# Patient Record
Sex: Female | Born: 1970
Health system: Southern US, Community
[De-identification: ages and names within clinical notes are randomized; demographics above are authoritative.]

## PROBLEM LIST (undated history)

## (undated) DIAGNOSIS — F429 Obsessive-compulsive disorder, unspecified: Secondary | ICD-10-CM

## (undated) DIAGNOSIS — K219 Gastro-esophageal reflux disease without esophagitis: Secondary | ICD-10-CM

## (undated) DIAGNOSIS — F419 Anxiety disorder, unspecified: Secondary | ICD-10-CM

## (undated) DIAGNOSIS — N200 Calculus of kidney: Secondary | ICD-10-CM

## (undated) DIAGNOSIS — G8929 Other chronic pain: Secondary | ICD-10-CM

## (undated) DIAGNOSIS — I509 Heart failure, unspecified: Secondary | ICD-10-CM

## (undated) DIAGNOSIS — R140 Abdominal distension (gaseous): Secondary | ICD-10-CM

## (undated) DIAGNOSIS — K5900 Constipation, unspecified: Secondary | ICD-10-CM

## (undated) DIAGNOSIS — K449 Diaphragmatic hernia without obstruction or gangrene: Secondary | ICD-10-CM

## (undated) DIAGNOSIS — E079 Disorder of thyroid, unspecified: Secondary | ICD-10-CM

## (undated) DIAGNOSIS — I10 Essential (primary) hypertension: Secondary | ICD-10-CM

## (undated) DIAGNOSIS — T7840XA Allergy, unspecified, initial encounter: Secondary | ICD-10-CM

## (undated) DIAGNOSIS — O903 Peripartum cardiomyopathy: Secondary | ICD-10-CM

## (undated) DIAGNOSIS — N63 Unspecified lump in unspecified breast: Secondary | ICD-10-CM

## (undated) DIAGNOSIS — K589 Irritable bowel syndrome without diarrhea: Secondary | ICD-10-CM

## (undated) DIAGNOSIS — R1013 Epigastric pain: Secondary | ICD-10-CM

## (undated) DIAGNOSIS — F32A Depression, unspecified: Secondary | ICD-10-CM

## (undated) DIAGNOSIS — G43909 Migraine, unspecified, not intractable, without status migrainosus: Secondary | ICD-10-CM

## (undated) HISTORY — DX: Diaphragmatic hernia without obstruction or gangrene: K44.9

## (undated) HISTORY — DX: Abdominal distension (gaseous): R14.0

## (undated) HISTORY — DX: Disorder of thyroid, unspecified: E07.9

## (undated) HISTORY — DX: Other chronic pain: G89.29

## (undated) HISTORY — DX: Depression, unspecified: F32.A

## (undated) HISTORY — DX: Unspecified lump in unspecified breast: N63.0

## (undated) HISTORY — PX: TUBAL LIGATION: SHX77

## (undated) HISTORY — DX: Peripartum cardiomyopathy: O90.3

## (undated) HISTORY — DX: Obsessive-compulsive disorder, unspecified: F42.9

## (undated) HISTORY — DX: Anxiety disorder, unspecified: F41.9

## (undated) HISTORY — DX: Epigastric pain: R10.13

## (undated) HISTORY — DX: Gastro-esophageal reflux disease without esophagitis: K21.9

## (undated) HISTORY — DX: Constipation, unspecified: K59.00

## (undated) HISTORY — PX: VAGINAL HYSTERECTOMY: SUR661

## (undated) HISTORY — DX: Calculus of kidney: N20.0

## (undated) HISTORY — DX: Irritable bowel syndrome, unspecified: K58.9

## (undated) HISTORY — PX: FRACTURE SURGERY: SHX138

## (undated) HISTORY — DX: Allergy, unspecified, initial encounter: T78.40XA

## (undated) HISTORY — DX: Heart failure, unspecified: I50.9

## (undated) HISTORY — PX: UPPER ENDOSCOPY W/ ESOPHAGEAL MANOMETRY: SHX2604

## (undated) HISTORY — PX: BREAST CYST ASPIRATION: SHX578

---

## 2004-01-15 DIAGNOSIS — I509 Heart failure, unspecified: Secondary | ICD-10-CM

## 2004-01-15 HISTORY — DX: Heart failure, unspecified: I50.9

## 2004-01-15 HISTORY — PX: DILATION AND CURETTAGE OF UTERUS: SHX78

## 2004-01-18 ENCOUNTER — Ambulatory Visit: Payer: Self-pay | Admitting: Obstetrics and Gynecology

## 2004-02-20 ENCOUNTER — Ambulatory Visit: Payer: Self-pay | Admitting: Obstetrics and Gynecology

## 2004-03-12 ENCOUNTER — Ambulatory Visit: Payer: Self-pay | Admitting: Obstetrics and Gynecology

## 2004-03-14 ENCOUNTER — Ambulatory Visit: Payer: Self-pay | Admitting: Obstetrics and Gynecology

## 2004-03-21 ENCOUNTER — Ambulatory Visit: Payer: Self-pay | Admitting: Obstetrics and Gynecology

## 2004-04-17 ENCOUNTER — Observation Stay: Payer: Self-pay | Admitting: Obstetrics and Gynecology

## 2004-04-17 ENCOUNTER — Ambulatory Visit: Payer: Self-pay | Admitting: Obstetrics and Gynecology

## 2004-05-02 ENCOUNTER — Ambulatory Visit: Payer: Self-pay | Admitting: Obstetrics and Gynecology

## 2004-05-27 ENCOUNTER — Observation Stay: Payer: Self-pay | Admitting: Obstetrics and Gynecology

## 2004-05-30 ENCOUNTER — Ambulatory Visit: Payer: Self-pay | Admitting: Obstetrics and Gynecology

## 2004-06-08 ENCOUNTER — Ambulatory Visit: Payer: Self-pay | Admitting: Obstetrics and Gynecology

## 2004-06-24 ENCOUNTER — Emergency Department: Payer: Self-pay | Admitting: Emergency Medicine

## 2004-07-12 ENCOUNTER — Observation Stay: Payer: Self-pay | Admitting: Obstetrics and Gynecology

## 2004-07-18 ENCOUNTER — Ambulatory Visit: Payer: Self-pay | Admitting: Obstetrics and Gynecology

## 2004-07-23 ENCOUNTER — Ambulatory Visit: Payer: Self-pay | Admitting: Obstetrics and Gynecology

## 2004-07-26 ENCOUNTER — Observation Stay: Payer: Self-pay | Admitting: Obstetrics and Gynecology

## 2004-08-10 ENCOUNTER — Inpatient Hospital Stay: Payer: Self-pay | Admitting: Obstetrics and Gynecology

## 2004-08-16 ENCOUNTER — Inpatient Hospital Stay: Payer: Self-pay | Admitting: Internal Medicine

## 2004-08-16 ENCOUNTER — Other Ambulatory Visit: Payer: Self-pay

## 2004-08-20 ENCOUNTER — Ambulatory Visit: Payer: Self-pay | Admitting: Obstetrics and Gynecology

## 2004-12-12 ENCOUNTER — Ambulatory Visit: Payer: Self-pay | Admitting: Unknown Physician Specialty

## 2005-06-18 ENCOUNTER — Ambulatory Visit: Payer: Self-pay | Admitting: Unknown Physician Specialty

## 2005-07-29 ENCOUNTER — Ambulatory Visit: Payer: Self-pay | Admitting: Unknown Physician Specialty

## 2005-08-06 ENCOUNTER — Ambulatory Visit: Payer: Self-pay | Admitting: Unknown Physician Specialty

## 2005-10-28 ENCOUNTER — Ambulatory Visit: Payer: Self-pay | Admitting: Unknown Physician Specialty

## 2006-06-17 ENCOUNTER — Ambulatory Visit: Payer: Self-pay | Admitting: Unknown Physician Specialty

## 2006-06-20 ENCOUNTER — Ambulatory Visit: Payer: Self-pay | Admitting: Unknown Physician Specialty

## 2007-11-05 ENCOUNTER — Ambulatory Visit: Payer: Self-pay | Admitting: Unknown Physician Specialty

## 2007-11-24 ENCOUNTER — Ambulatory Visit: Payer: Self-pay | Admitting: Unknown Physician Specialty

## 2008-03-31 ENCOUNTER — Ambulatory Visit: Payer: Self-pay | Admitting: Unknown Physician Specialty

## 2008-06-09 ENCOUNTER — Ambulatory Visit: Payer: Self-pay | Admitting: Psychiatry

## 2008-06-17 ENCOUNTER — Ambulatory Visit: Payer: Self-pay

## 2008-06-22 ENCOUNTER — Ambulatory Visit: Payer: Self-pay | Admitting: Unknown Physician Specialty

## 2008-10-11 ENCOUNTER — Ambulatory Visit: Payer: Self-pay | Admitting: Family

## 2008-10-21 ENCOUNTER — Ambulatory Visit: Payer: Self-pay | Admitting: Family

## 2008-10-31 ENCOUNTER — Ambulatory Visit: Payer: Self-pay | Admitting: Gastroenterology

## 2008-12-21 ENCOUNTER — Ambulatory Visit: Payer: Self-pay | Admitting: Unknown Physician Specialty

## 2008-12-22 ENCOUNTER — Ambulatory Visit: Payer: Self-pay | Admitting: Unknown Physician Specialty

## 2009-01-12 LAB — HM PAP SMEAR: HM Pap smear: NORMAL

## 2009-02-01 ENCOUNTER — Other Ambulatory Visit: Payer: Self-pay | Admitting: Emergency Medicine

## 2009-03-22 ENCOUNTER — Ambulatory Visit: Payer: Self-pay | Admitting: General Surgery

## 2009-05-19 ENCOUNTER — Ambulatory Visit: Payer: Self-pay | Admitting: General Practice

## 2009-07-11 ENCOUNTER — Ambulatory Visit: Payer: Self-pay | Admitting: General Practice

## 2009-07-28 ENCOUNTER — Ambulatory Visit: Payer: Self-pay | Admitting: Specialist

## 2009-08-18 ENCOUNTER — Ambulatory Visit: Payer: Self-pay | Admitting: Specialist

## 2009-12-26 ENCOUNTER — Ambulatory Visit: Payer: Self-pay | Admitting: General Surgery

## 2010-07-02 ENCOUNTER — Ambulatory Visit: Payer: Self-pay | Admitting: Internal Medicine

## 2010-07-07 LAB — HM PAP SMEAR: HM Pap smear: NORMAL

## 2010-07-25 ENCOUNTER — Ambulatory Visit: Payer: Self-pay | Admitting: Obstetrics and Gynecology

## 2010-07-30 ENCOUNTER — Ambulatory Visit: Payer: Self-pay | Admitting: Obstetrics and Gynecology

## 2010-08-01 LAB — PATHOLOGY REPORT

## 2010-09-03 ENCOUNTER — Telehealth: Payer: Self-pay | Admitting: Internal Medicine

## 2010-09-05 NOTE — Telephone Encounter (Signed)
If Dr. Dan Humphreys has a spot, can you put her in with her? Or have you already checked?

## 2010-09-05 NOTE — Telephone Encounter (Signed)
I have scheduled patient with Dr. Dan Humphreys for 09/13/10 at 2:30.  I am waiting for Dr. Forestine Na office to call me back to let them know of the appt.

## 2010-09-05 NOTE — Telephone Encounter (Signed)
Dr. Bridget Hartshorn office called and stated patient is having surgery on 09/18/10.  She is having a hysterectomy.  The doctor wanted to know if she could be seen for surgical clearence, but you do not have an appt. Until the day patient is scheduled for surgery.  What do you want to do about giving patient clearance.  Please advise

## 2010-09-06 ENCOUNTER — Telehealth: Payer: Self-pay | Admitting: Internal Medicine

## 2010-09-12 ENCOUNTER — Ambulatory Visit: Payer: Self-pay | Admitting: Obstetrics and Gynecology

## 2010-09-13 ENCOUNTER — Ambulatory Visit (INDEPENDENT_AMBULATORY_CARE_PROVIDER_SITE_OTHER): Payer: PRIVATE HEALTH INSURANCE | Admitting: Internal Medicine

## 2010-09-13 ENCOUNTER — Ambulatory Visit: Payer: Self-pay | Admitting: Internal Medicine

## 2010-09-13 ENCOUNTER — Encounter: Payer: Self-pay | Admitting: Internal Medicine

## 2010-09-13 VITALS — BP 125/77 | HR 74 | Temp 98.4°F | Resp 16 | Ht 63.0 in | Wt 112.0 lb

## 2010-09-13 DIAGNOSIS — R0609 Other forms of dyspnea: Secondary | ICD-10-CM

## 2010-09-13 DIAGNOSIS — R0600 Dyspnea, unspecified: Secondary | ICD-10-CM

## 2010-09-13 DIAGNOSIS — O903 Peripartum cardiomyopathy: Secondary | ICD-10-CM | POA: Insufficient documentation

## 2010-09-13 DIAGNOSIS — Z01818 Encounter for other preprocedural examination: Secondary | ICD-10-CM

## 2010-09-13 HISTORY — DX: Peripartum cardiomyopathy: O90.3

## 2010-09-13 NOTE — Progress Notes (Signed)
Subjective:    Patient ID: Connie Francis, female    DOB: 1970/12/02, 40 y.o.   MRN: [id number]  HPI Connie Francis is a 40 year old female with a history of menorrhagia who presents for preop evaluation prior to hysterectomy. She reports feeling well and currently has no complaints. Her medical history is remarkable for postpartum cardiomyopathy. She reports being followed by Connie Francis shows from cardiology. She had been asymptomatic until a recent surgical procedure after which she developed some pulmonary edema. She reports being treated with Lasix with complete resolution of her symptoms. She reports her last echo was in 2006 and showed normal function. She is not on chronic medications for heart failure. She does not have any chronic dyspnea or lower extremity edema. She denies any recent complaints of dyspnea, cough, fever, lower extremity edema, chest pain, palpitations, or other complaints.   Outpatient Encounter Prescriptions as of 09/13/2010  Medication Sig Dispense Refill  . ALPRAZolam (XANAX) 0.25 MG tablet Take 0.25 mg by mouth as needed.          Review of Systems  Constitutional: Negative for fever, chills, appetite change, fatigue and unexpected weight change.  HENT: Negative for ear pain, congestion, sore throat, trouble swallowing, neck pain, voice change and sinus pressure.   Eyes: Negative for visual disturbance.  Respiratory: Negative for cough, shortness of breath, wheezing and stridor.   Cardiovascular: Negative for chest pain, palpitations and leg swelling.  Gastrointestinal: Negative for nausea, vomiting, abdominal pain, diarrhea, constipation, blood in stool, abdominal distention and anal bleeding.  Genitourinary: Negative for dysuria and flank pain.  Musculoskeletal: Negative for myalgias, arthralgias and gait problem.  Skin: Negative for color change and rash.  Neurological: Negative for dizziness and headaches.  Hematological: Negative for adenopathy. Does not bruise/bleed  easily.  Psychiatric/Behavioral: Negative for suicidal ideas, sleep disturbance and dysphoric mood. The patient is not nervous/anxious.     BP 125/77  Pulse 74  Temp(Src) 98.4 F (36.9 C) (Oral)  Resp 16  Ht 5\' 3"  (1.6 m)  Wt 112 lb (50.803 kg)  BMI 19.84 kg/m2  SpO2 99%     Objective:   Physical Exam  Constitutional: She is oriented to person, place, and time. She appears well-developed and well-nourished. No distress.  HENT:  Head: Normocephalic and atraumatic.  Right Ear: External ear normal.  Left Ear: External ear normal.  Nose: Nose normal.  Mouth/Throat: Oropharynx is clear and moist. No oropharyngeal exudate.  Eyes: Conjunctivae are normal. Pupils are equal, round, and reactive to light. Right eye exhibits no discharge. Left eye exhibits no discharge. No scleral icterus.  Neck: Normal range of motion. Neck supple. No tracheal deviation present. No thyromegaly present.  Cardiovascular: Normal rate, regular rhythm, normal heart sounds and intact distal pulses.  Exam reveals no gallop and no friction rub.   No murmur heard. Pulmonary/Chest: Effort normal and breath sounds normal. No respiratory distress. She has no wheezes. She has no rales. She exhibits no tenderness.  Musculoskeletal: Normal range of motion. She exhibits no edema and no tenderness.  Lymphadenopathy:    She has no cervical adenopathy.  Neurological: She is alert and oriented to person, place, and time. No cranial nerve deficit. She exhibits normal muscle tone. Coordination normal.  Skin: Skin is warm and dry. No rash noted. She is not diaphoretic. No erythema. No pallor.  Psychiatric: She has a normal mood and affect. Her behavior is normal. Judgment and thought content normal.          Assessment &  Plan:  40 year old female with a history of postpartum cardiomyopathy presents for preoperative evaluation prior to hysterectomy. Hysterectomy is scheduled for next week. I have not evaluated this patient  in the past, and her previous medical records are not available today. She reports a normal cardiac echo in 2006, however recently in the setting of a surgical procedure she had pulmonary edema requiring IV Lasix. I will request records on this recent hospitalization. Based on the modified risk index she would be at low risk for perioperative events. However, given her history of pulmonary edema in the setting of anesthesia, and her history of postpartum cardiomyopathy, I think it would be prudent to repeat a cardiac echo prior to her procedure. We have scheduled her echo for tomorrow. I will also request recent labs and EKG performed yesterday at [facility]. Once this is complete, I will prepare recommendations for her surgeon.

## 2010-09-13 NOTE — Patient Instructions (Signed)
ECHO tomorrow. Follow up in 1-2 months with Dr. Darrick Huntsman.

## 2010-09-14 ENCOUNTER — Ambulatory Visit: Payer: Self-pay | Admitting: Internal Medicine

## 2010-09-14 DIAGNOSIS — I509 Heart failure, unspecified: Secondary | ICD-10-CM

## 2010-09-18 ENCOUNTER — Telehealth: Payer: Self-pay | Admitting: *Deleted

## 2010-09-18 ENCOUNTER — Ambulatory Visit: Payer: Self-pay | Admitting: Obstetrics and Gynecology

## 2010-09-18 NOTE — Telephone Encounter (Signed)
Patient is scheduled for surgery today at 11:00. Is patient cleared for surgery? Please advise.

## 2010-09-18 NOTE — Telephone Encounter (Signed)
I have not yet received ECHO on patient.

## 2010-09-20 LAB — PATHOLOGY REPORT

## 2010-09-20 NOTE — Telephone Encounter (Signed)
This has already been taken care of. Patient saw Dr. Dan Humphreys.

## 2010-09-20 NOTE — Telephone Encounter (Signed)
Patient has now had surgery. Patient notified of echo results.

## 2010-09-27 ENCOUNTER — Other Ambulatory Visit: Payer: Self-pay | Admitting: Cardiology

## 2010-09-27 DIAGNOSIS — R0600 Dyspnea, unspecified: Secondary | ICD-10-CM

## 2010-09-28 ENCOUNTER — Other Ambulatory Visit: Payer: PRIVATE HEALTH INSURANCE | Admitting: *Deleted

## 2010-10-05 ENCOUNTER — Ambulatory Visit: Payer: Self-pay | Admitting: General Practice

## 2010-11-02 ENCOUNTER — Ambulatory Visit (INDEPENDENT_AMBULATORY_CARE_PROVIDER_SITE_OTHER): Payer: PRIVATE HEALTH INSURANCE | Admitting: Internal Medicine

## 2010-11-02 ENCOUNTER — Encounter: Payer: Self-pay | Admitting: Internal Medicine

## 2010-11-02 VITALS — BP 122/80 | HR 82 | Temp 98.3°F | Ht 62.5 in | Wt 118.0 lb

## 2010-11-02 DIAGNOSIS — N6459 Other signs and symptoms in breast: Secondary | ICD-10-CM

## 2010-11-02 DIAGNOSIS — R609 Edema, unspecified: Secondary | ICD-10-CM

## 2010-11-02 MED ORDER — SPIRONOLACTONE 25 MG PO TABS: 25.0000 mg | ORAL_TABLET | Freq: Every day | ORAL | Status: DC

## 2010-11-02 NOTE — Progress Notes (Signed)
  Subjective:    Patient ID: Connie Francis, female    DOB: 1970/02/02, 40 y.o.   MRN: [id number]  HPI  40 yo white femael with a history of obsessive compulsive disorder and post partum cardiomyopathy, resolved, presents with subacute onset of breast pain, located in the left medial side.  Her pain is worse at the end of the day and resolved in the morning.  She wears underwire bras and the pain is  In the location of the underwire.   Past Medical History  Diagnosis Date  . Postpartum dilated cardiomyopathy   . Obsessive compulsive disorder    Current Outpatient Prescriptions on File Prior to Visit  Medication Sig Dispense Refill  . ALPRAZolam (XANAX) 0.25 MG tablet Take 0.25 mg by mouth as needed.             Review of Systems  Constitutional: Negative for fever, chills and unexpected weight change.  HENT: Negative for hearing loss, ear pain, nosebleeds, congestion, sore throat, facial swelling, rhinorrhea, sneezing, mouth sores, trouble swallowing, neck pain, neck stiffness, voice change, postnasal drip, sinus pressure, tinnitus and ear discharge.   Eyes: Negative for pain, discharge, redness and visual disturbance.  Respiratory: Negative for cough, chest tightness, shortness of breath, wheezing and stridor.   Cardiovascular: Negative for chest pain, palpitations and leg swelling.  Musculoskeletal: Negative for myalgias and arthralgias.  Skin: Negative for color change and rash.  Neurological: Negative for dizziness, weakness, light-headedness and headaches.  Hematological: Negative for adenopathy.       Objective:   Physical Exam  Constitutional: She is oriented to person, place, and time. She appears well-developed and well-nourished.  HENT:  Mouth/Throat: Oropharynx is clear and moist.  Eyes: EOM are normal. Pupils are equal, round, and reactive to light. No scleral icterus.  Neck: Normal range of motion. Neck supple. No JVD present. No thyromegaly present.  Cardiovascular:  Normal rate, regular rhythm, normal heart sounds and intact distal pulses.   Pulmonary/Chest: Effort normal and breath sounds normal.  Abdominal: Soft. Bowel sounds are normal. She exhibits no mass. There is no tenderness.  Genitourinary: No vaginal discharge found.  Musculoskeletal: Normal range of motion. She exhibits no edema.  Lymphadenopathy:    She has no cervical adenopathy.  Neurological: She is alert and oriented to person, place, and time.  Skin: Skin is warm and dry.  Psychiatric: She has a normal mood and affect.          Assessment & Plan:  Breast pain:  She has a normal breast exam.  I have recommended she stop wearing under wire bra nad use spironolactone one week per month foe the fluid retention that is aggravating her symptoms.

## 2010-11-04 ENCOUNTER — Encounter: Payer: Self-pay | Admitting: Internal Medicine

## 2010-11-04 DIAGNOSIS — F429 Obsessive-compulsive disorder, unspecified: Secondary | ICD-10-CM | POA: Insufficient documentation

## 2011-04-23 ENCOUNTER — Other Ambulatory Visit: Payer: Self-pay | Admitting: Internal Medicine

## 2011-04-23 ENCOUNTER — Ambulatory Visit (INDEPENDENT_AMBULATORY_CARE_PROVIDER_SITE_OTHER): Payer: PRIVATE HEALTH INSURANCE | Admitting: Internal Medicine

## 2011-04-23 ENCOUNTER — Encounter: Payer: Self-pay | Admitting: Internal Medicine

## 2011-04-23 VITALS — BP 110/68 | HR 73 | Temp 98.1°F | Resp 16 | Ht 62.5 in | Wt 117.5 lb

## 2011-04-23 DIAGNOSIS — F429 Obsessive-compulsive disorder, unspecified: Secondary | ICD-10-CM

## 2011-04-23 DIAGNOSIS — O903 Peripartum cardiomyopathy: Secondary | ICD-10-CM

## 2011-04-23 DIAGNOSIS — F430 Acute stress reaction: Secondary | ICD-10-CM

## 2011-04-23 DIAGNOSIS — N6009 Solitary cyst of unspecified breast: Secondary | ICD-10-CM

## 2011-04-23 DIAGNOSIS — R4589 Other symptoms and signs involving emotional state: Secondary | ICD-10-CM

## 2011-04-23 MED ORDER — PROPRANOLOL HCL 20 MG PO TABS: 20.0000 mg | ORAL_TABLET | Freq: Three times a day (TID) | ORAL | Status: DC

## 2011-04-23 MED ORDER — ALPRAZOLAM 0.25 MG PO TABS: 0.2500 mg | ORAL_TABLET | ORAL | Status: DC | PRN

## 2011-04-23 NOTE — Progress Notes (Signed)
Patient ID: Connie Francis, female   DOB: 12-22-1970, 41 y.o.   MRN: [id number] Patient Active Problem List  Diagnoses  . Postpartum cardiomyopathy  . Obsessive compulsive disorder  . Breast cyst  . Anxiety in acute stress reaction    Subjective:  CC:   Chief Complaint  Patient presents with  . Annual Exam    HPI:   Connie Cainis a 41 y.o. female who presents Presents for her annual exam. She is status post recent hysterectomy several months ago and and has followed up with her gynecologist since then.  She complained today is anxiety. 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 She has significant test taking anxiety. She has had prior testing for ADD which was negative,  however her therapist has suggested that she try Ritalin anyway for her increased concentration.  she has a history of cardiomyopathy and does not think this is a good idea. I happen to agree.   during her last attempt to pass she did try taking metoprolol or Toprol while she was sitting but never during the test. She is not sure what dose she tried. She did not have any adverse effects from. She did not really feel that it helped.  She's had no recent shortness of breath chest pain, palpitations, or changes in bowel or bladder habits. She is sleeping well. She is up-to-date on screening. She reportsno domestic violence at home. She wears her seatbelt when she drives.    Past Medical History  Diagnosis Date  . Postpartum dilated cardiomyopathy   . Obsessive compulsive disorder   . Breast cyst     left breast, follow up with Evette Cristal    Past Surgical History  Procedure Date  . Dilation and curettage of uterus 2012    complicated by heart failure  . Abdominal hysterectomy 2012         The following portions of the patient's history were reviewed and updated as appropriate: Allergies, current medications, and problem  list.    Review of Systems:   12 Pt  review of systems was negative except those addressed in the HPI,     History   Social History  . Marital Status: Married    Spouse Name: N/A    Number of Children: N/A  . Years of Education: N/A   Occupational History  . Not on file.   Social History Main Topics  . Smoking status: Never Smoker   . Smokeless tobacco: Never Used  . Alcohol Use: 0.6 oz/week    1 Glasses of wine per week     socially  . Drug Use: Not on file  . Sexually Active: No   Other Topics Concern  . Not on file   Social History Narrative  . No narrative on file    Objective:  BP 110/68  Pulse 73  Temp(Src) 98.1 F (36.7 C) (Oral)  Resp 16  Ht 5' 2.5" (1.588 m)  Wt 117 lb 8 oz (53.298 kg)  BMI 21.15 kg/m2  SpO2 98%  General appearance: alert, cooperative and appears stated age Ears: normal TM's and external ear canals both ears Throat: lips, mucosa, and tongue normal; teeth and gums normal Neck: no adenopathy, no carotid bruit, supple, symmetrical, trachea midline and thyroid not enlarged, symmetric, no tenderness/mass/nodules Back: symmetric, no curvature. ROM normal. No CVA tenderness. Lungs: clear to auscultation bilaterally Heart: regular  rate and rhythm, S1, S2 normal, no murmur, click, rub or gallop Abdomen: soft, non-tender; bowel sounds normal; no masses,  no organomegaly Pulses: 2+ and symmetric Skin: Skin color, texture, turgor normal. No rashes or lesions Lymph nodes: Cervical, supraclavicular, and axillary nodes normal.  Assessment and Plan:  Breast cyst She has had prior mammograms and breast biopsies which were normal. Repeat mammogram at age 43.  Obsessive compulsive disorder Currently controlled with when necessary alprazolam. She is not on an SSRI.  Postpartum cardiomyopathy She is currently asymptomatic. She did have flash pulmonary edema after a laparoscopic procedure last year but has had a repeat echo which was  normal.  Anxiety in acute stress reaction We discussed repeating a trial of propanolol, a shorter acting beta blocker than the one she previously attempted to use for management of test taking anxiety.  She was planning to use alprazolam .  I recommended that she start with 20 mg during a mock test environment and increase to 40 mg if tolerated    Updated Medication List Outpatient Encounter Prescriptions as of 04/23/2011  Medication Sig Dispense Refill  . ALPRAZolam (XANAX) 0.25 MG tablet Take 1 tablet (0.25 mg total) by mouth as needed.  30 tablet  1  . DISCONTD: ALPRAZolam (XANAX) 0.25 MG tablet Take 0.25 mg by mouth as needed.        Marland Kitchen DISCONTD: propranolol (INDERAL) 20 MG tablet Take 1 tablet (20 mg total) by mouth 3 (three) times daily.  30 tablet  1  . DISCONTD: spironolactone (ALDACTONE) 25 MG tablet Take 1 tablet (25 mg total) by mouth daily.  30 tablet  11     No orders of the defined types were placed in this encounter.    No Follow-up on file.

## 2011-04-24 DIAGNOSIS — F411 Generalized anxiety disorder: Secondary | ICD-10-CM | POA: Insufficient documentation

## 2011-04-24 NOTE — Assessment & Plan Note (Signed)
She has had prior mammograms and breast biopsies which were normal. Repeat mammogram at age 41.

## 2011-04-24 NOTE — Assessment & Plan Note (Signed)
Currently controlled with when necessary alprazolam. She is not on an SSRI.

## 2011-04-24 NOTE — Assessment & Plan Note (Signed)
We discussed repeating a trial of propanolol, a shorter acting beta blocker than the one she previously attempted to use for management of test taking anxiety.  She was planning to use alprazolam .  I recommended that she start with 20 mg during a mock test environment and increase to 40 mg if tolerated

## 2011-04-24 NOTE — Assessment & Plan Note (Signed)
She is currently asymptomatic. She did have flash pulmonary edema after a laparoscopic procedure last year but has had a repeat echo which was normal.

## 2011-07-23 ENCOUNTER — Encounter: Payer: Self-pay | Admitting: Internal Medicine

## 2011-07-23 ENCOUNTER — Emergency Department: Payer: Self-pay | Admitting: Emergency Medicine

## 2011-07-23 ENCOUNTER — Ambulatory Visit (INDEPENDENT_AMBULATORY_CARE_PROVIDER_SITE_OTHER): Payer: PRIVATE HEALTH INSURANCE | Admitting: Internal Medicine

## 2011-07-23 VITALS — BP 122/74 | HR 82 | Temp 99.0°F | Resp 16 | Wt 111.5 lb

## 2011-07-23 DIAGNOSIS — J029 Acute pharyngitis, unspecified: Secondary | ICD-10-CM

## 2011-07-23 MED ORDER — LEVOFLOXACIN 500 MG PO TABS: 500.0000 mg | ORAL_TABLET | Freq: Every day | ORAL | Status: DC

## 2011-07-23 MED ORDER — METHYLPREDNISOLONE ACETATE 40 MG/ML IJ SUSP
40.0000 mg | Freq: Once | INTRAMUSCULAR | Status: AC
  Administered 2011-07-23: 40 mg via INTRAMUSCULAR

## 2011-07-23 MED ORDER — PREDNISONE (PAK) 10 MG PO TABS: ORAL_TABLET | ORAL | Status: DC

## 2011-07-23 NOTE — Progress Notes (Signed)
Patient ID: Connie Francis, female   DOB: Nov 25, 1970, 41 y.o.   MRN: [id number]  Patient Active Problem List  Diagnosis  . Postpartum cardiomyopathy  . Obsessive compulsive disorder  . Breast cyst  . Anxiety in acute stress reaction  . Pharyngitis    Subjective:  CC:   Chief Complaint  Patient presents with  . Sore Throat    HPI:   Connie Cainis a 41 y.o. female who presents with Sore throat, trouble swallowing .  Started 5 days ago on Thursday, after multiple sick contacgts with strep pharngitis both at work and with children at [employer] last week.  Low grade fevers,  Friday went to [facility] for presumed strep .  Treated with z pack,  Robitussin , mucinex for URI cough .  Yesterday felt like food was getting stuck in throat and throught felt like it was closing up, worse today  . Sent to ER but came here instead. No fevers currently . Cough is nonproductive .   Past Medical History  Diagnosis Date  . Postpartum dilated cardiomyopathy   . Obsessive compulsive disorder   . Breast cyst     left breast, follow up with Evette Cristal    Past Surgical History  Procedure Date  . Dilation and curettage of uterus 2012    complicated by heart failure  . Abdominal hysterectomy 2012       . methylPREDNISolone acetate  40 mg Intramuscular Once     The following portions of the patient's history were reviewed and updated as appropriate: Allergies, current medications, and problem list.    Review of Systems:   12 Pt  review of systems was negative except those addressed in the HPI,     History   Social History  . Marital Status: Married    Spouse Name: N/A    Number of Children: N/A  . Years of Education: N/A   Occupational History  . Not on file.   Social History Main Topics  . Smoking status: Never Smoker   . Smokeless tobacco: Never Used  . Alcohol Use: 0.6 oz/week    1 Glasses of wine per week     socially  . Drug Use: Not on file  . Sexually Active: No    Other Topics Concern  . Not on file   Social History Narrative  . No narrative on file    Objective:  BP 122/74  Pulse 82  Temp 99 F (37.2 C) (Oral)  Resp 16  Wt 111 lb 8 oz (50.576 kg)  SpO2 95%  General appearance: alert, cooperative and appears stated age Ears: normal TM's and external ear canals both ears Throat: lips, mucosa, and tongue normal; teeth and gums normal Neck: no adenopathy, no carotid bruit, supple, symmetrical, trachea midline and thyroid not enlarged, symmetric, no tenderness/mass/nodules Back: symmetric, no curvature. ROM normal. No CVA tenderness. Lungs: clear to auscultation bilaterally Heart: regular rate and rhythm, S1, S2 normal, no murmur, click, rub or gallop Abdomen: soft, non-tender; bowel sounds normal; no masses,  no organomegaly Pulses: 2+ and symmetric Skin: Skin color, texture, turgor normal. No rashes or lesions Lymph nodes: Cervical, supraclavicular, and axillary nodes normal.  Assessment and Plan:  Pharyngitis Empiric levaquin, steorid injection,. Steroid taper.  Salt water gargles. Strep test negative   Updated Medication List Outpatient Encounter Prescriptions as of 07/23/2011  Medication Sig Dispense Refill  . ALPRAZolam (XANAX) 0.25 MG tablet Take 1 tablet (0.25 mg total) by mouth as needed.  30  tablet  1  . levofloxacin (LEVAQUIN) 500 MG tablet Take 1 tablet (500 mg total) by mouth daily.  5 tablet  0  . predniSONE (STERAPRED UNI-PAK) 10 MG tablet 6 tablets on day 1, decrease by tablet daily until gone  21 tablet  0  . DISCONTD: propranolol (INDERAL) 20 MG tablet Take 1 tablet (20 mg total) by mouth 3 (three) times daily.  90 tablet  1   Facility-Administered Encounter Medications as of 07/23/2011  Medication Dose Route Frequency Provider Last Rate Last Dose  . methylPREDNISolone acetate (DEPO-MEDROL) injection 40 mg  40 mg Intramuscular Once Sherlene Shams, MD   40 mg at 07/23/11 1510     No orders of the defined types were  placed in this encounter.    No Follow-up on file.

## 2011-07-23 NOTE — Patient Instructions (Signed)
I am starting you on a prednisone taper to help with the inflammation.  If yo are not better in 24 hrs or if you spine a fever,  Start the Levaquin (antibiotic)  Use simply saline (sterile salt water solution)  to flush your sinuses two times daily (You need to snort it up both sides to flush correctly.  Do it over the sink)  You may also gargle with salt water to relieve swelling

## 2011-07-24 ENCOUNTER — Encounter: Payer: Self-pay | Admitting: Internal Medicine

## 2011-07-24 DIAGNOSIS — J029 Acute pharyngitis, unspecified: Secondary | ICD-10-CM | POA: Insufficient documentation

## 2011-07-24 NOTE — Assessment & Plan Note (Signed)
Empiric levaquin, steorid injection,. Steroid taper.  Salt water gargles. Strep test negative

## 2011-07-27 ENCOUNTER — Ambulatory Visit: Payer: Self-pay | Admitting: Physician Assistant

## 2011-07-27 LAB — CBC WITH DIFFERENTIAL/PLATELET
Eosinophil %: 0 %
HCT: 39.7 % (ref 35.0–47.0)
Lymphocyte #: 1.4 10*3/uL (ref 1.0–3.6)
Lymphocyte %: 19 %
MCH: 30.6 pg (ref 26.0–34.0)
MCHC: 33.8 g/dL (ref 32.0–36.0)
MCV: 91 fL (ref 80–100)
Monocyte #: 0.5 x10 3/mm (ref 0.2–0.9)
Monocyte %: 6.2 %
Neutrophil #: 5.6 10*3/uL (ref 1.4–6.5)
Platelet: 276 10*3/uL (ref 150–440)
RBC: 4.39 10*6/uL (ref 3.80–5.20)

## 2011-07-27 LAB — MONONUCLEOSIS SCREEN: Mono Test: NEGATIVE

## 2011-07-29 ENCOUNTER — Emergency Department: Payer: Self-pay | Admitting: *Deleted

## 2011-07-30 ENCOUNTER — Emergency Department: Payer: Self-pay | Admitting: Emergency Medicine

## 2011-07-30 LAB — COMPREHENSIVE METABOLIC PANEL
Albumin: 3.9 g/dL (ref 3.4–5.0)
Anion Gap: 9 (ref 7–16)
BUN: 12 mg/dL (ref 7–18)
Calcium, Total: 8.9 mg/dL (ref 8.5–10.1)
Chloride: 100 mmol/L (ref 98–107)
Creatinine: 0.73 mg/dL (ref 0.60–1.30)
Glucose: 88 mg/dL (ref 65–99)
Osmolality: 275 (ref 275–301)
Potassium: 3.3 mmol/L — ABNORMAL LOW (ref 3.5–5.1)
SGOT(AST): 15 U/L (ref 15–37)
SGPT (ALT): 20 U/L

## 2011-07-30 LAB — URINALYSIS, COMPLETE
Bacteria: NONE SEEN
Glucose,UR: NEGATIVE mg/dL (ref 0–75)
Nitrite: NEGATIVE
Ph: 6 (ref 4.5–8.0)
Protein: NEGATIVE
Specific Gravity: 1.005 (ref 1.003–1.030)
Squamous Epithelial: <1

## 2011-07-30 LAB — CBC
MCHC: 32.7 g/dL (ref 32.0–36.0)
MCV: 90 fL (ref 80–100)
RBC: 4.44 10*6/uL (ref 3.80–5.20)
RDW: 12.3 % (ref 11.5–14.5)
WBC: 10.1 10*3/uL (ref 3.6–11.0)

## 2011-07-31 ENCOUNTER — Encounter: Payer: Self-pay | Admitting: Internal Medicine

## 2011-07-31 ENCOUNTER — Ambulatory Visit (INDEPENDENT_AMBULATORY_CARE_PROVIDER_SITE_OTHER): Payer: PRIVATE HEALTH INSURANCE | Admitting: Internal Medicine

## 2011-07-31 ENCOUNTER — Other Ambulatory Visit: Payer: Self-pay | Admitting: Internal Medicine

## 2011-07-31 VITALS — BP 102/72 | HR 65 | Temp 97.5°F | Ht 62.5 in | Wt 109.0 lb

## 2011-07-31 DIAGNOSIS — E060 Acute thyroiditis: Secondary | ICD-10-CM | POA: Insufficient documentation

## 2011-07-31 DIAGNOSIS — E876 Hypokalemia: Secondary | ICD-10-CM

## 2011-07-31 DIAGNOSIS — E069 Thyroiditis, unspecified: Secondary | ICD-10-CM

## 2011-07-31 LAB — BASIC METABOLIC PANEL
Anion Gap: 8 (ref 7–16)
BUN: 9 mg/dL (ref 7–18)
Chloride: 103 mmol/L (ref 98–107)
EGFR (African American): >60
EGFR (Non-African Amer.): >60
Glucose: 95 mg/dL (ref 65–99)
Osmolality: 283 (ref 275–301)

## 2011-07-31 LAB — MAGNESIUM: Magnesium: 2.4 mg/dL

## 2011-07-31 MED ORDER — POTASSIUM CHLORIDE CRYS ER 10 MEQ PO TBCR: 20.0000 meq | EXTENDED_RELEASE_TABLET | Freq: Two times a day (BID) | ORAL | Status: DC

## 2011-07-31 MED ORDER — PROPRANOLOL HCL 20 MG PO TABS: 20.0000 mg | ORAL_TABLET | Freq: Three times a day (TID) | ORAL | Status: DC

## 2011-07-31 NOTE — Assessment & Plan Note (Signed)
Suggested by recent streptococcal infection followed by tachycardia and suppressed TSH with very mild left cervical adenopathy without thyroid pain. T3-T4 and thyroglobulin antibodies ordered as well as a thyroid scan and endocrinology referral to [facility]. We are changing the Toprol-XL to propanolol 20 mg 3 times a day when necessary as the Toprol is causing her feel very tired and groggy today.

## 2011-07-31 NOTE — Progress Notes (Signed)
Patient ID: Connie Francis, female   DOB: 08-30-1970, 41 y.o.   MRN: [id number] Patient Active Problem List  Diagnosis  . Postpartum cardiomyopathy  . Obsessive compulsive disorder  . Breast cyst  . Anxiety in acute stress reaction  . Pharyngitis  . Thyroiditis, subacute    Subjective:  CC:   Chief Complaint  Patient presents with  . Loss of Consciousness    near syncope episode yesterday at work-went to ER  . Hypertension    elevated BP  . Weight Loss    HPI:   Connie Francis a 41 y.o. female who presents with history concerning for subacute thyroiditis. she was recently treated for streptococcal  pharyngitis accompanied by dysphagia which persisted for several days and was accompanied by extreme anxiety. She was seen in his office and given a steroid taper and instructions to repeat a course of antibiotics if she if she spikes another fever which she never did. She woke up 3 days ago feeling diaphoretic and add/tremulous and noted that her blood pressure was 150/90 .   she went to the ER for persistent neck pain and x-ray EKG were normal she was seen by ENT on Monday .  Connie Francis evaluate her throat with a laryngoscope, saw  inflammation consistent with reflux esophagitis/laryngitis and prescribed 40 mg of Prilosec daily. She started Prilosec yesterday. Today while driving in the hospital she developed a knot in her throat accompanied by tachycardia and presyncope. .  She returned to the ER  and herb pulse was 120 and bp was 150/97.   she had labs done yesterday in the ER which were notable for a potassium of 3.3, normal CBC cardiac enzymes  and a suppressedTSH of 0.299.  She was given a prescription for Toprol-XL 25 mg which she started yesterday. She has not had anymore episodes of tachycardia but feels very tired now .  She does have a history of borderline overactive thyroid in the past which was evaluated remotely by Dr. Johny Francis     Past Medical History  Diagnosis Date  .  Postpartum dilated cardiomyopathy   . Obsessive compulsive disorder   . Breast cyst     left breast, follow up with Connie Francis    Past Surgical History  Procedure Date  . Dilation and curettage of uterus 2012    complicated by heart failure  . Abdominal hysterectomy 2012     The following portions of the patient's history were reviewed and updated as appropriate: Allergies, current medications, and problem list.    Review of Systems:   12 Pt  review of systems was negative except those addressed in the HPI,     History   Social History  . Marital Status: Married    Spouse Name: N/A    Number of Children: N/A  . Years of Education: N/A   Occupational History  . Not on file.   Social History Main Topics  . Smoking status: Never Smoker   . Smokeless tobacco: Never Used  . Alcohol Use: 0.6 oz/week    1 Glasses of wine per week     socially  . Drug Use: Not on file  . Sexually Active: No   Other Topics Concern  . Not on file   Social History Narrative  . No narrative on file    Objective:  BP 102/72  Pulse 65  Temp 97.5 F (36.4 C) (Oral)  Ht 5' 2.5" (1.588 m)  Wt 109 lb (49.442 kg)  BMI 19.62  kg/m2  SpO2 97%  General appearance: alert, cooperative and appears stated age Ears: normal TM's and external ear canals both ears Throat: lips, mucosa, and tongue normal; teeth and gums normal Neck: tender over left thyroid lobe and left jugular area. No masses or nodules Lungs: clear to auscultation bilaterally Heart: regular rate and rhythm, S1, S2 normal, no murmur, click, rub or gallop Abdomen: soft, non-tender; bowel sounds normal; no masses,  no organomegaly Pulses: 2+ and symmetric Skin: Skin color, texture, turgor normal. No rashes or lesions Lymph nodes: Cervical, supraclavicular, and axillary nodes normal.  Assessment and Plan:  Thyroiditis, subacute Suggested by recent streptococcal infection followed by tachycardia and suppressed TSH with very  mild left cervical adenopathy without thyroid pain. T3-T4 and thyroglobulin antibodies ordered as well as a thyroid scan and endocrinology referral to [facility]. We are changing the Toprol-XL to propanolol 20 mg 3 times a day when necessary as the Toprol is causing her feel very tired and groggy today.   Updated Medication List Outpatient Encounter Prescriptions as of 07/31/2011  Medication Sig Dispense Refill  . ALPRAZolam (XANAX) 0.25 MG tablet Take 1 tablet (0.25 mg total) by mouth as needed.  30 tablet  1  . omeprazole (PRILOSEC) 40 MG capsule Take 40 mg by mouth daily. 30 minutes before dinner      . DISCONTD: metoprolol succinate (TOPROL-XL) 25 MG 24 hr tablet Take 25 mg by mouth 2 (two) times daily.      . potassium chloride (K-DUR,KLOR-CON) 10 MEQ tablet Take 2 tablets (20 mEq total) by mouth 2 (two) times daily.  4 tablet  0  . propranolol (INDERAL) 20 MG tablet Take 1 tablet (20 mg total) by mouth 3 (three) times daily.  60 tablet  3  . DISCONTD: levofloxacin (LEVAQUIN) 500 MG tablet Take 1 tablet (500 mg total) by mouth daily.  5 tablet  0  . DISCONTD: predniSONE (STERAPRED UNI-PAK) 10 MG tablet 6 tablets on day 1, decrease by tablet daily until gone  21 tablet  0     Orders Placed This Encounter  Procedures  . NM Thyroid Scan/Uptake 24 Hr  . T4, free  . Thyroglobulin Antibody Panel  . Magnesium  . Basic metabolic panel  . Ambulatory referral to Endocrinology    No Follow-up on file.

## 2011-08-01 ENCOUNTER — Telehealth: Payer: Self-pay | Admitting: Internal Medicine

## 2011-08-01 NOTE — Telephone Encounter (Signed)
Caller: Connie Francis/Patient; PCP: Duncan Dull; CB#: [phone]; ; ; Call regarding Neck Pain, Head Feels Funny; Pt. has recent dx. of Hyperthyroidism.  Reports now that her neck hurts with movement.  Also reports her head feels "foggy", and feels lightheaded when standing for a while.  Pt. also c/o intermittent numbness in L hand.  No Chest pain, or palpitations.   Triaged with Dizziness or vertigo, and Neck Pain and emergent sx. r/o per guidelines.  Disposition:  See Provider within 24 hours for  previously evaluated and worsening sx. interfering with the ability to carry out activities of daily living.  Care instructions given.  Pt. instructed to contact office in am to get appt.  No appointments available in EPIC.   DB/CAN

## 2011-08-02 ENCOUNTER — Emergency Department: Payer: Self-pay | Admitting: Emergency Medicine

## 2011-08-02 LAB — COMPREHENSIVE METABOLIC PANEL
Albumin: 3.8 g/dL (ref 3.4–5.0)
Anion Gap: 5 — ABNORMAL LOW (ref 7–16)
BUN: 6 mg/dL — ABNORMAL LOW (ref 7–18)
Bilirubin,Total: 0.4 mg/dL (ref 0.2–1.0)
Co2: 32 mmol/L (ref 21–32)
Creatinine: 0.63 mg/dL (ref 0.60–1.30)
EGFR (African American): >60
Glucose: 87 mg/dL (ref 65–99)
SGOT(AST): 19 U/L (ref 15–37)
SGPT (ALT): 18 U/L
Sodium: 141 mmol/L (ref 136–145)
Total Protein: 7.3 g/dL (ref 6.4–8.2)

## 2011-08-02 LAB — CBC
MCH: 29.7 pg (ref 26.0–34.0)
MCV: 92 fL (ref 80–100)
Platelet: 264 10*3/uL (ref 150–440)
RDW: 12.6 % (ref 11.5–14.5)
WBC: 10.5 10*3/uL (ref 3.6–11.0)

## 2011-08-05 NOTE — Telephone Encounter (Signed)
Patient called this morning stating that she never received a phone call back from [place] last week.  She stated that she went to [facility] ED and was seen and treated for panic attacks.  She wanted to know if she should be seen and if so when?  I offered patient an appt on Wednesday but she declined she stated that she has an appt with Endocrinology on Wednesday afternoon as well as an appt with Gastroenterology on Wednesday.  Please advise.

## 2011-08-05 NOTE — Telephone Encounter (Signed)
Patient advised as instructed via telephone, copy of labs faxed to Dr. Tedd Sias at [facility].

## 2011-08-05 NOTE — Telephone Encounter (Signed)
jher labs were normal,  Nothing to suggest thyroiditis.  Please send copies of labs to endocrine with note

## 2011-08-09 ENCOUNTER — Ambulatory Visit: Payer: Self-pay | Admitting: Gastroenterology

## 2011-08-09 ENCOUNTER — Telehealth: Payer: Self-pay | Admitting: Internal Medicine

## 2011-08-09 NOTE — Telephone Encounter (Signed)
Per Emergent Line: Caller: Connie Francis/Patient; PCP: Duncan Dull; CB#: [phone]. Call regarding Heart Fluttering, Felling Like She Is Going To [employer]  THE PATIENT REFUSED 911. Caller reports she was dx'd with hyperthyroidism a week ago and has testing pending. Caller has had heart palpitations and fluttering for the past 3 weeks. She is taking Propanolol 20 mg tid.  She feels better in the am's with sxs worsening in afternoon and ongoing until bedtime. Caller is out shopping and felt as if she was going to pass out. Better after she sat down and took some fluids. Now feels a little nervous. Caller advised to take a prn Xanax as soon as possible. Caller asking for direction, wondering if she needs meds adjusted. Emergent Sxs ruled out per Dizziness Protocol. Home care for sxs given. No open appts in office this afternoon. Caller advised a note will be sent for PCP and advised to callback prn. She is agreeable. PLEASE CALL [name] RE CURRENT SXS.

## 2011-08-09 NOTE — Telephone Encounter (Signed)
PATIENT NOTIFIED.

## 2011-08-09 NOTE — Telephone Encounter (Signed)
Without knowing what her blood pressure and pulse is acaully doing,  It is difficult for me to advise her on increasing the propranolol dose,  She is currently taking 20 mg tid,  She can try increasing it to 40 mg tid an see if that helps. Her tyhroid studies were normal.

## 2011-08-12 ENCOUNTER — Encounter: Payer: Self-pay | Admitting: Internal Medicine

## 2011-08-12 ENCOUNTER — Ambulatory Visit: Payer: Self-pay | Admitting: Gastroenterology

## 2011-08-13 ENCOUNTER — Encounter: Payer: Self-pay | Admitting: Internal Medicine

## 2011-08-13 ENCOUNTER — Ambulatory Visit (INDEPENDENT_AMBULATORY_CARE_PROVIDER_SITE_OTHER): Payer: PRIVATE HEALTH INSURANCE | Admitting: Internal Medicine

## 2011-08-13 ENCOUNTER — Ambulatory Visit: Payer: Self-pay | Admitting: Internal Medicine

## 2011-08-13 VITALS — BP 140/74 | HR 80 | Temp 98.7°F | Resp 18 | Wt 108.5 lb

## 2011-08-13 DIAGNOSIS — F411 Generalized anxiety disorder: Secondary | ICD-10-CM

## 2011-08-13 DIAGNOSIS — I429 Cardiomyopathy, unspecified: Secondary | ICD-10-CM

## 2011-08-13 DIAGNOSIS — R4589 Other symptoms and signs involving emotional state: Secondary | ICD-10-CM

## 2011-08-13 DIAGNOSIS — E060 Acute thyroiditis: Secondary | ICD-10-CM

## 2011-08-13 DIAGNOSIS — I428 Other cardiomyopathies: Secondary | ICD-10-CM

## 2011-08-13 MED ORDER — CITALOPRAM HYDROBROMIDE 10 MG PO TABS: 10.0000 mg | ORAL_TABLET | Freq: Every day | ORAL | Status: DC

## 2011-08-13 NOTE — Patient Instructions (Addendum)
You can either reduce the propranol to 1/2 tab every 8 hours or wait until you have a run of SVT and take the whole dose   Start the celexa at 1/2 tablet daily with food  Use the alprazolam when you feel panicky   ECHO and CXR to evaluate heart and cough

## 2011-08-14 ENCOUNTER — Telehealth: Payer: Self-pay | Admitting: Internal Medicine

## 2011-08-14 NOTE — Telephone Encounter (Signed)
Pt called checking on her echocardgram    Pt needs this at [facility]  Please advise

## 2011-08-14 NOTE — Progress Notes (Signed)
Patient ID: Connie Francis, female   DOB: May 24, 1970, 41 y.o.   MRN: [id number]   Patient Active Problem List  Diagnosis  . Postpartum cardiomyopathy  . Obsessive compulsive disorder  . Breast cyst  . Anxiety in acute stress reaction  . Thyroiditis, acute    Subjective:  CC:   Chief Complaint  Patient presents with  . Palpitations  . Cough    HPI:   Connie Cainis a 41 y.o. female who presents for follow up on thyroiditis occurring after a streptococcal pharyngitis infection   Was seen by Dr. Tedd Sias, no changes to medications,  Repeat testing ordered for in the future.  Patient still having episodes of rapid heart rate and presyncope but less frequent.  Remains very fatigued by minimal activity and very frustrated by symptoms and inability to participate in daily activities of family and children.    Past Medical History  Diagnosis Date  . Postpartum dilated cardiomyopathy   . Obsessive compulsive disorder   . Breast cyst     left breast, follow up with Evette Cristal    Past Surgical History  Procedure Date  . Dilation and curettage of uterus 2012    complicated by heart failure  . Abdominal hysterectomy 2012         The following portions of the patient's history were reviewed and updated as appropriate: Allergies, current medications, and problem list.    Review of Systems:   12 Pt  review of systems was negative except those addressed in the HPI,     History   Social History  . Marital Status: Married    Spouse Name: N/A    Number of Children: N/A  . Years of Education: N/A   Occupational History  . Not on file.   Social History Main Topics  . Smoking status: Never Smoker   . Smokeless tobacco: Never Used  . Alcohol Use: 0.6 oz/week    1 Glasses of wine per week     socially  . Drug Use: Not on file  . Sexually Active: No   Other Topics Concern  . Not on file   Social History Narrative  . No narrative on file    Objective:  BP 140/74  Pulse 80   Temp 98.7 F (37.1 C) (Oral)  Resp 18  Wt 108 lb 8 oz (49.215 kg)  SpO2 97%  General appearance: alert, cooperative and appears stated age Ears: normal TM's and external ear canals both ears Throat: lips, mucosa, and tongue normal; teeth and gums normal Neck: no adenopathy, no carotid bruit, supple, symmetrical, trachea midline and thyroid not enlarged, symmetric, no tenderness/mass/nodules Back: symmetric, no curvature. ROM normal. No CVA tenderness. Lungs: clear to auscultation bilaterally Heart: regular rate and rhythm, S1, S2 normal, no murmur, click, rub or gallop Abdomen: soft, non-tender; bowel sounds normal; no masses,  no organomegaly Pulses: 2+ and symmetric Skin: Skin color, texture, turgor normal. No rashes or lesions Lymph nodes: Cervical, supraclavicular, and axillary nodes normal.  Assessment and Plan:  Thyroiditis, acute Post streptococcal.  Not tolerating propanalol dose .  Discussed reducing scheduled dose to 1/2 of current dose or using it prn full dose.   Anxiety in acute stress reaction Adding celexa and prn alprazolam   Updated Medication List Outpatient Encounter Prescriptions as of 08/13/2011  Medication Sig Dispense Refill  . ALPRAZolam (XANAX) 0.25 MG tablet Take 1 tablet (0.25 mg total) by mouth as needed.  30 tablet  1  . citalopram (CELEXA) 10 MG  tablet Take 1 tablet (10 mg total) by mouth daily.  30 tablet  1  . DISCONTD: omeprazole (PRILOSEC) 40 MG capsule Take 40 mg by mouth daily. 30 minutes before dinner      . DISCONTD: potassium chloride (K-DUR,KLOR-CON) 10 MEQ tablet Take 2 tablets (20 mEq total) by mouth 2 (two) times daily.  4 tablet  0  . DISCONTD: propranolol (INDERAL) 20 MG tablet Take 1 tablet (20 mg total) by mouth 3 (three) times daily.  60 tablet  3     Orders Placed This Encounter  Procedures  . DG Chest 2 View  . 2D Echocardiogram without contrast    No Follow-up on file.

## 2011-08-14 NOTE — Assessment & Plan Note (Signed)
Adding celexa and prn alprazolam

## 2011-08-14 NOTE — Assessment & Plan Note (Signed)
Post streptococcal.  Not tolerating propanalol dose .  Discussed reducing scheduled dose to 1/2 of current dose or using it prn full dose.

## 2011-08-15 ENCOUNTER — Ambulatory Visit: Payer: Self-pay | Admitting: Internal Medicine

## 2011-08-16 ENCOUNTER — Telehealth: Payer: Self-pay | Admitting: Internal Medicine

## 2011-08-16 DIAGNOSIS — R55 Syncope and collapse: Secondary | ICD-10-CM

## 2011-08-16 NOTE — Telephone Encounter (Signed)
The thyroid uptake scan was normal.

## 2011-08-16 NOTE — Telephone Encounter (Signed)
Patient notified. She goes for her ECHO on Monday .

## 2011-08-16 NOTE — Telephone Encounter (Signed)
Chest x ray was normal.  Has her ECHO been set up yet?

## 2011-08-19 ENCOUNTER — Ambulatory Visit: Payer: Self-pay | Admitting: Internal Medicine

## 2011-08-19 DIAGNOSIS — I428 Other cardiomyopathies: Secondary | ICD-10-CM

## 2011-08-20 ENCOUNTER — Other Ambulatory Visit: Payer: Self-pay | Admitting: Internal Medicine

## 2011-08-20 DIAGNOSIS — I429 Cardiomyopathy, unspecified: Secondary | ICD-10-CM

## 2011-08-21 ENCOUNTER — Other Ambulatory Visit: Payer: Self-pay

## 2011-08-21 LAB — TSH: Thyroid Stimulating Horm: 0.439 u[IU]/mL — ABNORMAL LOW

## 2011-08-27 ENCOUNTER — Ambulatory Visit: Payer: Self-pay | Admitting: General Practice

## 2011-08-29 ENCOUNTER — Encounter: Payer: Self-pay | Admitting: Internal Medicine

## 2011-10-18 ENCOUNTER — Ambulatory Visit: Payer: Self-pay | Admitting: General Practice

## 2011-10-21 ENCOUNTER — Ambulatory Visit (INDEPENDENT_AMBULATORY_CARE_PROVIDER_SITE_OTHER): Payer: PRIVATE HEALTH INSURANCE | Admitting: Internal Medicine

## 2011-10-21 ENCOUNTER — Encounter: Payer: Self-pay | Admitting: Internal Medicine

## 2011-10-21 VITALS — BP 122/68 | HR 71 | Temp 98.4°F | Ht 62.0 in | Wt 113.5 lb

## 2011-10-21 DIAGNOSIS — F411 Generalized anxiety disorder: Secondary | ICD-10-CM

## 2011-10-21 DIAGNOSIS — R4589 Other symptoms and signs involving emotional state: Secondary | ICD-10-CM

## 2011-10-21 MED ORDER — ALPRAZOLAM 0.25 MG PO TABS: 0.2500 mg | ORAL_TABLET | ORAL | Status: DC | PRN

## 2011-10-21 MED ORDER — SERTRALINE HCL 50 MG PO TABS: 50.0000 mg | ORAL_TABLET | Freq: Every day | ORAL | Status: DC

## 2011-10-21 NOTE — Patient Instructions (Addendum)
Start sertraline (zoloft) at 1/2 tablet daily with dinner for the first week, then increase to full tablet.  Se MyChart to e mail me how you are feeling to decide if we ned to change the dose  Continue  to use alprazolam AS NEEDED

## 2011-10-21 NOTE — Assessment & Plan Note (Signed)
Given her concurrent OCD tendencies and  uncontrolled anxiety currently I strongly recommended that she get Zoloft to try. We'll start the sertraline 50 mg every start half a tablet daily for the first week. She will followup with me in one month degenerative disc issue titrated or change.

## 2011-10-21 NOTE — Progress Notes (Signed)
Patient ID: Connie Francis, female   DOB: 1970/01/30, 41 y.o.   MRN: [id number]  Patient Active Problem List  Diagnosis  . Postpartum cardiomyopathy  . Obsessive compulsive disorder  . Breast cyst  . Anxiety in acute stress reaction  . Thyroiditis, acute    Subjective:  CC:   Chief Complaint  Patient presents with  . Hypertension    HPI:   Connie Cainis a 41 y.o. female who presents with elevated blood pressures and several other acute symptoms. . Patient reports a recent episode of headache,  elevated blood pressure and abnormal sensation in her head. She states that the symptoms started after she received the Flumist 2 weeks ago, which she chose as an alternative to getting her flu shot from a nurse trainee.  She went to employee health because of the strange feeling in her head and was treated with Allegra and Z-Pak for signs and symptoms of sinusitis. She developed tachycardia from the Allegra and had a nurse at the cancer Center check her blood pressure and found to be elevated at 140/100. Because she was continued have a funny feeling in her head she was sent to the emergency room first emergency T. scan which was done because she was having concurrent twitching in the right side of her face and numbness in her cheek. CT was of course negative.. She reports increased anxiety which she has not addressed with the citalopram prescription is given to her at last visit. She is seeing a therapist who wants her to stay off of medications. However she is responding to minimal symptoms with concerns for major health problems which is understandable given her history of postpartum cardiomyopathy. She knowledge is that she may be jumping to conclusions due to her untreated anxiety. She has tried using alprazolam but is reluctant to use it regularly for fear of addiction. She has seen psychiatrists in the past who have recommended Zoloft for obsessive-compulsive traits.     Past Medical History    Diagnosis Date  . Postpartum dilated cardiomyopathy   . Obsessive compulsive disorder   . Breast cyst     left breast, follow up with Evette Cristal    Past Surgical History  Procedure Date  . Dilation and curettage of uterus 2012    complicated by heart failure  . Abdominal hysterectomy 2012         The following portions of the patient's history were reviewed and updated as appropriate: Allergies, current medications, and problem list.    Review of Systems:   12 Pt  review of systems was negative except those addressed in the HPI,     History   Social History  . Marital Status: Married    Spouse Name: N/A    Number of Children: N/A  . Years of Education: N/A   Occupational History  . Not on file.   Social History Main Topics  . Smoking status: Never Smoker   . Smokeless tobacco: Never Used  . Alcohol Use: 0.6 oz/week    1 Glasses of wine per week     socially  . Drug Use: Not on file  . Sexually Active: No   Other Topics Concern  . Not on file   Social History Narrative  . No narrative on file    Objective:  BP 122/68  Pulse 71  Temp 98.4 F (36.9 C) (Oral)  Ht 5\' 2"  (1.575 m)  Wt 113 lb 8 oz (51.483 kg)  BMI 20.76 kg/m2  SpO2  97%  General appearance: alert, cooperative and appears stated age Ears: normal TM's and external ear canals both ears Throat: lips, mucosa, and tongue normal; teeth and gums normal Neck: no adenopathy, no carotid bruit, supple, symmetrical, trachea midline and thyroid not enlarged, symmetric, no tenderness/mass/nodules Back: symmetric, no curvature. ROM normal. No CVA tenderness. Lungs: clear to auscultation bilaterally Heart: regular rate and rhythm, S1, S2 normal, no murmur, click, rub or gallop Abdomen: soft, non-tender; bowel sounds normal; no masses,  no organomegaly Pulses: 2+ and symmetric Skin: Skin color, texture, turgor normal. No rashes or lesions Lymph nodes: Cervical, supraclavicular, and axillary nodes  normal.  Assessment and Plan:  Anxiety in acute stress reaction Spent 20 minutes reviewing all of her symptoms her past medical history and her current uncontrolled anxiety.Reassurance given that her elevated blood pressures were due to stress reaction anxiety. She has no history of hypertension. Given her concurrent OCD tendencies and  uncontrolled anxiety currently I strongly recommended that she get Zoloft to try. We'll start the sertraline 50 mg every start half a tablet daily for the first week. She will followup with me in one month degenerative disc issue titrated or change.    Updated Medication List Outpatient Encounter Prescriptions as of 10/21/2011  Medication Sig Dispense Refill  . ALPRAZolam (XANAX) 0.25 MG tablet Take 1 tablet (0.25 mg total) by mouth as needed for sleep or anxiety.  30 tablet  3  . DISCONTD: ALPRAZolam (XANAX) 0.25 MG tablet Take 1 tablet (0.25 mg total) by mouth as needed.  30 tablet  1  . sertraline (ZOLOFT) 50 MG tablet Take 1 tablet (50 mg total) by mouth daily.  30 tablet  3  . DISCONTD: citalopram (CELEXA) 10 MG tablet Take 1 tablet (10 mg total) by mouth daily.  30 tablet  1     No orders of the defined types were placed in this encounter.    No Follow-up on file.

## 2011-10-22 ENCOUNTER — Observation Stay: Payer: Self-pay | Admitting: Internal Medicine

## 2011-10-22 ENCOUNTER — Other Ambulatory Visit: Payer: Self-pay

## 2011-10-22 LAB — URINALYSIS, COMPLETE
Bacteria: NONE SEEN
Bilirubin,UR: NEGATIVE
Glucose,UR: NEGATIVE mg/dL (ref 0–75)
Ketone: NEGATIVE
Nitrite: NEGATIVE
Ph: 7 (ref 4.5–8.0)
Specific Gravity: 1.004 (ref 1.003–1.030)
Squamous Epithelial: 1

## 2011-10-22 LAB — COMPREHENSIVE METABOLIC PANEL
Albumin: 4.4 g/dL (ref 3.4–5.0)
Anion Gap: 8 (ref 7–16)
Calcium, Total: 8.5 mg/dL (ref 8.5–10.1)
Co2: 26 mmol/L (ref 21–32)
Glucose: 100 mg/dL — ABNORMAL HIGH (ref 65–99)
Osmolality: 279 (ref 275–301)
Potassium: 3.7 mmol/L (ref 3.5–5.1)
SGOT(AST): 26 U/L (ref 15–37)
SGPT (ALT): 20 U/L (ref 12–78)
Sodium: 140 mmol/L (ref 136–145)

## 2011-10-22 LAB — CBC
HCT: 38 % (ref 35.0–47.0)
MCH: 30.3 pg (ref 26.0–34.0)
MCHC: 33.6 g/dL (ref 32.0–36.0)
MCV: 90 fL (ref 80–100)
Platelet: 265 10*3/uL (ref 150–440)
RDW: 12 % (ref 11.5–14.5)

## 2011-10-23 ENCOUNTER — Telehealth: Payer: Self-pay | Admitting: Internal Medicine

## 2011-10-23 NOTE — Telephone Encounter (Signed)
Hospital follow up scheduled on 10.17.13 @ 10:15.

## 2011-10-24 ENCOUNTER — Encounter: Payer: Self-pay | Admitting: Internal Medicine

## 2011-10-24 LAB — URINE CULTURE

## 2011-10-31 ENCOUNTER — Ambulatory Visit (INDEPENDENT_AMBULATORY_CARE_PROVIDER_SITE_OTHER): Payer: PRIVATE HEALTH INSURANCE | Admitting: Internal Medicine

## 2011-10-31 ENCOUNTER — Encounter: Payer: Self-pay | Admitting: Internal Medicine

## 2011-10-31 VITALS — BP 118/68 | HR 65 | Temp 99.0°F | Ht 63.0 in | Wt 111.8 lb

## 2011-10-31 DIAGNOSIS — O903 Peripartum cardiomyopathy: Secondary | ICD-10-CM

## 2011-10-31 DIAGNOSIS — I10 Essential (primary) hypertension: Secondary | ICD-10-CM

## 2011-10-31 DIAGNOSIS — F429 Obsessive-compulsive disorder, unspecified: Secondary | ICD-10-CM

## 2011-10-31 DIAGNOSIS — R002 Palpitations: Secondary | ICD-10-CM

## 2011-10-31 NOTE — Progress Notes (Signed)
Patient ID: Connie Francis, female   DOB: 08-Aug-1970, 41 y.o.   MRN: [id number]  Patient Active Problem List  Diagnosis  . Postpartum cardiomyopathy  . Obsessive compulsive disorder  . Breast cyst  . Anxiety in acute stress reaction    Subjective:  CC:   Chief Complaint  Patient presents with  . Follow-up    HPI:   Connie Cainis a 41 y.o. female who presents for hospital follow up.  She is very frustrated due to her continued symptoms.   She was hospitalized for sudden onset of right eye twitching followed by right arm numbness and right facial numbness.  Symptoms occurred the day after she started zoloft, and 10 days after the flu vaccine (took the nasal mist) .  She was admitted for TIA and underwent evaluation with MRI, EKg., Telemetry .  All normal.  TSH was slightly low .    She saw Dr.  Tedd Sias who thinks low TSH was transient reaction to the flumist so she is going to repeat the TSH every 3 months . [place] far she was had 4 ER visits and one hospitalization for episodes of elevated blood pressure, headaches, palpitations and parasthesias. She continues to have vision changes in right eye  feels like floaters, intermittent.  Occasionally feeling off balance, dizzy but not vertigo.  She has stopped the zoloft.  Taking the xanax sparingly despite being very anxious bc she is concerned about addiction.    Elevated blood pressures. Patient reports a recent episode of headache,  elevated blood pressure and abnormal sensation in her head. She states that the symptoms started after she received the After taking the Flumist 2 weeks ago, which she chose as an alternative to getting her flu shot from a nurse trainee, she developed a strange feeling in her head and went to employee health.   She was treated with Allegra and Z-Pak for signs and symptoms of sinusitis. She developed tachycardia from the Allegra and had a nurse at the cancer Center check her blood pressure and found to be elevated at 140/100.  Because she continued have a funny feeling in her head she was sent to the emergency room and a CT was done because she was having concurrent twitching in the right side of her face and numbness in her cheek. CT was of course negative.  Past Medical History  Diagnosis Date  . Postpartum dilated cardiomyopathy   . Obsessive compulsive disorder   . Breast cyst     left breast, follow up with Evette Cristal    Past Surgical History  Procedure Date  . Dilation and curettage of uterus 2012    complicated by heart failure  . Abdominal hysterectomy 2012         The following portions of the patient's history were reviewed and updated as appropriate: Allergies, current medications, and problem list.    Review of Systems:   12 Pt  review of systems was negative except those addressed in the HPI,     History   Social History  . Marital Status: Married    Spouse Name: N/A    Number of Children: N/A  . Years of Education: N/A   Occupational History  . Not on file.   Social History Main Topics  . Smoking status: Never Smoker   . Smokeless tobacco: Never Used  . Alcohol Use: 0.6 oz/week    1 Glasses of wine per week     socially  . Drug Use: Not on file  .  Sexually Active: No   Other Topics Concern  . Not on file   Social History Narrative  . No narrative on file    Objective:  BP 118/68  Pulse 65  Temp 99 F (37.2 C) (Oral)  Ht 5\' 3"  (1.6 m)  Wt 111 lb 12 oz (50.689 kg)  BMI 19.80 kg/m2  SpO2 95%  General appearance: alert, cooperative and appears stated age Ears: normal TM's and external ear canals both ears Throat: lips, mucosa, and tongue normal; teeth and gums normal Neck: no adenopathy, no carotid bruit, supple, symmetrical, trachea midline and thyroid not enlarged, symmetric, no tenderness/mass/nodules Back: symmetric, no curvature. ROM normal. No CVA tenderness. Lungs: clear to auscultation bilaterally Heart: regular rate and rhythm, S1, S2 normal, no  murmur, click, rub or gallop Abdomen: soft, non-tender; bowel sounds normal; no masses,  no organomegaly Pulses: 2+ and symmetric Skin: Skin color, texture, turgor normal. No rashes or lesions Lymph nodes: Cervical, supraclavicular, and axillary nodes normal.  Assessment and Plan:  Postpartum cardiomyopathy Given her history of post partum cardiomyopathy  And recurrent episodes of hypertension, palpitations and parasthesias with a normal MRI and EKG, I am referring to cardiology for evaluation with ambulatory BP monitor, Cardionet and ECHO  Obsessive compulsive disorder She had an adverse reaction after one dose of zoloft and is hesitant to try it again. Continue prn alprazolam pending cardiology evaluation .   Updated Medication List Outpatient Encounter Prescriptions as of 10/31/2011  Medication Sig Dispense Refill  . ALPRAZolam (XANAX) 0.25 MG tablet Take 1 tablet (0.25 mg total) by mouth as needed for sleep or anxiety.  30 tablet  3  . DISCONTD: sertraline (ZOLOFT) 50 MG tablet Take 1 tablet (50 mg total) by mouth daily.  30 tablet  3     Orders Placed This Encounter  Procedures  . Ambulatory referral to Cardiology    No Follow-up on file.

## 2011-11-03 ENCOUNTER — Encounter: Payer: Self-pay | Admitting: Internal Medicine

## 2011-11-03 NOTE — Assessment & Plan Note (Signed)
Given her history of post partum cardiomyopathy  And recurrent episodes of hypertension, palpitations and parasthesias with a normal MRI and EKG, I am referring to cardiology for evaluation with ambulatory BP monitor, Cardionet and ECHO

## 2011-11-03 NOTE — Assessment & Plan Note (Signed)
She had an adverse reaction after one dose of zoloft and is hesitant to try it again. Continue prn alprazolam pending cardiology evaluation .

## 2011-11-05 ENCOUNTER — Ambulatory Visit (INDEPENDENT_AMBULATORY_CARE_PROVIDER_SITE_OTHER): Payer: PRIVATE HEALTH INSURANCE | Admitting: Cardiovascular Disease

## 2011-11-05 ENCOUNTER — Encounter: Payer: Self-pay | Admitting: *Deleted

## 2011-11-05 VITALS — BP 151/88 | HR 80 | Ht 62.0 in | Wt 112.2 lb

## 2011-11-05 DIAGNOSIS — R002 Palpitations: Secondary | ICD-10-CM

## 2011-11-05 DIAGNOSIS — R030 Elevated blood-pressure reading, without diagnosis of hypertension: Secondary | ICD-10-CM

## 2011-11-05 NOTE — Progress Notes (Signed)
HPI  This is a 41 year old pleasant female who was referred by Dr. Darrick Huntsman for evaluation of palpitations and elevated blood pressure. She works at Engineer, structural at [employer]. There is reported history of postpartum cardiomyopathy. However, it appears that her ejection fraction was never low. After delivery in 2006 she reports having an allergic reaction to latex. She was resuscitated with IV fluids but developed fluid overload. She had an echocardiogram done which showed normal LV systolic function and no significant valvular abnormalities. Since then, she had multiple echocardiograms done most recently a few months ago at [facility] which shows was read by me. The study was essentially normal. In July, she was diagnosed with borderline hyperthyroidism that did not require treatment. She was treated with Inderal and Toprol temporarily at that time. Her thyroid function has fluctuated since then. She has been having also elevated blood pressure readings when she is under stress. She has known history of anxiety disorder. She was tried on Zoloft but had a reaction to it.  She complains of palpitations with skipping in her heart. She denies syncope or presyncope. She denies chest pain or shortness of breath.  Allergies  Allergen Reactions  . Latex     Swelling and itching   . Penicillins     swelling  . Promethazine   . Shellfish Allergy     hives  . Sulfa Antibiotics     Rash and itching      Current Outpatient Prescriptions on File Prior to Visit  Medication Sig Dispense Refill  . ALPRAZolam (XANAX) 0.25 MG tablet Take 1 tablet (0.25 mg total) by mouth as needed for sleep or anxiety.  30 tablet  3  . fexofenadine (ALLEGRA) 180 MG tablet Take 180 mg by mouth daily.         Past Medical History  Diagnosis Date  . Postpartum dilated cardiomyopathy   . Obsessive compulsive disorder   . Breast cyst     left breast, follow up with Sankar  . Kidney stones   . Thyroid disease    hyper  . CHF (congestive heart failure)      Past Surgical History  Procedure Date  . Dilation and curettage of uterus 2012    complicated by heart failure  . Abdominal hysterectomy 2012  . Cesarean section   . Upper endoscopy w/ esophageal manometry   . Colonoscopy      Family History  Problem Relation Age of Onset  . Cancer Maternal Aunt     parotid Ca  . Stroke Paternal Aunt   . Cancer Paternal Uncle     lung, nasopharnygeal  . Hearing loss Maternal Grandmother   . Alcohol abuse Maternal Grandmother   . Arthritis Maternal Grandfather      History   Social History  . Marital Status: Married    Spouse Name: N/A    Number of Children: N/A  . Years of Education: N/A   Occupational History  . Not on file.   Social History Main Topics  . Smoking status: Never Smoker   . Smokeless tobacco: Never Used  . Alcohol Use: 0.6 oz/week    1 Glasses of wine per week     socially  . Drug Use: Not on file  . Sexually Active: No   Other Topics Concern  . Not on file   Social History Narrative  . No narrative on file     ROS Constitutional: Negative for fever, chills, diaphoresis, activity change, appetite change  and fatigue.  HENT: Negative for hearing loss, nosebleeds, congestion, sore throat, facial swelling, drooling, trouble swallowing, neck pain, voice change, sinus pressure and tinnitus.  Eyes: Negative for photophobia, pain, discharge and visual disturbance.  Respiratory: Negative for apnea, cough, chest tightness, shortness of breath and wheezing.  Cardiovascular: Negative for chest pain and leg swelling.  Gastrointestinal: Negative for nausea, vomiting, abdominal pain, diarrhea, constipation, blood in stool and abdominal distention.  Genitourinary: Negative for dysuria, urgency, frequency, hematuria and decreased urine volume.  Musculoskeletal: Negative for myalgias, back pain, joint swelling, arthralgias and gait problem.  Skin: Negative for color change,  pallor, rash and wound.  Neurological: Negative for dizziness, tremors, seizures, syncope, speech difficulty, weakness, light-headedness, numbness and headaches.  Psychiatric/Behavioral: Negative for suicidal ideas, hallucinations, behavioral problems and agitation. The patient is  nervous/anxious.     PHYSICAL EXAM   BP 151/88  Pulse 80  Ht 5\' 2"  (1.575 m)  Wt 112 lb 4 oz (50.916 kg)  BMI 20.53 kg/m2 Constitutional: She is oriented to person, place, and time. She appears well-developed and well-nourished. No distress.  HENT: No nasal discharge.  Head: Normocephalic and atraumatic.  Eyes: Pupils are equal and round. Right eye exhibits no discharge. Left eye exhibits no discharge.  Neck: Normal range of motion. Neck supple. No JVD present. No thyromegaly present.  Cardiovascular: Normal rate, regular rhythm, normal heart sounds. Exam reveals no gallop and no friction rub. No murmur heard.  Pulmonary/Chest: Effort normal and breath sounds normal. No stridor. No respiratory distress. She has no wheezes. She has no rales. She exhibits no tenderness.  Abdominal: Soft. Bowel sounds are normal. She exhibits no distension. There is no tenderness. There is no rebound and no guarding.  Musculoskeletal: Normal range of motion. She exhibits no edema and no tenderness.  Neurological: She is alert and oriented to person, place, and time. Coordination normal.  Skin: Skin is warm and dry. No rash noted. She is not diaphoretic. No erythema. No pallor.  Psychiatric: She has a normal mood and affect. Her behavior is normal. Judgment and thought content normal.     ZOX:WRUEA  Rhythm  -RSR(V1) -nondiagnostic.   BORDERLINE RHYTHM   ASSESSMENT AND PLAN

## 2011-11-05 NOTE — Patient Instructions (Addendum)
Your physician has recommended that you wear a holter monitor. Holter monitors are medical devices that record the heart's electrical activity. Doctors most often use these monitors to diagnose arrhythmias. Arrhythmias are problems with the speed or rhythm of the heartbeat. The monitor is a small, portable device. You can wear one while you do your normal daily activities. This is usually used to diagnose what is causing palpitations/syncope (passing out).  24 hour Blood pressure monitor.   Follow up after tests.

## 2011-11-05 NOTE — Assessment & Plan Note (Addendum)
These are likely due to premature beats. It's possible that some of these might be related to borderline hyperthyroidism. I will request a 48-hour Holter monitor and consider treatment with a beta blocker if needed. She tolerated Toprol better than Inderal before.  she had an echocardiogram done a few months ago which was normal.

## 2011-11-05 NOTE — Assessment & Plan Note (Signed)
I suspect that this is reactive due to anxiety. I will obtain a 24 hour ambulatory blood pressure monitor.

## 2011-11-08 ENCOUNTER — Other Ambulatory Visit: Payer: Self-pay

## 2011-11-11 ENCOUNTER — Encounter (INDEPENDENT_AMBULATORY_CARE_PROVIDER_SITE_OTHER): Payer: PRIVATE HEALTH INSURANCE

## 2011-11-11 DIAGNOSIS — R030 Elevated blood-pressure reading, without diagnosis of hypertension: Secondary | ICD-10-CM

## 2011-11-11 NOTE — Addendum Note (Signed)
Addended by: Festus Aloe on: 11/11/2011 07:59 AM   Modules accepted: Orders

## 2011-11-13 ENCOUNTER — Other Ambulatory Visit: Payer: Self-pay | Admitting: Cardiovascular Disease

## 2011-11-15 ENCOUNTER — Encounter: Payer: Self-pay | Admitting: Cardiovascular Disease

## 2011-11-19 ENCOUNTER — Other Ambulatory Visit: Payer: Self-pay

## 2011-11-19 ENCOUNTER — Telehealth: Payer: Self-pay

## 2011-11-19 MED ORDER — METOPROLOL SUCCINATE ER 25 MG PO TB24: 25.0000 mg | ORAL_TABLET | Freq: Every day | ORAL | Status: DC

## 2011-11-19 NOTE — Telephone Encounter (Signed)
Message copied by Marcelle Overlie on Tue Nov 19, 2011  7:58 AM ------      Message from: Lorine Bears A      Created: Mon Nov 18, 2011  4:23 PM      Regarding: Results       Holter monitor showed occasional PVCs. BP monitor showed frequent elevation in BP. I recommend starting Toprol XL 25 mg once daily.

## 2011-11-19 NOTE — Telephone Encounter (Signed)
Pt informed. Has appt with Dr. Kirke Corin 11/22/11. Will discuss further at that time. RX sent to pharm.

## 2011-11-22 ENCOUNTER — Ambulatory Visit (INDEPENDENT_AMBULATORY_CARE_PROVIDER_SITE_OTHER): Payer: PRIVATE HEALTH INSURANCE | Admitting: Cardiovascular Disease

## 2011-11-22 ENCOUNTER — Encounter: Payer: Self-pay | Admitting: Cardiovascular Disease

## 2011-11-22 VITALS — BP 120/74 | HR 70 | Ht 62.0 in | Wt 117.0 lb

## 2011-11-22 DIAGNOSIS — R030 Elevated blood-pressure reading, without diagnosis of hypertension: Secondary | ICD-10-CM

## 2011-11-22 DIAGNOSIS — IMO0001 Reserved for inherently not codable concepts without codable children: Secondary | ICD-10-CM

## 2011-11-22 DIAGNOSIS — R002 Palpitations: Secondary | ICD-10-CM

## 2011-11-22 NOTE — Patient Instructions (Addendum)
Continue Toprol.  Follow up in 6 months.

## 2011-11-22 NOTE — Progress Notes (Signed)
HPI  This is a 41 year old pleasant female who is here today for a followup visit regarding palpitations and elevated blood pressure. The details of her presentation were listed in my previous note. She underwent cardiac evaluation which included a Holter monitor which showed sinus rhythm with occasional PVCs and some PACs. No other significant arrhythmias were noted. Ambulatory blood pressure monitor showed frequent elevation of blood pressure above 140/90 mostly during the day. The blood pressure went back quickly to normal. I decided to start her on Toprol 25 mg once daily to treat both conditions. She is actually feeling better.  Allergies  Allergen Reactions  . Latex     Swelling and itching   . Penicillins     swelling  . Promethazine   . Shellfish Allergy     hives  . Sulfa Antibiotics     Rash and itching      Current Outpatient Prescriptions on File Prior to Visit  Medication Sig Dispense Refill  . ALPRAZolam (XANAX) 0.25 MG tablet Take 1 tablet (0.25 mg total) by mouth as needed for sleep or anxiety.  30 tablet  3  . fexofenadine (ALLEGRA) 180 MG tablet Take 180 mg by mouth as needed.       . metoprolol succinate (TOPROL XL) 25 MG 24 hr tablet Take 1 tablet (25 mg total) by mouth daily.  90 tablet  3     Past Medical History  Diagnosis Date  . Postpartum dilated cardiomyopathy   . Obsessive compulsive disorder   . Breast cyst     left breast, follow up with Sankar  . Kidney stones   . Thyroid disease     hyper  . CHF (congestive heart failure)      Past Surgical History  Procedure Date  . Dilation and curettage of uterus 2012    complicated by heart failure  . Abdominal hysterectomy 2012  . Cesarean section   . Upper endoscopy w/ esophageal manometry   . Colonoscopy      Family History  Problem Relation Age of Onset  . Cancer Maternal Aunt     parotid Ca  . Stroke Paternal Aunt   . Cancer Paternal Uncle     lung, nasopharnygeal  . Hearing loss  Maternal Grandmother   . Alcohol abuse Maternal Grandmother   . Arthritis Maternal Grandfather      History   Social History  . Marital Status: Married    Spouse Name: N/A    Number of Children: N/A  . Years of Education: N/A   Occupational History  . Not on file.   Social History Main Topics  . Smoking status: Never Smoker   . Smokeless tobacco: Never Used  . Alcohol Use: 0.6 oz/week    1 Glasses of wine per week     Comment: socially  . Drug Use: Not on file  . Sexually Active: No   Other Topics Concern  . Not on file   Social History Narrative  . No narrative on file      PHYSICAL EXAM   BP 120/74  Pulse 70  Ht 5\' 2"  (1.575 m)  Wt 117 lb (53.071 kg)  BMI 21.40 kg/m2 Constitutional: She is oriented to person, place, and time. She appears well-developed and well-nourished. No distress.  HENT: No nasal discharge.  Head: Normocephalic and atraumatic.  Eyes: Pupils are equal and round. Right eye exhibits no discharge. Left eye exhibits no discharge.  Neck: Normal range of motion. Neck  supple. No JVD present. No thyromegaly present.  Cardiovascular: Normal rate, regular rhythm, normal heart sounds. Exam reveals no gallop and no friction rub. No murmur heard.  Pulmonary/Chest: Effort normal and breath sounds normal. No stridor. No respiratory distress. She has no wheezes. She has no rales. She exhibits no tenderness.  Abdominal: Soft. Bowel sounds are normal. She exhibits no distension. There is no tenderness. There is no rebound and no guarding.  Musculoskeletal: Normal range of motion. She exhibits no edema and no tenderness.  Neurological: She is alert and oriented to person, place, and time. Coordination normal.  Skin: Skin is warm and dry. No rash noted. She is not diaphoretic. No erythema. No pallor.  Psychiatric: She has a normal mood and affect. Her behavior is normal. Judgment and thought content normal.      ASSESSMENT AND PLAN

## 2011-11-22 NOTE — Assessment & Plan Note (Signed)
This is likely a reactive. It seems that Toprol is helping regulate her blood pressure.

## 2011-11-22 NOTE — Assessment & Plan Note (Signed)
There seem to be due to PVCs. I suspect that a lot of her symptoms are related to stress and anxiety mostly work-related. I recommend continuing Toprol at the current dose 25 mg once daily.

## 2011-11-27 ENCOUNTER — Ambulatory Visit (INDEPENDENT_AMBULATORY_CARE_PROVIDER_SITE_OTHER): Payer: PRIVATE HEALTH INSURANCE | Admitting: Internal Medicine

## 2011-11-27 ENCOUNTER — Encounter: Payer: Self-pay | Admitting: Internal Medicine

## 2011-11-27 VITALS — BP 120/70 | HR 59 | Temp 98.3°F | Resp 12 | Ht 62.0 in | Wt 114.5 lb

## 2011-11-27 DIAGNOSIS — R1012 Left upper quadrant pain: Secondary | ICD-10-CM

## 2011-11-27 NOTE — Patient Instructions (Addendum)
Try 8 mcg amitiza once daily for your bowels and LUQ fullness .

## 2011-11-30 DIAGNOSIS — R1012 Left upper quadrant pain: Secondary | ICD-10-CM | POA: Insufficient documentation

## 2011-11-30 NOTE — Progress Notes (Signed)
Patient ID: Connie Francis, female   DOB: 11/05/1970, 41 y.o.   MRN: [id number]  Patient Active Problem List  Diagnosis  . Postpartum cardiomyopathy  . Obsessive compulsive disorder  . Breast cyst  . Anxiety in acute stress reaction  . Heart palpitations  . Elevated BP  . LUQ pain    Subjective:  CC:   Chief Complaint  Patient presents with  . Follow-up    HPI:   Connie Cainis a 41 y.o. female who presents for follow up on recurrent episodes of elevated blood pressures accompanied by facial numbness. She was evaluated by cardiology recently. A 48 Hr Holter monitor showed only pvcs and pacs.  Ambulatory bp monitor showed elevated readings during the day only.   She was started on Toprol with good tolerance and decreased symptomatology.  She attributes her reactions to what are considered minor arrhythmias to the stress of her recent job change and her relative unhappiness in her current position.  She has a new symptom that she has been worrying about .  She has recurrent feelings of pressure and pain under her right ribcage, not associated with eating or belching.  She has a history of constipation and notes that the pain occurs when she is constipated.      Past Medical History  Diagnosis Date  . Postpartum dilated cardiomyopathy   . Obsessive compulsive disorder   . Breast cyst     left breast, follow up with Sankar  . Kidney stones   . Thyroid disease     hyper  . CHF (congestive heart failure)     Past Surgical History  Procedure Date  . Dilation and curettage of uterus 2012    complicated by heart failure  . Abdominal hysterectomy 2012  . Cesarean section   . Upper endoscopy w/ esophageal manometry   . Colonoscopy          The following portions of the patient's history were reviewed and updated as appropriate: Allergies, current medications, and problem list.    Review of Systems:   12 Pt  review of systems was negative except those addressed in the  HPI,     History   Social History  . Marital Status: Married    Spouse Name: N/A    Number of Children: N/A  . Years of Education: N/A   Occupational History  . Not on file.   Social History Main Topics  . Smoking status: Never Smoker   . Smokeless tobacco: Never Used  . Alcohol Use: 0.6 oz/week    1 Glasses of wine per week     Comment: socially  . Drug Use: Not on file  . Sexually Active: No   Other Topics Concern  . Not on file   Social History Narrative  . No narrative on file    Objective:  BP 120/70  Pulse 59  Temp 98.3 F (36.8 C) (Oral)  Resp 12  Ht 5\' 2"  (1.575 m)  Wt 114 lb 8 oz (51.937 kg)  BMI 20.94 kg/m2  SpO2 97%  General appearance: alert, cooperative and appears stated age Ears: normal TM's and external ear canals both ears Throat: lips, mucosa, and tongue normal; teeth and gums normal Neck: no adenopathy, no carotid bruit, supple, symmetrical, trachea midline and thyroid not enlarged, symmetric, no tenderness/mass/nodules Back: symmetric, no curvature. ROM normal. No CVA tenderness. Lungs: clear to auscultation bilaterally Heart: regular rate and rhythm, S1, S2 normal, no murmur, click, rub or gallop Abdomen: soft,  non-tender; bowel sounds normal; no masses,  no organomegaly Pulses: 2+ and symmetric Skin: Skin color, texture, turgor normal. No rashes or lesions Lymph nodes: Cervical, supraclavicular, and axillary nodes normal.  Assessment and Plan:  LUQ pain Her exam is normal.  I suggested treating her constipation with amitiza empirically to see if pain resolves with regular bowel movements.    Updated Medication List Outpatient Encounter Prescriptions as of 11/27/2011  Medication Sig Dispense Refill  . ALPRAZolam (XANAX) 0.25 MG tablet Take 1 tablet (0.25 mg total) by mouth as needed for sleep or anxiety.  30 tablet  3  . fexofenadine (ALLEGRA) 180 MG tablet Take 180 mg by mouth as needed.       . metoprolol succinate (TOPROL-XL)  25 MG 24 hr tablet Take 25 mg by mouth daily. Take 1/2 tablet daily.      . [DISCONTINUED] metoprolol succinate (TOPROL XL) 25 MG 24 hr tablet Take 1 tablet (25 mg total) by mouth daily.  90 tablet  3     No orders of the defined types were placed in this encounter.    No Follow-up on file.

## 2011-11-30 NOTE — Assessment & Plan Note (Signed)
Her exam is normal.  I suggested treating her constipation with amitiza empirically to see if pain resolves with regular bowel movements.

## 2011-12-05 ENCOUNTER — Encounter: Payer: Self-pay | Admitting: Internal Medicine

## 2011-12-05 DIAGNOSIS — K581 Irritable bowel syndrome with constipation: Secondary | ICD-10-CM

## 2011-12-06 ENCOUNTER — Emergency Department: Payer: Self-pay | Admitting: Internal Medicine

## 2011-12-06 ENCOUNTER — Encounter: Payer: Self-pay | Admitting: Internal Medicine

## 2011-12-06 DIAGNOSIS — K581 Irritable bowel syndrome with constipation: Secondary | ICD-10-CM | POA: Insufficient documentation

## 2011-12-06 DIAGNOSIS — K589 Irritable bowel syndrome without diarrhea: Secondary | ICD-10-CM | POA: Insufficient documentation

## 2011-12-06 LAB — CBC
HGB: 13.6 g/dL (ref 12.0–16.0)
MCH: 30.9 pg (ref 26.0–34.0)
MCV: 89 fL (ref 80–100)
RBC: 4.41 10*6/uL (ref 3.80–5.20)
RDW: 12.2 % (ref 11.5–14.5)

## 2011-12-06 LAB — COMPREHENSIVE METABOLIC PANEL
Alkaline Phosphatase: 79 U/L (ref 50–136)
Calcium, Total: 9 mg/dL (ref 8.5–10.1)
Chloride: 105 mmol/L (ref 98–107)
Co2: 30 mmol/L (ref 21–32)
Creatinine: 0.68 mg/dL (ref 0.60–1.30)
EGFR (Non-African Amer.): >60
Potassium: 3.3 mmol/L — ABNORMAL LOW (ref 3.5–5.1)
SGOT(AST): 22 U/L (ref 15–37)
SGPT (ALT): 18 U/L (ref 12–78)

## 2011-12-06 LAB — URINALYSIS, COMPLETE
Ketone: NEGATIVE
Ph: 6 (ref 4.5–8.0)
Protein: NEGATIVE
RBC,UR: 1 /HPF (ref 0–5)
Specific Gravity: 1.008 (ref 1.003–1.030)

## 2011-12-06 LAB — LIPASE, BLOOD: Lipase: 105 U/L (ref 73–393)

## 2011-12-06 MED ORDER — LUBIPROSTONE 8 MCG PO CAPS: 8.0000 ug | ORAL_CAPSULE | Freq: Two times a day (BID) | ORAL | Status: DC

## 2011-12-06 NOTE — Assessment & Plan Note (Signed)
Managed with amitiza but dose reduced to once daily

## 2011-12-19 ENCOUNTER — Encounter: Payer: Self-pay | Admitting: Internal Medicine

## 2011-12-19 ENCOUNTER — Other Ambulatory Visit: Payer: Self-pay | Admitting: Internal Medicine

## 2011-12-19 ENCOUNTER — Ambulatory Visit (INDEPENDENT_AMBULATORY_CARE_PROVIDER_SITE_OTHER): Payer: PRIVATE HEALTH INSURANCE | Admitting: Internal Medicine

## 2011-12-19 VITALS — BP 118/62 | HR 89 | Temp 98.4°F | Resp 12 | Ht 62.0 in | Wt 111.5 lb

## 2011-12-19 DIAGNOSIS — K581 Irritable bowel syndrome with constipation: Secondary | ICD-10-CM

## 2011-12-19 DIAGNOSIS — R1012 Left upper quadrant pain: Secondary | ICD-10-CM

## 2011-12-19 DIAGNOSIS — K589 Irritable bowel syndrome without diarrhea: Secondary | ICD-10-CM

## 2011-12-19 DIAGNOSIS — E876 Hypokalemia: Secondary | ICD-10-CM

## 2011-12-19 DIAGNOSIS — R05 Cough: Secondary | ICD-10-CM

## 2011-12-19 DIAGNOSIS — Z1331 Encounter for screening for depression: Secondary | ICD-10-CM

## 2011-12-19 DIAGNOSIS — R002 Palpitations: Secondary | ICD-10-CM

## 2011-12-19 LAB — MAGNESIUM: Magnesium: 2 mg/dL

## 2011-12-19 NOTE — Progress Notes (Signed)
Patient ID: Connie Francis, female   DOB: 03-01-1970, 41 y.o.   MRN: [id number]  Patient Active Problem List  Diagnosis  . Postpartum cardiomyopathy  . Obsessive compulsive disorder  . Breast cyst  . Anxiety in acute stress reaction  . Heart palpitations  . LUQ pain  . Irritable bowel syndrome with constipation  . Hypokalemia    Subjective:  CC:   Chief Complaint  Patient presents with  . Follow-up    HPI:   Connie Cainis a 41 y.o. female who presents for follow up on another ER visit for palpitations on Nov 22.  She was found to be mildly hypokalemic at  3.3.  She had been having diarrhea from the amitiza 8 mcg once daily dose that was initiated after last visit when she complained of chronic constipation and LUQ pain.  Her Mg was not checked.  She has been taking 40 MeQ  potassium daily since Nov 22. 2) Fatigue.  She stopped the metoprolol over thanksgiving and her fatigue has improved.  She has not used her prn propanalol but has it if she needs it. Wthout the metoprolol her blood pressure has been normal.   She has started to exercise regularly again but has limited timed because she is working overtime for the Cancer center.   3)blood in urine.  She was told she had blood in her urine during her ER visit ,  From a dipstick UA that was done, She has a history of nephrolithiasis requiring ureteroscopy and stone removal by [facility]  Review of the UA done in ED showed 2+ blood but the micro was normal.    Past Medical History  Diagnosis Date  . Postpartum dilated cardiomyopathy   . Obsessive compulsive disorder   . Breast cyst     left breast, follow up with Sankar  . Kidney stones   . Thyroid disease     hyper  . CHF (congestive heart failure)     Past Surgical History  Procedure Date  . Dilation and curettage of uterus 2012    complicated by heart failure  . Abdominal hysterectomy 2012  . Cesarean section   . Upper endoscopy w/ esophageal manometry   . Colonoscopy           The following portions of the patient's history were reviewed and updated as appropriate: Allergies, current medications, and problem list.    Review of Systems:   12 Pt  review of systems was negative except those addressed in the HPI,     History   Social History  . Marital Status: Married    Spouse Name: N/A    Number of Children: N/A  . Years of Education: N/A   Occupational History  . Not on file.   Social History Main Topics  . Smoking status: Never Smoker   . Smokeless tobacco: Never Used  . Alcohol Use: 0.6 oz/week    1 Glasses of wine per week     Comment: socially  . Drug Use: Not on file  . Sexually Active: No   Other Topics Concern  . Not on file   Social History Narrative  . No narrative on file    Objective:  BP 118/62  Pulse 89  Temp 98.4 F (36.9 C) (Oral)  Resp 12  Ht 5\' 2"  (1.575 m)  Wt 111 lb 8 oz (50.576 kg)  BMI 20.39 kg/m2  SpO2 97%  General appearance: alert, cooperative and appears stated age Ears: normal TM's and  external ear canals both ears Throat: lips, mucosa, and tongue normal; teeth and gums normal Neck: no adenopathy, no carotid bruit, supple, symmetrical, trachea midline and thyroid not enlarged, symmetric, no tenderness/mass/nodules Back: symmetric, no curvature. ROM normal. No CVA tenderness. Lungs: clear to auscultation bilaterally Heart: regular rate and rhythm, S1, S2 normal, no murmur, click, rub or gallop Abdomen: soft, non-tender; bowel sounds normal; no masses,  no organomegaly Pulses: 2+ and symmetric Skin: Skin color, texture, turgor normal. No rashes or lesions Lymph nodes: Cervical, supraclavicular, and axillary nodes normal.  Assessment and Plan:  LUQ pain attributed to constipation and IBS on prior visit.  No evidence of recurrent nephrolithiasis since urine micro was negative for blood. Currently resolved.    Hypokalemia Secondary to diarrhea. Has taken supplements 20 meQ daily since nov 22.   Repeat K and Mg both normal.  Stop the supplement.   Irritable bowel syndrome with constipation Trial on once daily low dose amitiza caused diarrhea.  Medication stopped   Heart palpitations Multifactorial, very distressing for patient, with frequent ER visits.  reassuranceprovided,  continue prn propranolol since even low dose Toprl caused fatigue.    Updated Medication List Outpatient Encounter Prescriptions as of 12/19/2011  Medication Sig Dispense Refill  . ALPRAZolam (XANAX) 0.25 MG tablet Take 1 tablet (0.25 mg total) by mouth as needed for sleep or anxiety.  30 tablet  3  . fexofenadine (ALLEGRA) 180 MG tablet Take 180 mg by mouth as needed.       . [DISCONTINUED] lubiprostone (AMITIZA) 8 MCG capsule Take 1 capsule (8 mcg total) by mouth 2 (two) times daily with a meal.  60 capsule  2  . [DISCONTINUED] metoprolol succinate (TOPROL-XL) 25 MG 24 hr tablet Take 25 mg by mouth daily. Take 1/2 tablet daily.      . [DISCONTINUED] potassium chloride SA (K-DUR,KLOR-CON) 20 MEQ tablet Take 20 mEq by mouth 2 (two) times daily.         No orders of the defined types were placed in this encounter.    No Follow-up on file.

## 2011-12-19 NOTE — Patient Instructions (Addendum)
I recommend resuming allegra at night to see if the cough resolves.   Check your potassium and magnesium today  Your urinalysis done in the ED did NOT have any red blood cells in it.

## 2011-12-20 ENCOUNTER — Telehealth: Payer: Self-pay | Admitting: Internal Medicine

## 2011-12-20 NOTE — Telephone Encounter (Signed)
Patient notified via phone of normal labs.

## 2011-12-20 NOTE — Telephone Encounter (Signed)
Electrolytes are normal

## 2011-12-21 ENCOUNTER — Encounter: Payer: Self-pay | Admitting: Internal Medicine

## 2011-12-21 NOTE — Assessment & Plan Note (Signed)
Trial on once daily low dose amitiza caused diarrhea.  Medication stopped

## 2011-12-21 NOTE — Assessment & Plan Note (Signed)
attributed to constipation and IBS on prior visit.  No evidence of recurrent nephrolithiasis since urine micro was negative for blood. Currently resolved.

## 2011-12-21 NOTE — Assessment & Plan Note (Signed)
Secondary to diarrhea. Has taken supplements 20 meQ daily since nov 22.  Repeat K and Mg both normal.  Stop the supplement.

## 2011-12-21 NOTE — Assessment & Plan Note (Signed)
Multifactorial, very distressing for patient, with frequent ER visits.  reassuranceprovided,  continue prn propranolol since even low dose Toprl caused fatigue.

## 2011-12-23 ENCOUNTER — Ambulatory Visit: Payer: Self-pay | Admitting: General Practice

## 2011-12-27 ENCOUNTER — Ambulatory Visit: Payer: Self-pay | Admitting: Gastroenterology

## 2011-12-29 ENCOUNTER — Ambulatory Visit: Payer: Self-pay | Admitting: Emergency Medicine

## 2011-12-30 ENCOUNTER — Ambulatory Visit: Payer: Self-pay | Admitting: Gastroenterology

## 2011-12-30 ENCOUNTER — Encounter: Payer: Self-pay | Admitting: Internal Medicine

## 2012-01-01 LAB — PULMONARY FUNCTION TEST

## 2012-01-01 MED ORDER — AMITRIPTYLINE HCL 25 MG PO TABS: 25.0000 mg | ORAL_TABLET | Freq: Every day | ORAL | Status: DC

## 2012-01-07 ENCOUNTER — Encounter: Payer: Self-pay | Admitting: Adult Health

## 2012-01-07 ENCOUNTER — Ambulatory Visit (INDEPENDENT_AMBULATORY_CARE_PROVIDER_SITE_OTHER): Payer: PRIVATE HEALTH INSURANCE | Admitting: Adult Health

## 2012-01-07 ENCOUNTER — Encounter: Payer: Self-pay | Admitting: Internal Medicine

## 2012-01-07 VITALS — BP 130/70 | HR 78 | Temp 98.8°F | Resp 14 | Ht 62.0 in

## 2012-01-07 DIAGNOSIS — F341 Dysthymic disorder: Secondary | ICD-10-CM

## 2012-01-07 DIAGNOSIS — R05 Cough: Secondary | ICD-10-CM

## 2012-01-07 DIAGNOSIS — F418 Other specified anxiety disorders: Secondary | ICD-10-CM

## 2012-01-07 MED ORDER — PAROXETINE HCL 10 MG PO TABS: 10.0000 mg | ORAL_TABLET | ORAL | Status: DC

## 2012-01-07 NOTE — Assessment & Plan Note (Signed)
Cough mainly occuring while at work. Scheduled for allergy testing. Followed by Dr. Meredeth Ide at [facility]. She recently had PFTs done at his office. Will request records. Continue Allegra.

## 2012-01-07 NOTE — Addendum Note (Signed)
Addended by: Novella Olive on: 01/07/2012 01:12 PM   Modules accepted: Level of Service

## 2012-01-07 NOTE — Assessment & Plan Note (Addendum)
Ongoing problems which began in her early 06/20/2022 when her father died tragically in an automobile accident. Most recent episode began in July. Connie Francis has not been consistent with her medications (SSRI). Discussed with patient that these medications do not work overnight. She will need to be on these meds for approx. 2-3 weeks for optimum effect/benefit and then we may need to further adjust dose. Stop amitriptyline and buspar. Start Paxil for depression with anxiety/panic attacks. Encouraged to begin exercising again. Also discussed exploring a hobby/interest that may further help. Will fill out FMLA paperwork. Will also refer to Psychiatry for further evaluation/management of her depression and increased panic episodes. Note that more than 40 minutes was spent face to face with patient discussing this problem and developing a plan.

## 2012-01-07 NOTE — Patient Instructions (Addendum)
  Start your medication today. Remember that this medication needs approximately 3 weeks for optimum effect.   We will call you with the referral to Psychiatry.  We will call you when the FMLA paperwork is completed.  Try to return to exercising and find something fun you can do.   Return to clinic in approximately 3-4 weeks or sooner if needed.

## 2012-01-07 NOTE — Progress Notes (Signed)
  Subjective:    Patient ID: Connie Francis, female    DOB: 10-29-1970, 41 y.o.   MRN: [id number]  HPI  Connie Francis is a very pleasant 41 y/o patient with hx of depression with anxiety. She reports having increased episodes of anxiety which turn into "full blown panic attacks". Connie Francis has not been taking her amitriplyline 2/2 she was started on a prednisone taper and was fearful of the interaction between the two. Connie Francis is still not feeling well. She has a diminished appetite which is also causing her to feel like she is going to "pass out". She also reports that her coughing is ongoing mainly when she is a work. She has an appointment for allergy testing and has also seen Dr. Meredeth Ide for PFTs. She reports that Dr. Meredeth Ide is waiting for results of her allergy testing for further recommendations.  Connie Francis is here with her husband, Connie Francis. He confirms that her anxiety symptoms are worse and that she has been dealing with this since July without relief. Connie Francis has been very supportive. Her work situation seems to also be a contributing factor to her anxiety. She feels she is "underutilized and micro-managed". In addition, her coughing spells seem to be aggravated in the office. Connie Francis reports that multiple staff are having symptoms at work and "getting sick". She regrets leaving her previous job at the [employer].   Current Outpatient Prescriptions on File Prior to Visit  Medication Sig Dispense Refill  . ALPRAZolam (XANAX) 0.25 MG tablet Take 1 tablet (0.25 mg total) by mouth as needed for sleep or anxiety.  30 tablet  3  . fexofenadine (ALLEGRA) 180 MG tablet Take 180 mg by mouth as needed.       Marland Kitchen PARoxetine (PAXIL) 10 MG tablet Take 1 tablet (10 mg total) by mouth every morning.  30 tablet  5     Review of Systems  Constitutional: Positive for appetite change and fatigue. Negative for chills.  HENT: Negative for postnasal drip and sinus pressure.   Eyes: Negative.   Respiratory: Positive for cough and  chest tightness.   Cardiovascular: Negative for chest pain.  Gastrointestinal: Negative.   Genitourinary: Negative.   Musculoskeletal: Negative.   Skin: Negative for rash.  Neurological: Positive for light-headedness.  Psychiatric/Behavioral: Positive for sleep disturbance. Negative for suicidal ideas, confusion and self-injury. The patient is nervous/anxious.      BP 130/70  Pulse 78  Temp 98.8 F (37.1 C) (Oral)  Resp 14  Ht 5\' 2"  (1.575 m)  SpO2 96%     Objective:   Physical Exam  Constitutional: She is oriented to person, place, and time. She appears well-developed and well-nourished.  HENT:  Head: Normocephalic.  Mouth/Throat: Oropharynx is clear and moist.  Eyes: Pupils are equal, round, and reactive to light.  Neck: No tracheal deviation present.  Cardiovascular: Normal rate, regular rhythm and normal heart sounds.  Exam reveals no gallop.   No murmur heard. Pulmonary/Chest: Effort normal and breath sounds normal. No respiratory distress. She has no wheezes. She has no rales.  Abdominal: Soft. Bowel sounds are normal.  Musculoskeletal: She exhibits no edema.  Lymphadenopathy:    She has no cervical adenopathy.  Neurological: She is alert and oriented to person, place, and time.  Skin: Skin is warm and dry.  Psychiatric: Her behavior is normal. Thought content normal.       Tearful throughout visit.        Assessment & Plan:

## 2012-01-14 ENCOUNTER — Emergency Department: Payer: Self-pay | Admitting: Emergency Medicine

## 2012-01-14 LAB — CBC
HCT: 40.4 % (ref 35.0–47.0)
HGB: 13.8 g/dL (ref 12.0–16.0)
MCH: 30.8 pg (ref 26.0–34.0)
Platelet: 268 10*3/uL (ref 150–440)
RBC: 4.49 10*6/uL (ref 3.80–5.20)
WBC: 5.9 10*3/uL (ref 3.6–11.0)

## 2012-01-14 LAB — CK TOTAL AND CKMB (NOT AT ARMC)
CK, Total: 51 U/L (ref 21–215)
CK-MB: <0.5 ng/mL — ABNORMAL LOW (ref 0.5–3.6)

## 2012-01-14 LAB — BASIC METABOLIC PANEL
Anion Gap: 7 (ref 7–16)
Calcium, Total: 9.3 mg/dL (ref 8.5–10.1)
Chloride: 106 mmol/L (ref 98–107)
Co2: 25 mmol/L (ref 21–32)
Glucose: 110 mg/dL — ABNORMAL HIGH (ref 65–99)
Osmolality: 275 (ref 275–301)
Potassium: 3.8 mmol/L (ref 3.5–5.1)

## 2012-01-14 LAB — TROPONIN I: Troponin-I: <0.02 ng/mL

## 2012-01-22 ENCOUNTER — Ambulatory Visit: Payer: Self-pay | Admitting: General Practice

## 2012-01-23 ENCOUNTER — Encounter: Payer: Self-pay | Admitting: Internal Medicine

## 2012-01-23 ENCOUNTER — Telehealth: Payer: Self-pay | Admitting: General Practice

## 2012-01-23 NOTE — Telephone Encounter (Signed)
The FMLA form does not need revision. IThe FMLA form that I filled out is to authorize treatment times silhouette she can see a psychiatrist and have medication management. It is not for not being out of work completely. thr  1.5 hours is the time needed to go to appointments. I cannot advocate that she stay at work completely only that she state that she get psychiatric help.

## 2012-01-23 NOTE — Telephone Encounter (Signed)
FMLA needs to  reflect 8 hours off instead of 1.5 due to pt working 8 hour days. Pt husband will drop off paperwork today. Pt seen Raquel on Christmas Eve but Dr. Darrick Huntsman filled out paperwork.

## 2012-01-24 NOTE — Telephone Encounter (Signed)
Pt notified and paperwork placed up front for pick up.

## 2012-02-04 ENCOUNTER — Emergency Department (HOSPITAL_COMMUNITY)
Admission: EM | Admit: 2012-02-04 | Discharge: 2012-02-04 | Disposition: A | Discharge status: Home / Self Care | Payer: 59 | Source: Home / Self Care | Attending: Family Medicine | Admitting: Family Medicine

## 2012-02-04 ENCOUNTER — Encounter (HOSPITAL_COMMUNITY): Payer: Self-pay | Admitting: *Deleted

## 2012-02-04 DIAGNOSIS — N201 Calculus of ureter: Secondary | ICD-10-CM

## 2012-02-04 LAB — POCT URINALYSIS DIP (DEVICE)
Leukocytes, UA: NEGATIVE
Nitrite: NEGATIVE
Protein, ur: NEGATIVE mg/dL
Urobilinogen, UA: 0.2 mg/dL (ref 0.0–1.0)
pH: 6.5 (ref 5.0–8.0)

## 2012-02-04 NOTE — ED Provider Notes (Signed)
History     CSN: [id number]  Arrival date & time 02/04/12  1126   First MD Initiated Contact with Patient 02/04/12 1200      Chief Complaint  Patient presents with  . Abdominal Pain    (Consider location/radiation/quality/duration/timing/severity/associated sxs/prior treatment) Patient is a 42 y.o. female presenting with flank pain. The history is provided by the patient.  Flank Pain This is a new problem. The current episode started more than 1 week ago (2 weeks ago treated for uti, sx continue). The problem occurs constantly. The problem has been gradually worsening (has h/o right kidney stone. has urologist in [place].Marland Kitchen). Associated symptoms include abdominal pain. Nothing aggravates the symptoms. Nothing relieves the symptoms.    Past Medical History  Diagnosis Date  . Postpartum dilated cardiomyopathy   . Obsessive compulsive disorder   . Breast cyst     left breast, follow up with Sankar  . Kidney stones   . Thyroid disease     hyper  . CHF (congestive heart failure)     Past Surgical History  Procedure Date  . Dilation and curettage of uterus 2012    complicated by heart failure  . Abdominal hysterectomy 2012  . Cesarean section   . Upper endoscopy w/ esophageal manometry   . Colonoscopy     Family History  Problem Relation Age of Onset  . Cancer Maternal Aunt     parotid Ca  . Stroke Paternal Aunt   . Cancer Paternal Uncle     lung, nasopharnygeal  . Hearing loss Maternal Grandmother   . Alcohol abuse Maternal Grandmother   . Arthritis Maternal Grandfather     History  Substance Use Topics  . Smoking status: Never Smoker   . Smokeless tobacco: Never Used  . Alcohol Use: 0.6 oz/week    1 Glasses of wine per week     Comment: socially    OB History    Grav Para Term Preterm Abortions TAB SAB Ect Mult Living                  Review of Systems  Constitutional: Negative.  Negative for fever and chills.  Gastrointestinal: Positive for nausea  and abdominal pain.  Genitourinary: Positive for flank pain.    Allergies  Latex; Penicillins; Promethazine; Shellfish allergy; and Sulfa antibiotics  Home Medications   Current Outpatient Rx  Name  Route  Sig  Dispense  Refill  . ALPRAZOLAM 0.25 MG PO TABS   Oral   Take 1 tablet (0.25 mg total) by mouth as needed for sleep or anxiety.   30 tablet   3   . CITALOPRAM HYDROBROMIDE 10 MG PO TABS   Oral   Take 10 mg by mouth daily.         Marland Kitchen FEXOFENADINE HCL 180 MG PO TABS   Oral   Take 180 mg by mouth as needed.          Marland Kitchen PAROXETINE HCL 10 MG PO TABS   Oral   Take 1 tablet (10 mg total) by mouth every morning.   30 tablet   5     BP 137/79  Pulse 71  Temp 98.8 F (37.1 C) (Oral)  Resp 18  Ht 5\' 2"  (1.575 m)  Wt 107 lb (48.535 kg)  BMI 19.57 kg/m2  SpO2 96%  Physical Exam  Nursing note and vitals reviewed. Constitutional: She is oriented to person, place, and time. She appears well-developed and well-nourished. No distress.  Abdominal:  Soft. Normal appearance, normal aorta and bowel sounds are normal. She exhibits no distension and no mass. There is tenderness. There is CVA tenderness. There is no rebound and no guarding.    Neurological: She is alert and oriented to person, place, and time.  Skin: Skin is warm and dry.    ED Course  Procedures (including critical care time)  Labs Reviewed  POCT URINALYSIS DIP (DEVICE) - Abnormal; Notable for the following:    Hgb urine dipstick MODERATE (*)     All other components within normal limits   No results found.   1. Right ureteral stone       MDM  U/a pos for bld.        Linna Hoff, MD 02/04/12 1311

## 2012-02-04 NOTE — ED Notes (Signed)
Pt reports lower abdomen pain that radiates to the back with more than normal urination - treated 2 weeks ago for uti and has completed macrobid antibiotic. Continues to feel nauseated and have lower back pain

## 2012-02-14 ENCOUNTER — Other Ambulatory Visit: Payer: Self-pay

## 2012-02-14 LAB — TSH: Thyroid Stimulating Horm: 0.511 u[IU]/mL

## 2012-02-18 DIAGNOSIS — N281 Cyst of kidney, acquired: Secondary | ICD-10-CM | POA: Insufficient documentation

## 2012-02-18 DIAGNOSIS — Z87442 Personal history of urinary calculi: Secondary | ICD-10-CM | POA: Insufficient documentation

## 2012-02-18 DIAGNOSIS — R351 Nocturia: Secondary | ICD-10-CM | POA: Insufficient documentation

## 2012-02-19 ENCOUNTER — Ambulatory Visit: Payer: Self-pay | Admitting: Urology

## 2012-03-18 ENCOUNTER — Encounter: Payer: Self-pay | Admitting: Internal Medicine

## 2012-03-18 ENCOUNTER — Other Ambulatory Visit: Payer: Self-pay | Admitting: Family

## 2012-03-25 ENCOUNTER — Ambulatory Visit: Payer: Self-pay | Admitting: Gastroenterology

## 2012-04-23 ENCOUNTER — Encounter: Payer: Self-pay | Admitting: Internal Medicine

## 2012-04-27 ENCOUNTER — Encounter: Payer: Self-pay | Admitting: General Surgery

## 2012-05-05 DIAGNOSIS — Z87442 Personal history of urinary calculi: Secondary | ICD-10-CM | POA: Insufficient documentation

## 2012-05-08 ENCOUNTER — Ambulatory Visit: Payer: Self-pay | Admitting: Obstetrics and Gynecology

## 2012-05-26 ENCOUNTER — Encounter: Payer: Self-pay | Admitting: Cardiovascular Disease

## 2012-05-26 ENCOUNTER — Ambulatory Visit (INDEPENDENT_AMBULATORY_CARE_PROVIDER_SITE_OTHER): Payer: 59 | Admitting: Cardiovascular Disease

## 2012-05-26 VITALS — BP 120/80 | HR 62 | Ht 62.0 in | Wt 110.0 lb

## 2012-05-26 DIAGNOSIS — R002 Palpitations: Secondary | ICD-10-CM

## 2012-05-26 NOTE — Progress Notes (Signed)
HPI  This is a 42 year old pleasant female who is here today for a followup visit regarding palpitations and elevated blood pressure. The details of her presentation were listed in my previous note. She underwent cardiac evaluation which included a Holter monitor which showed sinus rhythm with occasional PVCs and some PACs. No other significant arrhythmias were noted. Ambulatory blood pressure monitor showed frequent elevation of blood pressure above 140/90 mostly during the day. The blood pressure went back quickly to normal. Most of her symptoms were attributed to anxiety. She was started on small dose Toprol for symptomatic relief which initially improved her symptoms but subsequently she felt fatigued. Currently she is not taking this medication. Anxiety seems to be better treated and she is having symptoms than before.  Allergies  Allergen Reactions  . Latex     Swelling and itching   . Penicillins     swelling  . Promethazine   . Shellfish Allergy     hives  . Sulfa Antibiotics     Rash and itching      Current Outpatient Prescriptions on File Prior to Visit  Medication Sig Dispense Refill  . ALPRAZolam (XANAX) 0.25 MG tablet Take 1 tablet (0.25 mg total) by mouth as needed for sleep or anxiety.  30 tablet  3  . citalopram (CELEXA) 10 MG tablet Take 10 mg by mouth daily.      . fexofenadine (ALLEGRA) 180 MG tablet Take 180 mg by mouth as needed.        No current facility-administered medications on file prior to visit.     Past Medical History  Diagnosis Date  . Obsessive compulsive disorder   . Breast cyst     left breast, follow up with Sankar  . Kidney stones   . Thyroid disease     hyper  . CHF (congestive heart failure)   . Lump or mass in breast     R-Breast     Past Surgical History  Procedure Laterality Date  . Dilation and curettage of uterus  2012    complicated by heart failure  . Abdominal hysterectomy  2012  . Cesarean section    . Upper  endoscopy w/ esophageal manometry    . Colonoscopy       Family History  Problem Relation Age of Onset  . Cancer Maternal Aunt     parotid Ca  . Stroke Paternal Aunt   . Cancer Paternal Uncle     lung, nasopharnygeal  . Hearing loss Maternal Grandmother   . Alcohol abuse Maternal Grandmother   . Arthritis Maternal Grandfather      History   Social History  . Marital Status: Married    Spouse Name: N/A    Number of Children: N/A  . Years of Education: N/A   Occupational History  . Not on file.   Social History Main Topics  . Smoking status: Never Smoker   . Smokeless tobacco: Never Used  . Alcohol Use: 0.6 oz/week    1 Glasses of wine per week     Comment: socially  . Drug Use: No  . Sexually Active: No   Other Topics Concern  . Not on file   Social History Narrative  . No narrative on file      PHYSICAL EXAM   BP 120/80  Pulse 62  Ht 5\' 2"  (1.575 m)  Wt 110 lb (49.896 kg)  BMI 20.11 kg/m2 Constitutional: She is oriented to person, place, and time.  She appears well-developed and well-nourished. No distress.  HENT: No nasal discharge.  Head: Normocephalic and atraumatic.  Eyes: Pupils are equal and round. Right eye exhibits no discharge. Left eye exhibits no discharge.  Neck: Normal range of motion. Neck supple. No JVD present. No thyromegaly present.  Cardiovascular: Normal rate, regular rhythm, normal heart sounds. Exam reveals no gallop and no friction rub. No murmur heard.  Pulmonary/Chest: Effort normal and breath sounds normal. No stridor. No respiratory distress. She has no wheezes. She has no rales. She exhibits no tenderness.  Abdominal: Soft. Bowel sounds are normal. She exhibits no distension. There is no tenderness. There is no rebound and no guarding.  Musculoskeletal: Normal range of motion. She exhibits no edema and no tenderness.  Neurological: She is alert and oriented to person, place, and time. Coordination normal.  Skin: Skin is warm  and dry. No rash noted. She is not diaphoretic. No erythema. No pallor.  Psychiatric: She has a normal mood and affect. Her behavior is normal. Judgment and thought content normal.    EKG: Normal sinus rhythm with no significant ST or T wave changes.  ASSESSMENT AND PLAN

## 2012-05-26 NOTE — Patient Instructions (Addendum)
Follow up as needed

## 2012-05-26 NOTE — Assessment & Plan Note (Signed)
Her symptoms improved significantly after treatment of her anxiety. Cardiac workup has been reassuring. I agree with using propranolol only as needed. Followup with [place] as needed.

## 2012-06-16 ENCOUNTER — Encounter: Payer: Self-pay | Admitting: Internal Medicine

## 2012-06-16 ENCOUNTER — Ambulatory Visit (INDEPENDENT_AMBULATORY_CARE_PROVIDER_SITE_OTHER): Payer: 59 | Admitting: Internal Medicine

## 2012-06-16 VITALS — BP 120/68 | HR 59 | Temp 98.3°F | Resp 14 | Ht 62.0 in | Wt 112.5 lb

## 2012-06-16 DIAGNOSIS — F429 Obsessive-compulsive disorder, unspecified: Secondary | ICD-10-CM

## 2012-06-16 DIAGNOSIS — Z0000 Encounter for general adult medical examination without abnormal findings: Secondary | ICD-10-CM

## 2012-06-16 DIAGNOSIS — Z13220 Encounter for screening for lipoid disorders: Secondary | ICD-10-CM

## 2012-06-16 DIAGNOSIS — N6009 Solitary cyst of unspecified breast: Secondary | ICD-10-CM

## 2012-06-16 DIAGNOSIS — J3489 Other specified disorders of nose and nasal sinuses: Secondary | ICD-10-CM

## 2012-06-16 DIAGNOSIS — F341 Dysthymic disorder: Secondary | ICD-10-CM

## 2012-06-16 DIAGNOSIS — R5381 Other malaise: Secondary | ICD-10-CM

## 2012-06-16 DIAGNOSIS — R0981 Nasal congestion: Secondary | ICD-10-CM

## 2012-06-16 DIAGNOSIS — F418 Other specified anxiety disorders: Secondary | ICD-10-CM

## 2012-06-16 NOTE — Progress Notes (Signed)
Patient ID: Connie Francis, female   DOB: Sep 04, 1970, 42 y.o.   MRN: [id number]    Subjective:     Connie Francis is a 42 y.o. female and is here for a comprehensive physical exam. The patient reports no problems.   She saw Dr. Greggory Keen for her annual GYN exam.  No pelvic was done bc it was done in Oct 2103,  And she is s/p total hysterectomy .  She has had her annual breast exam by Dr. Evette Cristal who follows her for breast cysts.   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  Her episodes of somatic complaints requiring frequent utilization of healthcare services has decreased since she started working from home.   She is tolerating  celexa,  priior trial of remeron caused too much sedation even at lowest dose.  She has a histroy of a deviated septum,  And her right maxillary sinus has been feeling congested persistently   Has resumed nasonex on Sunday  .    History   Social History  . Marital Status: Married    Spouse Name: N/A    Number of Children: N/A  . Years of Education: N/A   Occupational History  . Not on file.   Social History Main Topics  . Smoking status: Never Smoker   . Smokeless tobacco: Never Used  . Alcohol Use: 0.6 oz/week    1 Glasses of wine per week     Comment: socially  . Drug Use: No  . Sexually Active: No   Other Topics Concern  . Not on file   Social History Narrative  . No narrative on file   Health Maintenance  Topic Date Due  . Tetanus/tdap  08/11/1991  . Pap Smear  01/13/2012  . Influenza Vaccine  09/14/2012    The following portions of the patient's history were reviewed and updated as appropriate: allergies, current medications, past family history, past medical history, past social history, past surgical history and problem list.  Review of Systems A comprehensive review of systems was negative.   Objective:  BP 120/68  Pulse 59  Temp(Src) 98.3 F (36.8 C) (Oral)   Resp 14  Ht 5\' 2"  (1.575 m)  Wt 112 lb 8 oz (51.03 kg)  BMI 20.57 kg/m2  SpO2 98%  General appearance: alert, cooperative and appears stated age Ears: normal TM's and external ear canals both ears Throat: lips, mucosa, and tongue normal; teeth and gums normal Neck: no adenopathy, no carotid bruit, supple, symmetrical, trachea midline and thyroid not enlarged, symmetric, no tenderness/mass/nodules Back: symmetric, no curvature. ROM normal. No CVA tenderness. Lungs: clear to auscultation bilaterally Heart: regular rate and rhythm, S1, S2 normal, no murmur, click, rub or gallop Abdomen: soft, non-tender; bowel sounds normal; no masses,  no organomegaly Pulses: 2+ and symmetric Skin: Skin color, texture, turgor normal. No rashes or lesions Lymph nodes: Cervical, supraclavicular, and axillary nodes normal. Neuro: grossly nonfocal.    Assessment:   Obsessive compulsive disorder Controlled now with citalopram.   Depression with anxiety Controlled with citalpram and frequent follow up with Dr. Maryruth Bun.   Breast cyst Managed with annual and prn exam by [facility].  She does not need a mammogram.   Sinus congestion Secondary to allergic rhinitis and deviated septum.  Nasonex, saline flushes  Routine general medical examination at a health care facility Annual comprehensive exam was done excluding breast, pelvic and PAP smear. All screenings have  been addressed .    Updated Medication List Outpatient Encounter Prescriptions as of 06/16/2012  Medication Sig Dispense Refill  . ALPRAZolam (XANAX) 0.25 MG tablet Take 1 tablet (0.25 mg total) by mouth as needed for sleep or anxiety.  30 tablet  3  . citalopram (CELEXA) 10 MG tablet Take 10 mg by mouth daily.      . fexofenadine (ALLEGRA) 180 MG tablet Take 180 mg by mouth as needed.        No facility-administered encounter medications on file as of 06/16/2012.

## 2012-06-16 NOTE — Patient Instructions (Signed)
flush with simply saline first, then lie down and squirt the nasonex  In   Can also try sudafed PE  10 mg every 6 hours as needed for congestion.,

## 2012-06-18 ENCOUNTER — Ambulatory Visit (INDEPENDENT_AMBULATORY_CARE_PROVIDER_SITE_OTHER): Payer: 59 | Admitting: General Surgery

## 2012-06-18 ENCOUNTER — Encounter: Payer: Self-pay | Admitting: General Surgery

## 2012-06-18 VITALS — BP 108/68 | HR 78 | Resp 12 | Ht 62.0 in | Wt 111.0 lb

## 2012-06-18 DIAGNOSIS — N644 Mastodynia: Secondary | ICD-10-CM

## 2012-06-18 DIAGNOSIS — Z0000 Encounter for general adult medical examination without abnormal findings: Secondary | ICD-10-CM | POA: Insufficient documentation

## 2012-06-18 DIAGNOSIS — R0981 Nasal congestion: Secondary | ICD-10-CM | POA: Insufficient documentation

## 2012-06-18 DIAGNOSIS — N6019 Diffuse cystic mastopathy of unspecified breast: Secondary | ICD-10-CM

## 2012-06-18 DIAGNOSIS — Z0001 Encounter for general adult medical examination with abnormal findings: Secondary | ICD-10-CM | POA: Insufficient documentation

## 2012-06-18 NOTE — Assessment & Plan Note (Signed)
Annual comprehensive exam was done excluding breast, pelvic and PAP smear. All screenings have been addressed .  

## 2012-06-18 NOTE — Assessment & Plan Note (Signed)
Secondary to allergic rhinitis and deviated septum.  Nasonex, saline flushes

## 2012-06-18 NOTE — Assessment & Plan Note (Signed)
Managed with annual and prn exam by [facility].  She does not need a mammogram.

## 2012-06-18 NOTE — Assessment & Plan Note (Signed)
Controlled now with citalopram.

## 2012-06-18 NOTE — Progress Notes (Signed)
Patient ID: Connie Francis, female   DOB: 08-Feb-1970, 42 y.o.   MRN: [id number]  Chief Complaint  Patient presents with  . Follow-up    breast ultrasound    HPI Connie Francis is a 42 y.o. female who presents for a follow up breast ultrasound. The patient states the lump is in the right breast. She was previously seen for this back in December 2013. No new complaints at this time.  HPI  Past Medical History  Diagnosis Date  . Obsessive compulsive disorder   . Breast cyst     left breast, follow up with Lise Pincus  . Kidney stones   . Thyroid disease     hyper  . CHF (congestive heart failure)   . Lump or mass in breast     R-Breast    Past Surgical History  Procedure Laterality Date  . Dilation and curettage of uterus  2012    complicated by heart failure  . Abdominal hysterectomy  2012  . Cesarean section    . Upper endoscopy w/ esophageal manometry    . Colonoscopy      Family History  Problem Relation Age of Onset  . Cancer Maternal Aunt     parotid Ca  . Stroke Paternal Aunt   . Cancer Paternal Uncle     lung, nasopharnygeal  . Hearing loss Maternal Grandmother   . Alcohol abuse Maternal Grandmother   . Arthritis Maternal Grandfather     Social History History  Substance Use Topics  . Smoking status: Never Smoker   . Smokeless tobacco: Never Used  . Alcohol Use: 0.6 oz/week    1 Glasses of wine per week     Comment: socially    Allergies  Allergen Reactions  . Latex     Swelling and itching   . Penicillins     swelling  . Promethazine   . Shellfish Allergy     hives  . Sulfa Antibiotics     Rash and itching     Current Outpatient Prescriptions  Medication Sig Dispense Refill  . ALPRAZolam (XANAX) 0.25 MG tablet Take 1 tablet (0.25 mg total) by mouth as needed for sleep or anxiety.  30 tablet  3  . citalopram (CELEXA) 10 MG tablet Take 10 mg by mouth daily.      . fexofenadine (ALLEGRA) 180 MG tablet Take 180 mg by mouth as needed.       . mometasone  (NASONEX) 50 MCG/ACT nasal spray Place 2 sprays into the nose daily.       No current facility-administered medications for this visit.    Review of Systems Review of Systems  Constitutional: Negative.   Respiratory: Negative.   Cardiovascular: Negative.     Blood pressure 108/68, pulse 78, resp. rate 12, height 5\' 2"  (1.575 m), weight 111 lb (50.349 kg).  Physical Exam Physical Exam  Constitutional: She appears well-developed and well-nourished.  Neck: Trachea normal. No mass and no thyromegaly present.  Pulmonary/Chest: Right breast exhibits no inverted nipple, no mass, no nipple discharge and no skin change. Left breast exhibits no inverted nipple, no mass, no nipple discharge and no skin change. Breasts are symmetrical.  Lymphadenopathy:    She has no cervical adenopathy.    She has no axillary adenopathy.    Data Reviewed None  Assessment    Last mammogram was over 1 year ago. Breast exam is unremarkable.      Plan    Bilateral Screening mammogram in August 2014. OV  after       [facility] G 06/18/2012, 8:25 PM

## 2012-06-18 NOTE — Patient Instructions (Addendum)
Patient to have Bilateral Screening Mammogram in August 2014 with follow up appointment afterwards.

## 2012-06-18 NOTE — Assessment & Plan Note (Signed)
Controlled with citalpram and frequent follow up with Dr. Maryruth Bun.

## 2012-06-23 ENCOUNTER — Ambulatory Visit: Payer: Self-pay

## 2012-06-23 LAB — RAPID STREP-A WITH REFLX: Micro Text Report: NEGATIVE

## 2012-06-26 LAB — BETA STREP CULTURE(ARMC)

## 2012-07-03 ENCOUNTER — Other Ambulatory Visit (INDEPENDENT_AMBULATORY_CARE_PROVIDER_SITE_OTHER): Payer: 59

## 2012-07-03 ENCOUNTER — Encounter: Payer: Self-pay | Admitting: Internal Medicine

## 2012-07-03 DIAGNOSIS — E876 Hypokalemia: Secondary | ICD-10-CM

## 2012-07-03 DIAGNOSIS — E069 Thyroiditis, unspecified: Secondary | ICD-10-CM

## 2012-07-03 DIAGNOSIS — Z13220 Encounter for screening for lipoid disorders: Secondary | ICD-10-CM

## 2012-07-03 DIAGNOSIS — R5381 Other malaise: Secondary | ICD-10-CM

## 2012-07-03 LAB — T4, FREE: Free T4: 0.93 ng/dL (ref 0.60–1.60)

## 2012-07-03 LAB — BASIC METABOLIC PANEL
BUN: 11 mg/dL (ref 6–23)
Chloride: 104 mEq/L (ref 96–112)
Creatinine, Ser: 0.7 mg/dL (ref 0.4–1.2)
GFR: 107.36 mL/min (ref >60.00)

## 2012-07-03 LAB — LIPID PANEL
HDL: 62.1 mg/dL (ref >39.00)
LDL Cholesterol: 113 mg/dL — ABNORMAL HIGH (ref 0–99)
Total CHOL/HDL Ratio: 3
Triglycerides: 72 mg/dL (ref 0.0–149.0)

## 2012-07-03 LAB — MAGNESIUM: Magnesium: 2.2 mg/dL (ref 1.5–2.5)

## 2012-07-03 NOTE — Addendum Note (Signed)
Addended by: Montine Circle D on: 07/03/2012 08:18 AM   Modules accepted: Orders

## 2012-07-03 NOTE — Addendum Note (Signed)
Addended by: Montine Circle D on: 07/03/2012 08:25 AM   Modules accepted: Orders

## 2012-07-04 LAB — CBC WITH DIFFERENTIAL/PLATELET
Basophils Absolute: 0 10*3/uL (ref 0.0–0.1)
Basophils Relative: 0.2 % (ref 0.0–3.0)
Eosinophils Absolute: 0.1 10*3/uL (ref 0.0–0.7)
HCT: 35.7 % — ABNORMAL LOW (ref 36.0–46.0)
Hemoglobin: 12.4 g/dL (ref 12.0–15.0)
Lymphocytes Relative: 30.8 % (ref 12.0–46.0)
Lymphs Abs: 1.7 10*3/uL (ref 0.7–4.0)
MCHC: 34.7 g/dL (ref 30.0–36.0)
Monocytes Relative: 6.4 % (ref 3.0–12.0)
Neutro Abs: 3.4 10*3/uL (ref 1.4–7.7)
RBC: 3.94 Mil/uL (ref 3.87–5.11)
RDW: 12.7 % (ref 11.5–14.6)

## 2012-07-06 ENCOUNTER — Encounter: Payer: Self-pay | Admitting: Internal Medicine

## 2012-07-06 LAB — HM MAMMOGRAPHY: HM Mammogram: NORMAL

## 2012-08-06 ENCOUNTER — Ambulatory Visit: Payer: Self-pay

## 2012-08-13 ENCOUNTER — Encounter: Payer: Self-pay | Admitting: Internal Medicine

## 2012-08-13 ENCOUNTER — Ambulatory Visit: Payer: Self-pay | Admitting: Internal Medicine

## 2012-08-13 ENCOUNTER — Ambulatory Visit (INDEPENDENT_AMBULATORY_CARE_PROVIDER_SITE_OTHER): Payer: 59 | Admitting: Internal Medicine

## 2012-08-13 VITALS — BP 126/82 | HR 65 | Temp 98.1°F | Resp 14 | Wt 116.8 lb

## 2012-08-13 DIAGNOSIS — M542 Cervicalgia: Secondary | ICD-10-CM

## 2012-08-13 DIAGNOSIS — R51 Headache: Secondary | ICD-10-CM

## 2012-08-13 MED ORDER — GABAPENTIN 100 MG PO CAPS: ORAL_CAPSULE | ORAL | Status: DC

## 2012-08-13 MED ORDER — DICLOFENAC SODIUM 1 % TD GEL: 2.0000 g | Freq: Four times a day (QID) | TRANSDERMAL | Status: DC

## 2012-08-13 NOTE — Patient Instructions (Addendum)
I agree with Dr Thurmond Butts the the headaches may be from an occipital nerve that is irritated from degenerative chagnes in your cervical spine  Plain films  of cervical spine  Trial of voltaren gel ,  It Can be applied to neck and back of head 4 times daily \ voltaren is a nonsteroidal so do NOT combine with aleve or ibuprofen Ok to take tylenol with it  If needed  Adding gabapentin100 mg at bedtime,  Can increase to 300 mg if it helps

## 2012-08-13 NOTE — Progress Notes (Signed)
Patient ID: Connie Francis, female   DOB: [birth year], 42 y.o.   MRN: [id number]  Patient Active Problem List   Diagnosis Date Noted  . Cervicalgia 08/14/2012  . Routine general medical examination at a health care facility 06/18/2012  . Diffuse cystic mastopathy 06/18/2012  . Depression with anxiety 01/07/2012  . Irritable bowel syndrome with constipation 12/06/2011  . Breast cyst   . Obsessive compulsive disorder   . Postpartum cardiomyopathy 09/13/2010    Subjective:  CC:   Chief Complaint  Patient presents with  . Acute Visit    Headaches X 2 months.    HPI:   Connie Cainis a 42 y.o. female who presents with persistent headaches since June,  Initially  attributed to otitis media, treated initially by urgent care with prednisone,  Then suprax was added  Followed by development of fever , so abx switched to doxy empirically .  Then saw [facility]( ENT)  Who  Repeated a 6 day prednisone taper and continued the doxycycline .  Symptoms resolved for nearly 3 weeks,  Then started returning last week,  With fullness on the right side of the head accompanied by pounding. Headache became really severe last Thursday despite usng the allegra d and ibuprofen 600 mg every 8 hours for the entire week along with tylenol.  Saw urgent care on Thursday  ,  No signs of sinusitis ,  Dr Thurmond Butts diagnosed occiptial neuralgia and offered cortisone shot but she deferred.  Gave her another prednisone taper.  Her headaches improved initially at the higher doses but worsened as the taper continued. She able she reports trapezius muscle and paraspinous muscle achiness but denies numbness or tingling or weakness in the shoulders or upper extremities.    Past Medical History  Diagnosis Date  . Obsessive compulsive disorder   . Breast cyst     left breast, follow up with Sankar  . Kidney stones   . Thyroid disease     hyper  . CHF (congestive heart failure)   . Lump or mass in breast     R-Breast    Past Surgical  History  Procedure Laterality Date  . Dilation and curettage of uterus  2012    complicated by heart failure  . Abdominal hysterectomy  2012  . Cesarean section    . Upper endoscopy w/ esophageal manometry    . Colonoscopy         The following portions of the patient's history were reviewed and updated as appropriate: Allergies, current medications, and problem list.    Review of Systems:   Patient denies fevers, malaise, unintentional weight loss, skin rash, eye pain, sinus congestion and sinus pain, sore throat, dysphagia,  hemoptysis , cough, dyspnea, wheezing, chest pain, palpitations, orthopnea, edema, abdominal pain, nausea, melena, diarrhea, constipation, flank pain, dysuria, hematuria, urinary  Frequency, nocturia, numbness, tingling, seizures,  Focal weakness, Loss of consciousness,  Tremor, insomnia, depression, anxiety, and suicidal ideation.     History   Social History  . Marital Status: Married    Spouse Name: N/A    Number of Children: N/A  . Years of Education: N/A   Occupational History  . Not on file.   Social History Main Topics  . Smoking status: Never Smoker   . Smokeless tobacco: Never Used  . Alcohol Use: 0.6 oz/week    1 Glasses of wine per week     Comment: socially  . Drug Use: No  . Sexually Active: No   Other Topics  Concern  . Not on file   Social History Narrative  . No narrative on file    Objective:  BP 126/82  Pulse 65  Temp(Src) 98.1 F (36.7 C) (Oral)  Resp 14  Wt 116 lb 12 oz (52.957 kg)  BMI 21.35 kg/m2  SpO2 99%  General appearance: alert, cooperative and appears stated age Ears: normal TM's and external ear canals both ears, no frontal , ethmoid or maxillary sinus tenderness S. Throat: lips, mucosa, and tongue normal; teeth and gums normal Neck:  Tenderness over trapezius muscles and paraspinous muscles, no adenopathy, no carotid bruit, supple, symmetrical, trachea midline and thyroid not enlarged, symmetric, no  tenderness/mass/nodules Back: symmetric, no curvature. ROM normal. No CVA tenderness. Lungs: clear to auscultation bilaterally Heart: regular rate and rhythm, S1, S2 normal, no murmur, click, rub or gallop Abdomen: soft, non-tender; bowel sounds normal; no masses,  no organomegaly Pulses: 2+ and symmetric Skin: Skin color, texture, turgor normal. No rashes or lesions Lymph nodes: Cervical, supraclavicular, and axillary nodes normal.  Assessment and Plan:  Cervicalgia ENT exam is completely normal.  Agree that current headache syndrome  appears to be unrelated to sinus or eustachian tube dysfunction and more likely due to pain emanating from arthritis due to cervical spine degenerative changes. plain films ordered. She has had 3 prednisone tapers in the last 4 weeks. I have prescribed Neurontin 100 mg at  bedtime with increase to 300 if needed along with Tylenol and Voltaren gel if available. She understands that is she uses Voltaren gel she should not use ibuprofen.   Updated Medication List Outpatient Encounter Prescriptions as of 08/13/2012  Medication Sig Dispense Refill  . ALPRAZolam (XANAX) 0.25 MG tablet Take 1 tablet (0.25 mg total) by mouth as needed for sleep or anxiety.  30 tablet  3  . citalopram (CELEXA) 10 MG tablet Take 10 mg by mouth daily.      . fexofenadine (ALLEGRA) 180 MG tablet Take 180 mg by mouth as needed.       . mometasone (NASONEX) 50 MCG/ACT nasal spray Place 2 sprays into the nose daily.      . diclofenac sodium (VOLTAREN) 1 % GEL Apply 2 g topically 4 (four) times daily.  100 g  11  . gabapentin (NEURONTIN) 100 MG capsule 1 to 3 capsules daily at bedtime as needed for neck pain  90 capsule  3   No facility-administered encounter medications on file as of 08/13/2012.     Orders Placed This Encounter  Procedures  . DG Cervical Spine Complete  . Rocky mtn spotted fvr abs pnl(IgG+IgM)    No Follow-up on file.

## 2012-08-14 ENCOUNTER — Encounter: Payer: Self-pay | Admitting: Internal Medicine

## 2012-08-14 DIAGNOSIS — M542 Cervicalgia: Secondary | ICD-10-CM | POA: Insufficient documentation

## 2012-08-14 LAB — ROCKY MTN SPOTTED FVR ABS PNL(IGG+IGM): RMSF IgG: 0.18 IV

## 2012-08-14 NOTE — Assessment & Plan Note (Addendum)
ENT exam is completely normal.  Agree that current headache syndrome  appears to be unrelated to sinus or eustachian tube dysfunction and more likely due to pain emanating from arthritis due to cervical spine degenerative changes. plain films ordered. She has had 3 prednisone tapers in the last 4 weeks. I have prescribed Neurontin 100 mg at  bedtime with increase to 300 if needed along with Tylenol and Voltaren gel if available. She understands that is she uses Voltaren gel she should not use ibuprofen.

## 2012-08-17 ENCOUNTER — Telehealth: Payer: Self-pay | Admitting: Internal Medicine

## 2012-08-17 NOTE — Telephone Encounter (Signed)
Her plain x rays of the cervical spin were relatively normal. If her pain contineus we will schedule an MRI

## 2012-08-18 NOTE — Telephone Encounter (Signed)
Patient notified as requested patient will call if pain continues.

## 2012-08-24 ENCOUNTER — Encounter: Payer: Self-pay | Admitting: Internal Medicine

## 2012-09-03 ENCOUNTER — Ambulatory Visit: Payer: Self-pay | Admitting: General Surgery

## 2012-09-04 ENCOUNTER — Encounter: Payer: Self-pay | Admitting: General Surgery

## 2012-09-07 ENCOUNTER — Encounter: Payer: Self-pay | Admitting: Internal Medicine

## 2012-09-08 ENCOUNTER — Encounter: Payer: Self-pay | Admitting: General Surgery

## 2012-09-08 ENCOUNTER — Ambulatory Visit (INDEPENDENT_AMBULATORY_CARE_PROVIDER_SITE_OTHER): Payer: 59 | Admitting: General Surgery

## 2012-09-08 VITALS — BP 118/68 | HR 78 | Resp 12 | Ht 62.0 in | Wt 119.0 lb

## 2012-09-08 DIAGNOSIS — N6019 Diffuse cystic mastopathy of unspecified breast: Secondary | ICD-10-CM

## 2012-09-08 MED ORDER — AMITRIPTYLINE HCL 25 MG PO TABS: 25.0000 mg | ORAL_TABLET | Freq: Every day | ORAL | Status: DC

## 2012-09-08 NOTE — Patient Instructions (Addendum)
Follow up in one year with bilateral screening mammogram and office visit. Continue self breast exams. Call office for any new breast issues or concerns. 

## 2012-09-08 NOTE — Progress Notes (Signed)
Patient ID: Connie Francis, female   DOB: 01/18/1970, 42 y.o.   MRN: [id number]  Chief Complaint  Patient presents with  . Other    mammogram    HPI Connie Francis is a 42 y.o. female who presents for a follow up breast evaluation. The most recent mammogram was done on 09-03-12.  Patient does perform regular self breast checks and gets regular mammograms done.  No new breast issues.  HPI  Past Medical History  Diagnosis Date  . Obsessive compulsive disorder   . Breast cyst     left breast, follow up with Sankar  . Kidney stones   . Thyroid disease     hyper  . CHF (congestive heart failure)   . Lump or mass in breast     R-Breast    Past Surgical History  Procedure Laterality Date  . Dilation and curettage of uterus  2012    complicated by heart failure  . Abdominal hysterectomy  2012  . Cesarean section    . Upper endoscopy w/ esophageal manometry    . Colonoscopy      Family History  Problem Relation Age of Onset  . Cancer Maternal Aunt     parotid Ca  . Stroke Paternal Aunt   . Cancer Paternal Uncle     lung, nasopharnygeal  . Hearing loss Maternal Grandmother   . Alcohol abuse Maternal Grandmother   . Arthritis Maternal Grandfather     Social History History  Substance Use Topics  . Smoking status: Never Smoker   . Smokeless tobacco: Never Used  . Alcohol Use: 0.6 oz/week    1 Glasses of wine per week     Comment: socially    Allergies  Allergen Reactions  . Latex     Swelling and itching   . Penicillins     swelling  . Promethazine   . Shellfish Allergy     hives  . Sulfa Antibiotics     Rash and itching     Current Outpatient Prescriptions  Medication Sig Dispense Refill  . ALPRAZolam (XANAX) 0.25 MG tablet Take 1 tablet (0.25 mg total) by mouth as needed for sleep or anxiety.  30 tablet  3  . amitriptyline (ELAVIL) 25 MG tablet Take 1 tablet (25 mg total) by mouth at bedtime.  30 tablet  3  . citalopram (CELEXA) 10 MG tablet Take 10 mg by  mouth daily.      . diclofenac sodium (VOLTAREN) 1 % GEL Apply 2 g topically 4 (four) times daily.  100 g  11  . fexofenadine (ALLEGRA) 180 MG tablet Take 180 mg by mouth as needed.       . gabapentin (NEURONTIN) 100 MG capsule 1 to 3 capsules daily at bedtime as needed for neck pain  90 capsule  3  . mometasone (NASONEX) 50 MCG/ACT nasal spray Place 2 sprays into the nose daily.       No current facility-administered medications for this visit.    Review of Systems Review of Systems  Constitutional: Negative.   Respiratory: Negative.   Cardiovascular: Negative.     Blood pressure 118/68, pulse 78, resp. rate 12, height 5\' 2"  (1.575 m), weight 119 lb (53.978 kg).  Physical Exam Physical Exam  Constitutional: She is oriented to person, place, and time. She appears well-developed and well-nourished.  Eyes: Conjunctivae are normal.  Neck: Neck supple.  Cardiovascular: Normal rate, regular rhythm and normal heart sounds.   Pulmonary/Chest: Effort normal and breath sounds  normal. Right breast exhibits no inverted nipple, no mass, no nipple discharge, no skin change and no tenderness. Left breast exhibits no inverted nipple, no mass, no nipple discharge, no skin change and no tenderness.  Abdominal: Soft. There is no hepatosplenomegaly. There is no tenderness.  Lymphadenopathy:    She has no cervical adenopathy.    She has no axillary adenopathy.  Neurological: She is alert and oriented to person, place, and time.  Skin: Skin is warm and dry.    Data Reviewed Mammogram reviewed  Assessment    Stable exam. History of breast cysts.      Plan    Follow up in one year with bilateral screening mammogram and office visit.        SANKAR,SEEPLAPUTHUR G 09/08/2012, 9:02 PM

## 2012-09-18 ENCOUNTER — Encounter: Payer: Self-pay | Admitting: Internal Medicine

## 2012-10-26 ENCOUNTER — Encounter: Payer: Self-pay | Admitting: Internal Medicine

## 2012-10-26 ENCOUNTER — Ambulatory Visit (INDEPENDENT_AMBULATORY_CARE_PROVIDER_SITE_OTHER): Payer: 59 | Admitting: Internal Medicine

## 2012-10-26 VITALS — BP 122/88 | HR 67 | Temp 98.6°F | Wt 121.0 lb

## 2012-10-26 DIAGNOSIS — J3489 Other specified disorders of nose and nasal sinuses: Secondary | ICD-10-CM

## 2012-10-26 DIAGNOSIS — R51 Headache: Secondary | ICD-10-CM

## 2012-10-26 DIAGNOSIS — R42 Dizziness and giddiness: Secondary | ICD-10-CM

## 2012-10-26 NOTE — Progress Notes (Signed)
Patient ID: Connie Francis, female   DOB: 08-27-1970, 42 y.o.   MRN: [id number]   Patient Active Problem List   Diagnosis Date Noted  . Dizziness and giddiness 10/27/2012  . Cervicalgia 08/14/2012  . Routine general medical examination at a health care facility 06/18/2012  . Diffuse cystic mastopathy 06/18/2012  . Depression with anxiety 01/07/2012  . Irritable bowel syndrome with constipation 12/06/2011  . Breast cyst   . Obsessive compulsive disorder   . Postpartum cardiomyopathy 09/13/2010    Subjective:  CC:   Chief Complaint  Patient presents with  . Dizzy spells    Having dizzy spells mostly when she is working out and if she make any quick movement. Was taking antibiotics because she had fluid behind her ears but she is still having the dizzy spell.   Marland Kitchen Headache    Still having the headaches as she discussed at her last office visit.     HPI:   Connie Cainis a 42 y.o. female who presents with  Recurrent dizzy spells with sudden position change.   treated for otitis media by Urgent Care two weeks ago,  Symptoms persist and still having headaches with visual changes.  The headaches  Have occurring on the right side of head .  Prior ENT evaluation by Marion Downer suggested CT of the sinuses if symptoms persisted.      Past Medical History  Diagnosis Date  . Obsessive compulsive disorder   . Breast cyst     left breast, follow up with Sankar  . Kidney stones   . Thyroid disease     hyper  . CHF (congestive heart failure)   . Lump or mass in breast     R-Breast    Past Surgical History  Procedure Laterality Date  . Dilation and curettage of uterus  2012    complicated by heart failure  . Abdominal hysterectomy  2012  . Cesarean section    . Upper endoscopy w/ esophageal manometry    . Colonoscopy         The following portions of the patient's history were reviewed and updated as appropriate: Allergies, current medications, and problem list.    Review of  Systems:   Patient denies headache, fevers, malaise, unintentional weight loss, skin rash, eye pain, sinus congestion and sinus pain, sore throat, dysphagia,  hemoptysis , cough, dyspnea, wheezing, chest pain, palpitations, orthopnea, edema, abdominal pain, nausea, melena, diarrhea, constipation, flank pain, dysuria, hematuria, urinary  Frequency, nocturia, numbness, tingling, seizures,  Focal weakness, Loss of consciousness,  Tremor, insomnia, depression, anxiety, and suicidal ideation.     History   Social History  . Marital Status: Married    Spouse Name: N/A    Number of Children: N/A  . Years of Education: N/A   Occupational History  . Not on file.   Social History Main Topics  . Smoking status: Never Smoker   . Smokeless tobacco: Never Used  . Alcohol Use: 0.6 oz/week    1 Glasses of wine per week     Comment: socially  . Drug Use: No  . Sexual Activity: No   Other Topics Concern  . Not on file   Social History Narrative  . No narrative on file    Objective:  Filed Vitals:   10/26/12 1600  BP: 122/88  Pulse: 67  Temp: 98.6 F (37 C)     General appearance: alert, cooperative and appears stated age Ears: normal TM's and external ear canals both  ears Throat: lips, mucosa, and tongue normal; teeth and gums normal Neck: no adenopathy, no carotid bruit, supple, symmetrical, trachea midline and thyroid not enlarged, symmetric, no tenderness/mass/nodules Back: symmetric, no curvature. ROM normal. No CVA tenderness. Lungs: clear to auscultation bilaterally Heart: regular rate and rhythm, S1, S2 normal, no murmur, click, rub or gallop Abdomen: soft, non-tender; bowel sounds normal; no masses,  no organomegaly Pulses: 2+ and symmetric Skin: Skin color, texture, turgor normal. No rashes or lesions Lymph nodes: Cervical, supraclavicular, and axillary nodes normal. Neuro: nofocal exam   Assessment and Plan:  Dizziness and giddiness Persistent, secondary to sinus  dizziness, accompanied by right sided headache.  Exam is suggestive of sinus disease.   CT sinuses ordered   Updated Medication List Outpatient Encounter Prescriptions as of 10/26/2012  Medication Sig Dispense Refill  . ALPRAZolam (XANAX) 0.25 MG tablet Take 1 tablet (0.25 mg total) by mouth as needed for sleep or anxiety.  30 tablet  3  . amitriptyline (ELAVIL) 25 MG tablet Take 1 tablet (25 mg total) by mouth at bedtime.  30 tablet  3  . citalopram (CELEXA) 10 MG tablet Take 10 mg by mouth daily.      . fexofenadine (ALLEGRA) 180 MG tablet Take 180 mg by mouth as needed.       . mometasone (NASONEX) 50 MCG/ACT nasal spray Place 2 sprays into the nose daily.      . diclofenac sodium (VOLTAREN) 1 % GEL Apply 2 g topically 4 (four) times daily.  100 g  11  . gabapentin (NEURONTIN) 100 MG capsule 1 to 3 capsules daily at bedtime as needed for neck pain  90 capsule  3   No facility-administered encounter medications on file as of 10/26/2012.     Orders Placed This Encounter  Procedures  . CT Maxillofacial WO CM    No Follow-up on file.

## 2012-10-27 ENCOUNTER — Encounter: Payer: Self-pay | Admitting: Internal Medicine

## 2012-10-27 DIAGNOSIS — R42 Dizziness and giddiness: Secondary | ICD-10-CM | POA: Insufficient documentation

## 2012-10-27 NOTE — Assessment & Plan Note (Signed)
Persistent, secondary to sinus dizziness, accompanied by right sided headache  CT sinuses ordered

## 2012-10-28 ENCOUNTER — Ambulatory Visit: Payer: Self-pay | Admitting: Internal Medicine

## 2012-10-29 ENCOUNTER — Telehealth: Payer: Self-pay | Admitting: Internal Medicine

## 2012-10-29 NOTE — Telephone Encounter (Signed)
CT sinuses shows a possible polyp in the right maxillary sinus,  Not the frontal sinus,  And a  mildly impacted  molar on the left. No signs of chronic sinus infectiosn to explain headache

## 2012-10-30 NOTE — Telephone Encounter (Signed)
Called and advised patient of results

## 2012-11-06 ENCOUNTER — Encounter: Payer: Self-pay | Admitting: Internal Medicine

## 2012-11-13 ENCOUNTER — Encounter: Payer: Self-pay | Admitting: Internal Medicine

## 2012-11-19 ENCOUNTER — Other Ambulatory Visit: Payer: Self-pay

## 2012-11-25 ENCOUNTER — Encounter: Payer: Self-pay | Admitting: Internal Medicine

## 2012-11-25 ENCOUNTER — Ambulatory Visit (INDEPENDENT_AMBULATORY_CARE_PROVIDER_SITE_OTHER): Payer: 59 | Admitting: Internal Medicine

## 2012-11-25 VITALS — BP 148/80 | HR 64 | Temp 97.4°F | Resp 12 | Ht 62.0 in | Wt 118.2 lb

## 2012-11-25 DIAGNOSIS — F458 Other somatoform disorders: Secondary | ICD-10-CM

## 2012-11-25 NOTE — Progress Notes (Signed)
Patient ID: Connie Francis, female   DOB: 26-Jan-1970, 42 y.o.   MRN: [id number]   Patient Active Problem List   Diagnosis Date Noted  . Psychogenic dyspepsia 11/27/2012  . Dizziness and giddiness 10/27/2012  . Cervicalgia 08/14/2012  . Routine general medical examination at a health care facility 06/18/2012  . Diffuse cystic mastopathy 06/18/2012  . Depression with anxiety 01/07/2012  . Irritable bowel syndrome with constipation 12/06/2011  . Breast cyst   . Obsessive compulsive disorder   . Postpartum cardiomyopathy 09/13/2010    Subjective:  CC:   Chief Complaint  Patient presents with  . Abdominal Pain    Epi gastric pain, coughing non productive symptom on set after eating.    HPI:   Connie Cainis a 42 y.o. female who presents Post prandial bloating accompanied by nonproductive cough after she eats. Had symptoms previously (less than a year ago) and had comprehensive workup including a gastric emptying study, barium swallow, and EGD,  All were normal.  Symptoms returned after psychiatrist changed her SSRI from celexa to zoloft for increased anxiety/OCD. Current dose is 50 mg.  She is tearful,  And frustrated at constantly feeling poorly. No nausea, regurgitation,  Or weight loss.      Past Medical History  Diagnosis Date  . Obsessive compulsive disorder   . Breast cyst     left breast, follow up with Sankar  . Kidney stones   . Thyroid disease     hyper  . CHF (congestive heart failure)   . Lump or mass in breast     R-Breast    Past Surgical History  Procedure Laterality Date  . Dilation and curettage of uterus  2012    complicated by heart failure  . Abdominal hysterectomy  2012  . Cesarean section    . Upper endoscopy w/ esophageal manometry    . Colonoscopy         The following portions of the patient's history were reviewed and updated as appropriate: Allergies, current medications, and problem list.    Review of Systems:   12 Pt  review of systems  was negative except those addressed in the HPI,     History   Social History  . Marital Status: Married    Spouse Name: N/A    Number of Children: N/A  . Years of Education: N/A   Occupational History  . Not on file.   Social History Main Topics  . Smoking status: Never Smoker   . Smokeless tobacco: Never Used  . Alcohol Use: 0.6 oz/week    1 Glasses of wine per week     Comment: socially  . Drug Use: No  . Sexual Activity: No   Other Topics Concern  . Not on file   Social History Narrative  . No narrative on file    Objective:  Filed Vitals:   11/25/12 1138  BP: 148/80  Pulse: 64  Temp: 97.4 F (36.3 C)  Resp: 12     General appearance: alert, anxious , cooperative and appears stated age Neck: no adenopathy, no carotid bruit, supple, symmetrical, trachea midline and thyroid not enlarged, symmetric, no tenderness/mass/nodules Back: symmetric, no curvature. ROM normal. No CVA tenderness. Lungs: clear to auscultation bilaterally Heart: regular rate and rhythm, S1, S2 normal, no murmur, click, rub or gallop Abdomen: soft, non-tender; bowel sounds normal; no masses,  no organomegaly Psych:  Anxious, makes good eye contact,  Tearful  Assessment and Plan:  Psychogenic dyspepsia Reviewed prior workup  with patient. reassurance provided that no further workup is needed to rule out malignancy or pathology.  Advised to take PPI,and was given 2 wedesk samples of Dexilant. Advised  eat slowly, use Beano/lactase with meals,  Mylanta Gas prn post prandially .  Symptoms may improve with titration of zoloft so advised to discuss increasing zoloft dose with Dr. Maryruth Bun.    A total of 25 minutes was spent with patient more than half of which was spent in counseling, reviewing records from other prviders and coordination of care.  Updated Medication List Outpatient Encounter Prescriptions as of 11/25/2012  Medication Sig  . ALPRAZolam (XANAX) 0.25 MG tablet Take 1 tablet (0.25  mg total) by mouth as needed for sleep or anxiety.  . fexofenadine (ALLEGRA) 180 MG tablet Take 180 mg by mouth as needed.   . mometasone (NASONEX) 50 MCG/ACT nasal spray Place 2 sprays into the nose daily.  . sertraline (ZOLOFT) 50 MG tablet Take 50 mg by mouth daily.  . [DISCONTINUED] citalopram (CELEXA) 10 MG tablet Take 10 mg by mouth daily.  Marland Kitchen amitriptyline (ELAVIL) 25 MG tablet Take 1 tablet (25 mg total) by mouth at bedtime.  . [DISCONTINUED] diclofenac sodium (VOLTAREN) 1 % GEL Apply 2 g topically 4 (four) times daily.  . [DISCONTINUED] gabapentin (NEURONTIN) 100 MG capsule 1 to 3 capsules daily at bedtime as needed for neck pain     No orders of the defined types were placed in this encounter.    No Follow-up on file.

## 2012-11-25 NOTE — Patient Instructions (Signed)
1) Switch your  PPI from Prilosec to Dexilant .  Take it daily in the morning  Take Lactase at breakfast and Beano at lunch and dinner  Chew your food slowly!!  Slow down your eating, take a sip of water, or tea but NO CARBONATED beverages  Take  Bentyl 30 minutes before meals .  If you still have gas and bloating afterward,  Take either Mylanta Gas   Or just Gas X   Go for a 15 minute walk after you eat

## 2012-11-25 NOTE — Progress Notes (Signed)
Pre-visit discussion using our clinic review tool. No additional management support is needed unless otherwise documented below in the visit note.  

## 2012-11-26 ENCOUNTER — Ambulatory Visit: Payer: Self-pay | Admitting: Urology

## 2012-11-27 DIAGNOSIS — F458 Other somatoform disorders: Secondary | ICD-10-CM

## 2012-11-27 HISTORY — DX: Other somatoform disorders: F45.8

## 2012-11-27 NOTE — Assessment & Plan Note (Signed)
Reviewed prior workup with patient. reassurance provided that no further workup is needed to rule out malignancy or pathology.  Advised to take PPI, eat slower, Beano/lactase with meals,  Mylanta Gas prn post prandially .  Symptoms may improve with titration of zoloft so advised to discuss increasing zoloft dose with Dr. Maryruth Bun.

## 2012-11-30 ENCOUNTER — Encounter: Payer: Self-pay | Admitting: Internal Medicine

## 2012-12-24 ENCOUNTER — Ambulatory Visit: Payer: Self-pay

## 2012-12-24 ENCOUNTER — Ambulatory Visit: Payer: Self-pay | Admitting: Physician Assistant

## 2012-12-24 LAB — COMPREHENSIVE METABOLIC PANEL
Albumin: 4.2 g/dL (ref 3.4–5.0)
BUN: 8 mg/dL (ref 7–18)
Bilirubin,Total: 0.3 mg/dL (ref 0.2–1.0)
Glucose: 90 mg/dL (ref 65–99)
Osmolality: 275 (ref 275–301)
Potassium: 3.6 mmol/L (ref 3.5–5.1)
SGOT(AST): 19 U/L (ref 15–37)
SGPT (ALT): 28 U/L (ref 12–78)
Sodium: 139 mmol/L (ref 136–145)

## 2012-12-24 LAB — CBC WITH DIFFERENTIAL/PLATELET
Basophil #: 0.1 10*3/uL (ref 0.0–0.1)
Basophil %: 1.3 %
Lymphocyte #: 2.4 10*3/uL (ref 1.0–3.6)
MCHC: 34 g/dL (ref 32.0–36.0)
MCV: 91 fL (ref 80–100)
Monocyte #: 0.6 x10 3/mm (ref 0.2–0.9)
Monocyte %: 7.7 %
Neutrophil %: 59.2 %
Platelet: 250 10*3/uL (ref 150–440)
WBC: 7.7 10*3/uL (ref 3.6–11.0)

## 2012-12-24 LAB — AMYLASE: Amylase: 51 U/L (ref 25–115)

## 2012-12-31 ENCOUNTER — Encounter: Payer: Self-pay | Admitting: Internal Medicine

## 2013-01-01 MED ORDER — HYDROCHLOROTHIAZIDE 25 MG PO TABS: 25.0000 mg | ORAL_TABLET | Freq: Every day | ORAL | Status: DC

## 2013-01-04 ENCOUNTER — Ambulatory Visit (INDEPENDENT_AMBULATORY_CARE_PROVIDER_SITE_OTHER): Payer: 59 | Admitting: General Surgery

## 2013-01-04 ENCOUNTER — Encounter: Payer: Self-pay | Admitting: General Surgery

## 2013-01-04 ENCOUNTER — Encounter: Payer: Self-pay | Admitting: *Deleted

## 2013-01-04 VITALS — BP 124/70 | HR 70 | Resp 12 | Ht 62.0 in | Wt 120.0 lb

## 2013-01-04 DIAGNOSIS — R21 Rash and other nonspecific skin eruption: Secondary | ICD-10-CM

## 2013-01-04 NOTE — Patient Instructions (Addendum)
Patient to try antifungal cream. To call if no better in 1-2 weeks.

## 2013-01-04 NOTE — Progress Notes (Signed)
Patient ID: Connie Francis, female   DOB: 09/28/1970, 42 y.o.   MRN: [id number]  Chief Complaint  Patient presents with  . Other    rash    HPI Connie Francis is a 42 y.o. female here today for a evaluation of bilaterall breast pain . Patient states she saw some redness under both nipple on 01/01/13. She states her breast are always sore.  HPI  Past Medical History  Diagnosis Date  . Obsessive compulsive disorder   . Breast cyst     left breast, follow up with Connie Francis  . Kidney stones   . Thyroid disease     hyper  . CHF (congestive heart failure)   . Lump or mass in breast     R-Breast    Past Surgical History  Procedure Laterality Date  . Dilation and curettage of uterus  2012    complicated by heart failure  . Abdominal hysterectomy  2012  . Cesarean section    . Upper endoscopy w/ esophageal manometry    . Colonoscopy      Family History  Problem Relation Age of Onset  . Cancer Maternal Aunt     parotid Ca  . Stroke Paternal Aunt   . Cancer Paternal Uncle     lung, nasopharnygeal  . Hearing loss Maternal Grandmother   . Alcohol abuse Maternal Grandmother   . Arthritis Maternal Grandfather     Social History History  Substance Use Topics  . Smoking status: Never Smoker   . Smokeless tobacco: Never Used  . Alcohol Use: 0.6 oz/week    1 Glasses of wine per week     Comment: socially    Allergies  Allergen Reactions  . Latex     Swelling and itching   . Penicillins     swelling  . Promethazine   . Shellfish Allergy     hives  . Sulfa Antibiotics     Rash and itching     Current Outpatient Prescriptions  Medication Sig Dispense Refill  . ALPRAZolam (XANAX) 0.25 MG tablet Take 1 tablet (0.25 mg total) by mouth as needed for sleep or anxiety.  30 tablet  3  . amitriptyline (ELAVIL) 25 MG tablet Take 1 tablet (25 mg total) by mouth at bedtime.  30 tablet  3  . fexofenadine (ALLEGRA) 180 MG tablet Take 180 mg by mouth as needed.       . hydrochlorothiazide  (HYDRODIURIL) 25 MG tablet Take 1 tablet (25 mg total) by mouth daily.  90 tablet  3  . mometasone (NASONEX) 50 MCG/ACT nasal spray Place 2 sprays into the nose daily.      . sertraline (ZOLOFT) 50 MG tablet Take 50 mg by mouth daily.       No current facility-administered medications for this visit.    Review of Systems Review of Systems  Constitutional: Negative.   Respiratory: Negative.   Cardiovascular: Negative.     Blood pressure 124/70, pulse 70, resp. rate 12, height 5\' 2"  (1.575 m), weight 120 lb (54.432 kg).  Physical Exam Physical Exam  Constitutional: She is oriented to person, place, and time. She appears well-developed and well-nourished.  Pulmonary/Chest: Right breast exhibits skin change. Right breast exhibits no inverted nipple, no mass and no nipple discharge. Left breast exhibits skin change. Left breast exhibits no inverted nipple, no mass and no nipple discharge.  Mild rash in  both areolar skin. No palpble mass. No nipple discharge.  Lymphadenopathy:    She has  no cervical adenopathy.    She has no axillary adenopathy.  Neurological: She is alert and oriented to person, place, and time.  Skin: Skin is warm and dry.    Data Reviewed none  Assessment    Dermatitis in areolar skin-no underlying pathology noted.    Plan   Patient to use a Antifungal cream. If it fails to resolve in 1-2 weeks will reassess.        Connie Francis,SEEPLAPUTHUR G 01/05/2013, 5:05 AM

## 2013-01-05 ENCOUNTER — Encounter: Payer: Self-pay | Admitting: General Surgery

## 2013-01-13 ENCOUNTER — Ambulatory Visit: Payer: Self-pay | Admitting: Internal Medicine

## 2013-01-17 ENCOUNTER — Ambulatory Visit: Payer: Self-pay | Admitting: Physician Assistant

## 2013-01-18 ENCOUNTER — Ambulatory Visit (INDEPENDENT_AMBULATORY_CARE_PROVIDER_SITE_OTHER): Payer: 59 | Admitting: Cardiovascular Disease

## 2013-01-18 ENCOUNTER — Encounter: Payer: Self-pay | Admitting: Cardiovascular Disease

## 2013-01-18 VITALS — BP 155/92 | HR 65 | Ht 62.5 in | Wt 119.8 lb

## 2013-01-18 DIAGNOSIS — R609 Edema, unspecified: Secondary | ICD-10-CM | POA: Insufficient documentation

## 2013-01-18 DIAGNOSIS — F418 Other specified anxiety disorders: Secondary | ICD-10-CM

## 2013-01-18 DIAGNOSIS — I10 Essential (primary) hypertension: Secondary | ICD-10-CM

## 2013-01-18 DIAGNOSIS — R0602 Shortness of breath: Secondary | ICD-10-CM

## 2013-01-18 DIAGNOSIS — IMO0001 Reserved for inherently not codable concepts without codable children: Secondary | ICD-10-CM

## 2013-01-18 DIAGNOSIS — F341 Dysthymic disorder: Secondary | ICD-10-CM

## 2013-01-18 DIAGNOSIS — R030 Elevated blood-pressure reading, without diagnosis of hypertension: Secondary | ICD-10-CM

## 2013-01-18 MED ORDER — HYDROCHLOROTHIAZIDE 25 MG PO TABS: 12.5000 mg | ORAL_TABLET | Freq: Every day | ORAL | Status: DC

## 2013-01-18 NOTE — Assessment & Plan Note (Signed)
The swelling is mostly in the toes. There is no ankle edema. The exact etiology is not entirely clear but seems to have improved with hydrochlorothiazide. I will check basic metabolic profile and liver profile to ensure no hypoalbuminemia or kidney disease. Cardiac exam is completely normal and there is no evidence of heart failure.

## 2013-01-18 NOTE — Progress Notes (Signed)
HPI  This is a 43 -year-old pleasant female who is here today for a followup visit regarding lower extremity edema and elevated blood pressure. She was seen last year for palpitations due to mild PVCs and PACs in the setting of significant anxiety. She had an echocardiogram in August of 2013 which showed normal LV systolic function with no significant valvular abnormalities.  Ambulatory blood pressure monitor showed frequent elevation of blood pressure above 140/90 mostly during the day. The blood pressure went back quickly to normal. Most of her symptoms were attributed to anxiety. She was started on small dose Toprol for symptomatic relief which initially improved her symptoms but subsequently stopped due to fatigue.  She continues to suffer from significant anxiety and depression. She recently had a bad sinusitis and was treated with 3 day course of prednisone. Around the same time, she noticed swelling in her toes. Blood pressure was also elevated. Thus she was given hydrochlorothiazide 25 mg once daily by Dr. Derrel Nip. She took this for one week and then stopped it as she felt that she was getting dehydrated. She reports the swelling is better but still noticeable. Blood pressure has been trending up. Home blood pressure readings seem to be within acceptable range. She reports no further palpitations. No chest pain or dyspnea.   Allergies  Allergen Reactions  . Latex     Swelling and itching   . Penicillins     swelling  . Promethazine   . Shellfish Allergy     hives  . Sulfa Antibiotics     Rash and itching      Current Outpatient Prescriptions on File Prior to Visit  Medication Sig Dispense Refill  . ALPRAZolam (XANAX) 0.25 MG tablet Take 1 tablet (0.25 mg total) by mouth as needed for sleep or anxiety.  30 tablet  3  . fexofenadine (ALLEGRA) 180 MG tablet Take 180 mg by mouth as needed.       . mometasone (NASONEX) 50 MCG/ACT nasal spray Place 2 sprays into the nose daily.         No current facility-administered medications on file prior to visit.     Past Medical History  Diagnosis Date  . Obsessive compulsive disorder   . Breast cyst     left breast, follow up with Sankar  . Kidney stones   . Thyroid disease     hyper  . CHF (congestive heart failure)   . Lump or mass in breast     R-Breast     Past Surgical History  Procedure Laterality Date  . Dilation and curettage of uterus  5573    complicated by heart failure  . Abdominal hysterectomy  2012  . Cesarean section    . Upper endoscopy w/ esophageal manometry    . Colonoscopy       Family History  Problem Relation Age of Onset  . Cancer Maternal Aunt     parotid Ca  . Stroke Paternal Aunt   . Cancer Paternal Uncle     lung, nasopharnygeal  . Hearing loss Maternal Grandmother   . Alcohol abuse Maternal Grandmother   . Arthritis Maternal Grandfather      History   Social History  . Marital Status: Married    Spouse Name: N/A    Number of Children: N/A  . Years of Education: N/A   Occupational History  . Not on file.   Social History Main Topics  . Smoking status: Never Smoker   .  Smokeless tobacco: Never Used  . Alcohol Use: 0.6 oz/week    1 Glasses of wine per week     Comment: socially  . Drug Use: No  . Sexual Activity: No   Other Topics Concern  . Not on file   Social History Narrative  . No narrative on file      PHYSICAL EXAM   BP 155/92  Pulse 65  Ht 5' 2.5" (1.588 m)  Wt 119 lb 12 oz (54.318 kg)  BMI 21.54 kg/m2 Constitutional: She is oriented to person, place, and time. She appears well-developed and well-nourished. No distress.  HENT: No nasal discharge.  Head: Normocephalic and atraumatic.  Eyes: Pupils are equal and round. Right eye exhibits no discharge. Left eye exhibits no discharge.  Neck: Normal range of motion. Neck supple. No JVD present. No thyromegaly present.  Cardiovascular: Normal rate, regular rhythm, normal heart sounds. Exam  reveals no gallop and no friction rub. No murmur heard.  Pulmonary/Chest: Effort normal and breath sounds normal. No stridor. No respiratory distress. She has no wheezes. She has no rales. She exhibits no tenderness.  Abdominal: Soft. Bowel sounds are normal. She exhibits no distension. There is no tenderness. There is no rebound and no guarding.  Musculoskeletal: Normal range of motion. She exhibits no edema and no tenderness.  Neurological: She is alert and oriented to person, place, and time. Coordination normal.  Skin: Skin is warm and dry. No rash noted. She is not diaphoretic. No erythema. No pallor.  Psychiatric: She has a normal mood and affect. Her behavior is normal. Judgment and thought content normal.    EKG: Normal sinus rhythm with no significant ST or T wave changes.  ASSESSMENT AND PLAN

## 2013-01-18 NOTE — Patient Instructions (Signed)
Your physician wants you to follow-up in: 6 months You will receive a reminder letter in the mail two months in advance. If you don't receive a letter, please call our office to schedule the follow-up appointment.  Your physician has recommended you make the following change in your medication:  Decrease HCTZ to 12.5 mg daily    Your physician recommends that you have the following lab work today:  Basic metabolic panel  Liver profile

## 2013-01-18 NOTE — Assessment & Plan Note (Signed)
She gets frequently high pressure readings mostly in the setting of anxiety. I recommend resuming hydrochlorothiazide at a lower dose of 12.5 mg once daily.

## 2013-01-18 NOTE — Assessment & Plan Note (Signed)
This continues to be an issue and currently she follows up with Dr. Nicolasa Ducking.

## 2013-01-19 LAB — BASIC METABOLIC PANEL
BUN/Creatinine Ratio: 14 (ref 9–23)
BUN: 9 mg/dL (ref 6–24)
CALCIUM: 9.5 mg/dL (ref 8.7–10.2)
CO2: 22 mmol/L (ref 18–29)
CREATININE: 0.66 mg/dL (ref 0.57–1.00)
Chloride: 98 mmol/L (ref 97–108)
GFR calc Af Amer: 128 mL/min/{1.73_m2} (ref >59)
GFR, EST NON AFRICAN AMERICAN: 111 mL/min/{1.73_m2} (ref >59)
GLUCOSE: 71 mg/dL (ref 65–99)
Potassium: 4.6 mmol/L (ref 3.5–5.2)
Sodium: 140 mmol/L (ref 134–144)

## 2013-01-19 LAB — HEPATIC FUNCTION PANEL
ALBUMIN: 4.6 g/dL (ref 3.5–5.5)
ALT: 13 IU/L (ref 0–32)
AST: 16 IU/L (ref 0–40)
Alkaline Phosphatase: 62 IU/L (ref 39–117)
BILIRUBIN DIRECT: 0.11 mg/dL (ref 0.00–0.40)
BILIRUBIN TOTAL: 0.4 mg/dL (ref 0.0–1.2)
TOTAL PROTEIN: 7.1 g/dL (ref 6.0–8.5)

## 2013-02-11 ENCOUNTER — Ambulatory Visit (INDEPENDENT_AMBULATORY_CARE_PROVIDER_SITE_OTHER): Payer: 59 | Admitting: Internal Medicine

## 2013-02-11 ENCOUNTER — Encounter: Payer: Self-pay | Admitting: Internal Medicine

## 2013-02-11 VITALS — BP 118/76 | HR 78 | Temp 98.5°F | Resp 12 | Wt 119.0 lb

## 2013-02-11 DIAGNOSIS — J209 Acute bronchitis, unspecified: Secondary | ICD-10-CM

## 2013-02-11 MED ORDER — BECLOMETHASONE DIPROPIONATE 80 MCG/ACT NA AERS: 1.0000 | INHALATION_SPRAY | Freq: Every day | NASAL | Status: DC

## 2013-02-11 MED ORDER — BENZONATATE 200 MG PO CAPS: 200.0000 mg | ORAL_CAPSULE | Freq: Three times a day (TID) | ORAL | Status: DC | PRN

## 2013-02-11 MED ORDER — PREDNISONE (PAK) 10 MG PO TABS: ORAL_TABLET | ORAL | Status: DC

## 2013-02-11 NOTE — Progress Notes (Signed)
Pre visit review using our clinic review tool, if applicable. No additional management support is needed unless otherwise documented below in the visit note. 

## 2013-02-11 NOTE — Patient Instructions (Addendum)
You had a viral  Syndrome and the cough is multifactorial due to post nasal drip and irritated airways.  Lavage your sinuses twice daly with Simply saline nasal spray or Neil's sinus rinse throughout the winter to prevent infection .  Use benadryl 25 mg every 8 hours as tolerated for drip and Q nasal spray once daily   I am calling in tessalon perles to take 3 times daily  To suppress  the cough, along with a prednisone taper for the inflammation .  If the throat is no better  In 3 to 4 days OR  if you develop T > 100.4,  Green nasal discharge,  Or facial pain,  Call for an antibiotic.

## 2013-02-11 NOTE — Progress Notes (Signed)
Patient ID: Connie Francis, female   DOB: 1970-07-29, 43 y.o.   MRN: [id number]   Patient Active Problem List   Diagnosis Date Noted  . Acute bronchitis 02/13/2013  . Edema 01/18/2013  . Elevated blood pressure 01/18/2013  . Psychogenic dyspepsia 11/27/2012  . Dizziness and giddiness 10/27/2012  . Cervicalgia 08/14/2012  . Routine general medical examination at a health care facility 06/18/2012  . Diffuse cystic mastopathy 06/18/2012  . Depression with anxiety 01/07/2012  . Irritable bowel syndrome with constipation 12/06/2011  . Breast cyst   . Obsessive compulsive disorder   . Postpartum cardiomyopathy 09/13/2010    Subjective:  CC:   Chief Complaint  Patient presents with  . Cough    lingering cough since beginning of January, productive cough. Treated at Urgent Care right after Christmas with prednisone and z pack    HPI:   Connie Cainis a 43 y.o. female who presents with  Persistent cough.  Was treated by Urgent care with a  Z Pak and prednisone taper in early January for bronchitis.  No chest x ray done.  Cough has lingered,  Notices that she is  coughing more after exercising.  No fevers, significant facial pain or ear pain, although she does note some facial pressure. No purulent sputum. No history of wheezing    Past Medical History  Diagnosis Date  . Obsessive compulsive disorder   . Breast cyst     left breast, follow up with Sankar  . Kidney stones   . Thyroid disease     hyper  . CHF (congestive heart failure)   . Lump or mass in breast     R-Breast  . Anxiety     Past Surgical History  Procedure Laterality Date  . Dilation and curettage of uterus  8546    complicated by heart failure  . Abdominal hysterectomy  2012  . Cesarean section    . Upper endoscopy w/ esophageal manometry    . Colonoscopy         The following portions of the patient's history were reviewed and updated as appropriate: Allergies, current medications, and problem  list.    Review of Systems:   12 Pt  review of systems was negative except those addressed in the HPI,     History   Social History  . Marital Status: Married    Spouse Name: N/A    Number of Children: N/A  . Years of Education: N/A   Occupational History  . Not on file.   Social History Main Topics  . Smoking status: Never Smoker   . Smokeless tobacco: Never Used  . Alcohol Use: 0.6 oz/week    1 Glasses of wine per week     Comment: socially  . Drug Use: No  . Sexual Activity: No   Other Topics Concern  . Not on file   Social History Narrative  . No narrative on file    Objective:  Filed Vitals:   02/11/13 0823  BP: 118/76  Pulse: 78  Temp: 98.5 F (36.9 C)  Resp: 12     General appearance: alert, cooperative and appears stated age Face: no maxillary or frontal sinus tenderness, swelling  or erythema Ears: normal TM's and external ear canals both ears Throat: lips, mucosa, tonsils and tongue normal Neck: no adenopathy, no carotid bruit, supple, symmetrical, trachea midline and thyroid not enlarged, symmetric, no tenderness/mass/nodules Back: symmetric, no curvature. ROM normal. No CVA tenderness. Lungs: clear to auscultation bilaterally NO  WHeezing  Heart: regular rate and rhythm, S1, S2 normal, no murmur, click, rub or gallop Abdomen: soft, non-tender; bowel sounds normal; no masses,  no organomegaly Pulses: 2+ and symmetric Skin: Skin color, texture, turgor normal. No rashes or lesions Lymph nodes: Cervical, supraclavicular, and axillary nodes normal.  Assessment and Plan:  Acute bronchitis Source is most likely viral given the failure of mild HEENT  symptoms to resolve after a z pack.  I have explained that in viral URIS, an antibiotic will not help the symptoms and will increase the risk of developing diarrhea.,  Adding Q nasal, tessalon 100 mg every 8 hours prn cough  And prednisone taper .  Advised not to seek second round of abx unless  symptoms worsen to include fevers, facial or ear pain, or purulent sputum./drainage.    Updated Medication List Outpatient Encounter Prescriptions as of 02/11/2013  Medication Sig  . ALPRAZolam (XANAX) 0.25 MG tablet Take 1 tablet (0.25 mg total) by mouth as needed for sleep or anxiety.  . fexofenadine (ALLEGRA) 180 MG tablet Take 180 mg by mouth as needed.   . hydrochlorothiazide (HYDRODIURIL) 25 MG tablet Take 0.5 tablets (12.5 mg total) by mouth daily.  . mometasone (NASONEX) 50 MCG/ACT nasal spray Place 2 sprays into the nose daily.  . nortriptyline (PAMELOR) 10 MG capsule Take 10-20 mg by mouth at bedtime.  . sertraline (ZOLOFT) 25 MG tablet Take 25 mg by mouth daily.  . Beclomethasone Dipropionate (QNASL) 80 MCG/ACT AERS Place 1 puff into the nose daily.  . benzonatate (TESSALON) 200 MG capsule Take 1 capsule (200 mg total) by mouth 3 (three) times daily as needed for cough.  . predniSONE (STERAPRED UNI-PAK) 10 MG tablet 6 tablets on Day 1 , then reduce by 1 tablet daily until gone  . [DISCONTINUED] DULoxetine (CYMBALTA) 60 MG capsule Take 60 mg by mouth daily.     No orders of the defined types were placed in this encounter.    No Follow-up on file.

## 2013-02-13 ENCOUNTER — Encounter: Payer: Self-pay | Admitting: Internal Medicine

## 2013-02-13 DIAGNOSIS — J209 Acute bronchitis, unspecified: Secondary | ICD-10-CM | POA: Insufficient documentation

## 2013-02-13 NOTE — Assessment & Plan Note (Signed)
Source is most likely viral given the failure of mild HEENT  symptoms to resolve after a z pack.  I have explained that in viral URIS, an antibiotic will not help the symptoms and will increase the risk of developing diarrhea.,  Adding Q nasal, tessalon 100 mg every 8 hours prn cough  And prednisone taper .  Advised not to seek second round of abx unless symptoms worsen to include fevers, facial or ear pain, or purulent sputum./drainage.

## 2013-02-15 ENCOUNTER — Emergency Department (HOSPITAL_COMMUNITY)
Admission: EM | Admit: 2013-02-15 | Discharge: 2013-02-15 | Disposition: A | Discharge status: Home / Self Care | Payer: 59 | Attending: Emergency Medicine | Admitting: Emergency Medicine

## 2013-02-15 ENCOUNTER — Encounter (HOSPITAL_COMMUNITY): Payer: Self-pay | Admitting: Emergency Medicine

## 2013-02-15 ENCOUNTER — Emergency Department (HOSPITAL_COMMUNITY): Payer: 59

## 2013-02-15 DIAGNOSIS — Z79899 Other long term (current) drug therapy: Secondary | ICD-10-CM | POA: Insufficient documentation

## 2013-02-15 DIAGNOSIS — Z91040 Latex allergy status: Secondary | ICD-10-CM | POA: Insufficient documentation

## 2013-02-15 DIAGNOSIS — Z87442 Personal history of urinary calculi: Secondary | ICD-10-CM | POA: Insufficient documentation

## 2013-02-15 DIAGNOSIS — Z8639 Personal history of other endocrine, nutritional and metabolic disease: Secondary | ICD-10-CM | POA: Insufficient documentation

## 2013-02-15 DIAGNOSIS — Z8742 Personal history of other diseases of the female genital tract: Secondary | ICD-10-CM | POA: Insufficient documentation

## 2013-02-15 DIAGNOSIS — IMO0002 Reserved for concepts with insufficient information to code with codable children: Secondary | ICD-10-CM | POA: Insufficient documentation

## 2013-02-15 DIAGNOSIS — Z880 Allergy status to penicillin: Secondary | ICD-10-CM | POA: Insufficient documentation

## 2013-02-15 DIAGNOSIS — Z862 Personal history of diseases of the blood and blood-forming organs and certain disorders involving the immune mechanism: Secondary | ICD-10-CM | POA: Insufficient documentation

## 2013-02-15 DIAGNOSIS — I509 Heart failure, unspecified: Secondary | ICD-10-CM | POA: Insufficient documentation

## 2013-02-15 DIAGNOSIS — J9801 Acute bronchospasm: Secondary | ICD-10-CM | POA: Insufficient documentation

## 2013-02-15 DIAGNOSIS — F411 Generalized anxiety disorder: Secondary | ICD-10-CM | POA: Insufficient documentation

## 2013-02-15 LAB — BASIC METABOLIC PANEL
BUN: 10 mg/dL (ref 6–23)
CALCIUM: 9.4 mg/dL (ref 8.4–10.5)
CO2: 26 mEq/L (ref 19–32)
Chloride: 100 mEq/L (ref 96–112)
Creatinine, Ser: 0.64 mg/dL (ref 0.50–1.10)
GFR calc Af Amer: >90 mL/min (ref >90)
Glucose, Bld: 111 mg/dL — ABNORMAL HIGH (ref 70–99)
Potassium: 4.1 mEq/L (ref 3.7–5.3)
SODIUM: 142 meq/L (ref 137–147)

## 2013-02-15 LAB — CBC
HCT: 38.4 % (ref 36.0–46.0)
Hemoglobin: 13.4 g/dL (ref 12.0–15.0)
MCH: 31.4 pg (ref 26.0–34.0)
MCHC: 34.9 g/dL (ref 30.0–36.0)
MCV: 89.9 fL (ref 78.0–100.0)
PLATELETS: 320 10*3/uL (ref 150–400)
RBC: 4.27 MIL/uL (ref 3.87–5.11)
RDW: 12.5 % (ref 11.5–15.5)
WBC: 7.8 10*3/uL (ref 4.0–10.5)

## 2013-02-15 LAB — POCT I-STAT TROPONIN I: Troponin i, poc: 0 ng/mL (ref 0.00–0.08)

## 2013-02-15 LAB — PRO B NATRIURETIC PEPTIDE: PRO B NATRI PEPTIDE: 134.8 pg/mL — AB (ref 0–125)

## 2013-02-15 LAB — D-DIMER, QUANTITATIVE: D-Dimer, Quant: 0.28 ug/mL-FEU (ref 0.00–0.48)

## 2013-02-15 MED ORDER — ALBUTEROL SULFATE HFA 108 (90 BASE) MCG/ACT IN AERS
2.0000 | INHALATION_SPRAY | RESPIRATORY_TRACT | Status: DC
  Administered 2013-02-15: 2 via RESPIRATORY_TRACT
  Filled 2013-02-15: qty 6.7

## 2013-02-15 NOTE — ED Provider Notes (Signed)
CSN: [id number]     Arrival date & time 02/15/13  1853 History   First MD Initiated Contact with Patient 02/15/13 2043     Chief Complaint  Patient presents with  . Shortness of Breath   (Consider location/radiation/quality/duration/timing/severity/associated sxs/prior Treatment) Patient is a 43 y.o. female presenting with shortness of breath. The history is provided by the patient.  Shortness of Breath  patient here complaining of cough and shortness of breath x1 month. Has been seen by her physician as well as a ENT physician without definitive diagnosis. Was thought to have a URI and also placed on a Z-Pak and is currently on the prednisone taper. She notes dyspnea on exertion without anginal type chest pain. No lower extremity edema. No orthopnea. No syncope or near-syncope. No current sore throat ear pain. She does note some difficulty swallowing at times and this is the worse at night.  Past Medical History  Diagnosis Date  . Obsessive compulsive disorder   . Breast cyst     left breast, follow up with [facility]  . Kidney stones   . Thyroid disease     hyper  . CHF (congestive heart failure)   . Lump or mass in breast     R-Breast  . Anxiety    Past Surgical History  Procedure Laterality Date  . Dilation and curettage of uterus  3536    complicated by heart failure  . Abdominal hysterectomy  2012  . Cesarean section    . Upper endoscopy w/ esophageal manometry    . Colonoscopy     Family History  Problem Relation Age of Onset  . Cancer Maternal Aunt     parotid Ca  . Stroke Paternal Aunt   . Cancer Paternal Uncle     lung, nasopharnygeal  . Hearing loss Maternal Grandmother   . Alcohol abuse Maternal Grandmother   . Arthritis Maternal Grandfather    History  Substance Use Topics  . Smoking status: Never Smoker   . Smokeless tobacco: Never Used  . Alcohol Use: 0.6 oz/week    1 Glasses of wine per week     Comment: socially   OB History   Grav Para Term Preterm  Abortions TAB SAB Ect Mult Living   3    1  1   2      Obstetric Comments   Menstrual: 16 First pregnancy at age 89     Review of Systems  Respiratory: Positive for shortness of breath.   All other systems reviewed and are negative.    Allergies  Latex; Penicillins; Promethazine; Shellfish allergy; and Sulfa antibiotics  Home Medications   Current Outpatient Rx  Name  Route  Sig  Dispense  Refill  . ALPRAZolam (XANAX) 0.25 MG tablet   Oral   Take 0.25 mg by mouth 2 (two) times daily as needed for anxiety.         . Beclomethasone Dipropionate (QNASL) 80 MCG/ACT AERS   Each Nare   Place 1 spray into both nostrils daily.         . pantoprazole (PROTONIX) 40 MG tablet   Oral   Take 40 mg by mouth daily.         . predniSONE (DELTASONE) 10 MG tablet   Oral   Take 10-60 mg by mouth daily with breakfast. *6 day taper pak, 60mg  on day 1, then decrease by 10mg  every day         . mometasone (NASONEX) 50 MCG/ACT nasal spray  Nasal   Place 2 sprays into the nose daily.          BP 152/89  Pulse 60  Temp(Src) 98.4 F (36.9 C) (Oral)  Resp 18  Wt 119 lb 4 oz (54.091 kg)  SpO2 100% Physical Exam  Nursing note and vitals reviewed. Constitutional: She is oriented to person, place, and time. She appears well-developed and well-nourished.  Non-toxic appearance. No distress.  HENT:  Head: Normocephalic and atraumatic.  Eyes: Conjunctivae, EOM and lids are normal. Pupils are equal, round, and reactive to light.  Neck: Normal range of motion. Neck supple. No tracheal deviation present. No mass present.  Cardiovascular: Normal rate, regular rhythm and normal heart sounds.  Exam reveals no gallop.   No murmur heard. Pulmonary/Chest: Effort normal and breath sounds normal. No stridor. No respiratory distress. She has no decreased breath sounds. She has no wheezes. She has no rhonchi. She has no rales.  Abdominal: Soft. Normal appearance and bowel sounds are normal. She  exhibits no distension. There is no tenderness. There is no rebound and no CVA tenderness.  Musculoskeletal: Normal range of motion. She exhibits no edema and no tenderness.  Neurological: She is alert and oriented to person, place, and time. She has normal strength. No cranial nerve deficit or sensory deficit. GCS eye subscore is 4. GCS verbal subscore is 5. GCS motor subscore is 6.  Skin: Skin is warm and dry. No abrasion and no rash noted.  Psychiatric: She has a normal mood and affect. Her speech is normal and behavior is normal.    ED Course  Procedures (including critical care time) Labs Review Labs Reviewed  BASIC METABOLIC PANEL - Abnormal; Notable for the following:    Glucose, Bld 111 (*)    All other components within normal limits  PRO B NATRIURETIC PEPTIDE - Abnormal; Notable for the following:    Pro B Natriuretic peptide (BNP) 134.8 (*)    All other components within normal limits  CBC  D-DIMER, QUANTITATIVE  POCT I-STAT TROPONIN I   Imaging Review Dg Chest 2 View  02/15/2013   CLINICAL DATA:  Shortness of breath.  EXAM: CHEST  2 VIEW  COMPARISON:  PA and lateral chest 08/13/2011.  FINDINGS: Heart size and mediastinal contours are within normal limits. Both lungs are clear. Visualized skeletal structures are unremarkable.  IMPRESSION: Negative exam.   Electronically Signed   By: Inge Rise M.D.   On: 02/15/2013 20:29    EKG Interpretation    Date/Time:  Monday February 15 2013 18:57:28 EST Ventricular Rate:  60 PR Interval:  120 QRS Duration: 78 QT Interval:  388 QTC Calculation: 388 R Axis:   72 Text Interpretation:  Normal sinus rhythm Normal ECG Confirmed by Alyssha Housh  MD, Apolinar Bero ([id number]) on 02/15/2013 8:45:07 PM            MDM  No diagnosis found.   Patient with negative d-dimer here and low risk for pulmonary embolism. Cardiac workup as well as lab work reassuring. We'll prescribe albuterol HFA for possible bronchospasm and she will see her  doctor.  Leota Jacobsen, MD 02/15/13 [phone]

## 2013-02-15 NOTE — ED Notes (Signed)
Pt has been sick since [employer].  Pt reports cough, sob, and had episodes with swallowing.  Pt gets sob on exertion, with long conversations, and reports hurts to breath.  Pt states that she is on a prednisone taper.  Pt has history of chf twice

## 2013-02-15 NOTE — Discharge Instructions (Signed)
Bronchospasm, Adult A bronchospasm is a spasm or tightening of the airways going into the lungs. During a bronchospasm breathing becomes more difficult because the airways get smaller. When this happens there can be coughing, a whistling sound when breathing (wheezing), and difficulty breathing. Bronchospasm is often associated with asthma, but not all patients who experience a bronchospasm have asthma. CAUSES  A bronchospasm is caused by inflammation or irritation of the airways. The inflammation or irritation may be triggered by:   Allergies (such as to animals, pollen, food, or mold). Allergens that cause bronchospasm may cause wheezing immediately after exposure or many hours later.   Infection. Viral infections are believed to be the most common cause of bronchospasm.   Exercise.   Irritants (such as pollution, cigarette smoke, strong odors, aerosol sprays, and paint fumes).   Weather changes. Winds increase molds and pollens in the air. Rain refreshes the air by washing irritants out. Cold air may cause inflammation.   Stress and emotional upset.  SIGNS AND SYMPTOMS   Wheezing.   Excessive nighttime coughing.   Frequent or severe coughing with a simple cold.   Chest tightness.   Shortness of breath.  DIAGNOSIS  Bronchospasm is usually diagnosed through a history and physical exam. Tests, such as chest X-rays, are sometimes done to look for other conditions. TREATMENT   Inhaled medicines can be given to open up your airways and help you breathe. The medicines can be given using either an inhaler or a nebulizer machine.  Corticosteroid medicines may be given for severe bronchospasm, usually when it is associated with asthma. HOME CARE INSTRUCTIONS   Always have a plan prepared for seeking medical care. Know when to call your health care provider and local emergency services (911 in the [place] Know where you can access local emergency care.  Only take medicines as  directed by your health care provider.  If you were prescribed an inhaler or nebulizer machine, ask your health care provider to explain how to use it correctly. Always use a spacer with your inhaler if you were given one.  It is necessary to remain calm during an attack. Try to relax and breathe more slowly.  Control your home environment in the following ways:   Change your heating and air conditioning filter at least once a month.   Limit your use of fireplaces and wood stoves.  Do not smoke and do not allow smoking in your home.   Avoid exposure to perfumes and fragrances.   Get rid of pests (such as roaches and mice) and their droppings.   Throw away plants if you see mold on them.   Keep your house clean and dust free.   Replace carpet with wood, tile, or vinyl flooring. Carpet can trap dander and dust.   Use allergy-proof pillows, mattress covers, and box spring covers.   Wash bed sheets and blankets every week in hot water and dry them in a dryer.   Use blankets that are made of polyester or cotton.   Wash hands frequently. SEEK MEDICAL CARE IF:   You have muscle aches.   You have chest pain.   The sputum changes from clear or white to yellow, green, gray, or bloody.   The sputum you cough up gets thicker.   There are problems that may be related to the medicine you are given, such as a rash, itching, swelling, or trouble breathing.  SEEK IMMEDIATE MEDICAL CARE IF:   You have worsening wheezing and coughing  even after taking your prescribed medicines.   You have increased difficulty breathing.   You develop severe chest pain. MAKE SURE YOU:   Understand these instructions.  Will watch your condition.  Will get help right away if you are not doing well or get worse. Document Released: 01/03/2003 Document Revised: 09/02/2012 Document Reviewed: 06/22/2012 [facility] Patient Information 2014 [place].

## 2013-02-17 ENCOUNTER — Ambulatory Visit: Payer: Self-pay | Admitting: Family Medicine

## 2013-02-19 ENCOUNTER — Ambulatory Visit: Payer: Self-pay | Admitting: Neurology

## 2013-02-23 ENCOUNTER — Emergency Department: Payer: Self-pay | Admitting: Emergency Medicine

## 2013-02-23 ENCOUNTER — Telehealth: Payer: Self-pay | Admitting: *Deleted

## 2013-02-23 NOTE — Telephone Encounter (Signed)
Dr. Nicolasa Ducking office called stated MD needs to speak with primary concerning patient ask for MD to call this number [phone] ask for [facility] and she will get Dr. Nicolasa Ducking from room.

## 2013-02-25 ENCOUNTER — Ambulatory Visit: Payer: Self-pay | Admitting: Psychiatry

## 2013-02-25 LAB — TSH: Thyroid Stimulating Horm: 0.386 u[IU]/mL — ABNORMAL LOW

## 2013-06-12 ENCOUNTER — Ambulatory Visit: Payer: Self-pay | Admitting: Medical

## 2013-06-12 LAB — COMPREHENSIVE METABOLIC PANEL
AST: 15 U/L (ref 15–37)
Albumin: 4.1 g/dL (ref 3.4–5.0)
Alkaline Phosphatase: 69 U/L
Anion Gap: 8 (ref 7–16)
BUN: 7 mg/dL (ref 7–18)
Bilirubin,Total: 0.4 mg/dL (ref 0.2–1.0)
CREATININE: 0.64 mg/dL (ref 0.60–1.30)
Calcium, Total: 9.3 mg/dL (ref 8.5–10.1)
Chloride: 102 mmol/L (ref 98–107)
Co2: 27 mmol/L (ref 21–32)
EGFR (African American): >60
EGFR (Non-African Amer.): >60
Glucose: 78 mg/dL (ref 65–99)
Osmolality: 271 (ref 275–301)
Potassium: 3.9 mmol/L (ref 3.5–5.1)
SGPT (ALT): 18 U/L (ref 12–78)
Sodium: 137 mmol/L (ref 136–145)
TOTAL PROTEIN: 7.8 g/dL (ref 6.4–8.2)

## 2013-06-18 ENCOUNTER — Encounter: Payer: 59 | Admitting: Internal Medicine

## 2013-06-24 ENCOUNTER — Ambulatory Visit: Payer: Self-pay | Admitting: Emergency Medicine

## 2013-06-24 LAB — URINALYSIS, COMPLETE
BILIRUBIN, UR: NEGATIVE
Glucose,UR: NEGATIVE mg/dL (ref 0–75)
Ketone: NEGATIVE
Leukocyte Esterase: NEGATIVE
Nitrite: NEGATIVE
PH: 8 (ref 4.5–8.0)
Protein: NEGATIVE
Specific Gravity: 1.01 (ref 1.003–1.030)

## 2013-06-24 LAB — BASIC METABOLIC PANEL
Anion Gap: 7 (ref 7–16)
BUN: 8 mg/dL (ref 7–18)
Calcium, Total: 9.3 mg/dL (ref 8.5–10.1)
Chloride: 102 mmol/L (ref 98–107)
Co2: 31 mmol/L (ref 21–32)
Creatinine: 0.63 mg/dL (ref 0.60–1.30)
EGFR (African American): >60
EGFR (Non-African Amer.): >60
Glucose: 90 mg/dL (ref 65–99)
Osmolality: 277 (ref 275–301)
Potassium: 3.8 mmol/L (ref 3.5–5.1)
Sodium: 140 mmol/L (ref 136–145)

## 2013-06-24 LAB — CBC WITH DIFFERENTIAL/PLATELET
Basophil #: 0 10*3/uL (ref 0.0–0.1)
Basophil %: 0.5 %
Eosinophil #: 0 10*3/uL (ref 0.0–0.7)
Eosinophil %: 0.2 %
HCT: 37.8 % (ref 35.0–47.0)
HGB: 12.6 g/dL (ref 12.0–16.0)
Lymphocyte #: 1.5 10*3/uL (ref 1.0–3.6)
Lymphocyte %: 29 %
MCH: 30.4 pg (ref 26.0–34.0)
MCHC: 33.4 g/dL (ref 32.0–36.0)
MCV: 91 fL (ref 80–100)
Monocyte #: 0.4 x10 3/mm (ref 0.2–0.9)
Monocyte %: 7.1 %
Neutrophil #: 3.3 10*3/uL (ref 1.4–6.5)
Neutrophil %: 63.2 %
Platelet: 261 10*3/uL (ref 150–440)
RBC: 4.15 10*6/uL (ref 3.80–5.20)
RDW: 12.5 % (ref 11.5–14.5)
WBC: 5.2 10*3/uL (ref 3.6–11.0)

## 2013-07-06 ENCOUNTER — Ambulatory Visit (INDEPENDENT_AMBULATORY_CARE_PROVIDER_SITE_OTHER): Payer: 59 | Admitting: Internal Medicine

## 2013-07-06 ENCOUNTER — Encounter: Payer: Self-pay | Admitting: Internal Medicine

## 2013-07-06 VITALS — BP 122/72 | HR 67 | Temp 98.4°F | Resp 18 | Ht 61.25 in | Wt 115.8 lb

## 2013-07-06 DIAGNOSIS — J069 Acute upper respiratory infection, unspecified: Secondary | ICD-10-CM

## 2013-07-06 DIAGNOSIS — E0590 Thyrotoxicosis, unspecified without thyrotoxic crisis or storm: Secondary | ICD-10-CM

## 2013-07-06 DIAGNOSIS — Z90710 Acquired absence of both cervix and uterus: Secondary | ICD-10-CM

## 2013-07-06 DIAGNOSIS — Z0000 Encounter for general adult medical examination without abnormal findings: Secondary | ICD-10-CM

## 2013-07-06 NOTE — Progress Notes (Signed)
Patient ID: Le[place] JON, female   DOB: 1970/03/25, 43 y.o.   MRN: [id number]    Subjective:     Connie Francis is a 43 y.o. female and is here for a comprehensive physical exam. The patient reports problems - as follows:Marland Kitchen  History   Social History  . Marital Status: Married    Spouse Name: N/A    Number of Children: N/A  . Years of Education: N/A   Occupational History  . Not on file.   Social History Main Topics  . Smoking status: Never Smoker   . Smokeless tobacco: Never Used  . Alcohol Use: 0.6 oz/week    1 Glasses of wine per week     Comment: socially  . Drug Use: No  . Sexual Activity: No   Other Topics Concern  . Not on file   Social History Narrative  . No narrative on file   Health Maintenance  Topic Date Due  . Pap Smear  07/06/2013  . Influenza Vaccine  08/14/2013  . Tetanus/tdap  07/06/2020    The following portions of the patient's history were reviewed and updated as appropriate: allergies, current medications, past family history, past medical history, past social history, past surgical history and problem list.  Review of Systems A comprehensive review of systems was negative.   Objective:    BP 122/72  Pulse 67  Temp(Src) 98.4 F (36.9 C) (Oral)  Resp 18  Ht 5' 1.25" (1.556 m)  Wt 115 lb 12 oz (52.504 kg)  BMI 21.69 kg/m2  SpO2 99% General appearance: alert, cooperative and appears stated age Head: Normocephalic, without obvious abnormality, atraumatic Eyes: conjunctivae/corneas clear. PERRL, EOM's intact. Fundi benign. Ears: normal TM's and external ear canals both ears Nose: Nares normal. Septum midline. Mucosa normal. No drainage or sinus tenderness. Throat: lips, mucosa, and tongue normal; teeth and gums normal Neck: no adenopathy, no carotid bruit, no JVD, supple, symmetrical, trachea midline and thyroid not enlarged, symmetric, no tenderness/mass/nodules Lungs: clear to auscultation bilaterally Breasts: normal appearance, no masses  or tenderness Heart: regular rate and rhythm, S1, S2 normal, no murmur, click, rub or gallop Abdomen: soft, non-tender; bowel sounds normal; no masses,  no organomegaly Extremities: extremities normal, atraumatic, no cyanosis or edema Pulses: 2+ and symmetric Skin: Skin color, texture, turgor normal. No rashes or lesions Neurologic: Alert and oriented X 3, normal strength and tone. Normal symmetric reflexes. Normal coordination and gait.    Assessment and plan:   Routine general medical examination at a health care facility Annual comprehensive exam was done including breast, excluding pelvic and PAP smear. All screenings have been addressed .   Viral URI This URI is most likely viral given the failure of mild HEENT  Symptoms and normal exam     I have explained that in viral URIS, an antibiotic will not help the symptoms and will increase the risk of developing diarrhea.,  Continue oral and nasal decongestants,  Ibuprofen 400 mg and tylenol 650 mq 8 hrs for aches and pains,  And will addtessalon 100 mg every 8 hours prn cough     Updated Medication List Outpatient Encounter Prescriptions as of 07/06/2013  Medication Sig  . ALPRAZolam (XANAX) 0.25 MG tablet Take 0.25 mg by mouth 2 (two) times daily as needed for anxiety.  . Beclomethasone Dipropionate (QNASL) 80 MCG/ACT AERS Place 1 spray into both nostrils daily.  . benzonatate (TESSALON) 200 MG capsule Take 200 mg by mouth 3 (three) times daily as needed  for cough.  . Linaclotide (LINZESS) 145 MCG CAPS capsule Take 145 mcg by mouth daily.  . mometasone (NASONEX) 50 MCG/ACT nasal spray Place 2 sprays into the nose daily.  . pantoprazole (PROTONIX) 40 MG tablet Take 40 mg by mouth daily.  . sertraline (ZOLOFT) 50 MG tablet Take 50 mg by mouth daily.  . [DISCONTINUED] predniSONE (DELTASONE) 10 MG tablet Take 10-60 mg by mouth daily with breakfast. *6 day taper pak, 60mg  on day 1, then decrease by 10mg  every day

## 2013-07-06 NOTE — Progress Notes (Signed)
Pre-visit discussion using our clinic review tool. No additional management support is needed unless otherwise documented below in the visit note.  

## 2013-07-07 ENCOUNTER — Encounter: Payer: Self-pay | Admitting: Internal Medicine

## 2013-07-07 LAB — T4 AND TSH
T4 TOTAL: 7.8 ug/dL (ref 4.5–12.0)
TSH: 0.727 u[IU]/mL (ref 0.450–4.500)

## 2013-07-08 ENCOUNTER — Encounter: Payer: Self-pay | Admitting: Internal Medicine

## 2013-07-08 DIAGNOSIS — J069 Acute upper respiratory infection, unspecified: Secondary | ICD-10-CM | POA: Insufficient documentation

## 2013-07-08 NOTE — Assessment & Plan Note (Signed)
This URI is most likely viral given the failure of mild HEENT  Symptoms and normal exam     I have explained that in viral URIS, an antibiotic will not help the symptoms and will increase the risk of developing diarrhea.,  Continue oral and nasal decongestants,  Ibuprofen 400 mg and tylenol 650 mq 8 hrs for aches and pains,  And will addtessalon 100 mg every 8 hours prn cough

## 2013-07-08 NOTE — Assessment & Plan Note (Signed)
Annual comprehensive exam was done including breast, excluding pelvic and PAP smear. All screenings have been addressed .  

## 2013-07-12 DIAGNOSIS — F428 Other obsessive-compulsive disorder: Secondary | ICD-10-CM | POA: Insufficient documentation

## 2013-07-12 DIAGNOSIS — N6009 Solitary cyst of unspecified breast: Secondary | ICD-10-CM | POA: Insufficient documentation

## 2013-07-13 ENCOUNTER — Ambulatory Visit: Payer: Self-pay | Admitting: Urology

## 2013-07-14 ENCOUNTER — Ambulatory Visit (INDEPENDENT_AMBULATORY_CARE_PROVIDER_SITE_OTHER): Payer: 59 | Admitting: Adult Health

## 2013-07-14 ENCOUNTER — Encounter: Payer: Self-pay | Admitting: Adult Health

## 2013-07-14 VITALS — BP 116/68 | HR 73 | Temp 98.4°F | Resp 14 | Wt 114.5 lb

## 2013-07-14 DIAGNOSIS — R21 Rash and other nonspecific skin eruption: Secondary | ICD-10-CM

## 2013-07-14 MED ORDER — FIRST-DUKES MOUTHWASH MT SUSP: OROMUCOSAL | Status: DC

## 2013-07-14 NOTE — Patient Instructions (Signed)
  Start Duke's Mouthwash every 6 hours. Take 15 mls and swish around your mouth, throat and spit.  Take benadryl every 8 hours as needed.  Do not take the lamictal.  Follow up with Dr. Nicolasa Ducking

## 2013-07-14 NOTE — Progress Notes (Signed)
Subjective:    Patient ID: Connie Francis, female    DOB: [birth year], 43 y.o.   MRN: [id number]  Rash  43 yo female presents today for rash on her neck. Noted a rash on her anterior neck and blisters in her mouth.Also spots noted on bra-line, stomach and back. Recently started on lamictal about 1 week ago. Denies any other changes in food, medications, soaps/detergents or make-up. Describes the blisters in the mouth as uncomfortable. Notes the rash on her neck to be slightly raised. Has not performed any treatments on the rash. Concern for lamictal related rash.  Denies any skin shedding, tongue or lip swelling, or difficulty breathing. Rash has stayed about the same.    Past Medical History  Diagnosis Date  . Obsessive compulsive disorder   . Breast cyst     left breast, follow up with [facility]  . Kidney stones   . Thyroid disease     hyper  . CHF (congestive heart failure)   . Lump or mass in breast     R-Breast  . Anxiety     Current Outpatient Prescriptions on File Prior to Visit  Medication Sig Dispense Refill  . ALPRAZolam (XANAX) 0.25 MG tablet Take 0.25 mg by mouth 2 (two) times daily as needed for anxiety.      . Beclomethasone Dipropionate (QNASL) 80 MCG/ACT AERS Place 1 spray into both nostrils daily.      . benzonatate (TESSALON) 200 MG capsule Take 200 mg by mouth 3 (three) times daily as needed for cough.      . Linaclotide (LINZESS) 145 MCG CAPS capsule Take 145 mcg by mouth daily.      . mometasone (NASONEX) 50 MCG/ACT nasal spray Place 2 sprays into the nose daily.      . pantoprazole (PROTONIX) 40 MG tablet Take 40 mg by mouth daily.      . sertraline (ZOLOFT) 50 MG tablet Take 50 mg by mouth daily.       No current facility-administered medications on file prior to visit.     Review of Systems  Constitutional: Negative.   HENT: Positive for mouth sores (blisters).   Eyes: Negative.   Respiratory: Negative.   Cardiovascular: Negative.   Gastrointestinal:  Negative.   Endocrine: Negative.   Genitourinary: Negative.   Musculoskeletal: Negative.   Skin: Positive for rash (primarily neck, some stomach and back).  Allergic/Immunologic: Negative.   Neurological: Negative.   Hematological: Negative.   Psychiatric/Behavioral: Negative.        Objective:  BP 116/68  Pulse 73  Temp(Src) 98.4 F (36.9 C) (Oral)  Resp 14  Wt 114 lb 8 oz (51.937 kg)  SpO2 98%   Physical Exam  Constitutional: She is oriented to person, place, and time. She appears well-developed and well-nourished. No distress.  HENT:  Multiple aphthous ulcers noted in mouth  Neck: Neck supple.  Cardiovascular: Normal rate, regular rhythm and normal heart sounds.   Pulmonary/Chest: Effort normal and breath sounds normal.  Neurological: She is alert and oriented to person, place, and time.  Skin: Skin is warm and dry. Rash (Macular rash anterior neck, with some spots on stomach and back ) noted.  Psychiatric: She has a normal mood and affect. Her behavior is normal. Judgment and thought content normal.       Assessment & Plan:  1. Rash Macular rash and aphthous sores of unknown source potentially related to lamictal. Stop lamictacl. Start Duke's Mouthwash as described below. Take benadryl every  8 hours as needed. Follow-up with Dr. Nicolasa Ducking regarding lamictal. Follow-up with clinic if symptoms worsen or do not improve.   - Diphenhyd-Hydrocort-Nystatin (FIRST-DUKES MOUTHWASH) SUSP; Swish 15 ml inside the mouth, throat for 30 seconds and spit. Do this every 6 hours  Dispense: 1 Bottle; Refill: 0

## 2013-07-14 NOTE — Progress Notes (Signed)
Pre visit review using our clinic review tool, if applicable. No additional management support is needed unless otherwise documented below in the visit note. 

## 2013-07-16 ENCOUNTER — Ambulatory Visit: Payer: Self-pay | Admitting: Internal Medicine

## 2013-07-16 LAB — RAPID STREP-A WITH REFLX: Micro Text Report: NEGATIVE

## 2013-07-18 ENCOUNTER — Telehealth: Payer: Self-pay | Admitting: Internal Medicine

## 2013-07-19 ENCOUNTER — Ambulatory Visit (INDEPENDENT_AMBULATORY_CARE_PROVIDER_SITE_OTHER): Payer: 59 | Admitting: Cardiovascular Disease

## 2013-07-19 ENCOUNTER — Encounter: Payer: Self-pay | Admitting: Cardiovascular Disease

## 2013-07-19 VITALS — BP 123/76 | HR 66 | Ht 62.0 in | Wt 115.5 lb

## 2013-07-19 DIAGNOSIS — R0602 Shortness of breath: Secondary | ICD-10-CM

## 2013-07-19 DIAGNOSIS — R002 Palpitations: Secondary | ICD-10-CM

## 2013-07-19 LAB — BETA STREP CULTURE(ARMC)

## 2013-07-19 NOTE — Assessment & Plan Note (Signed)
With mild PVCs mostly triggered by anxiety. Symptoms resolved completely. She seems to be doing better overall. She can followup with me as needed.

## 2013-07-19 NOTE — Progress Notes (Signed)
HPI  This is a 43 -year-old pleasant female who is here today for a followup visit regarding palpitations due to mild PVCs and PACs in the setting of significant anxiety. She had an echocardiogram in August of 2013 which showed normal LV systolic function with no significant valvular abnormalities.  Ambulatory blood pressure monitor showed frequent elevation of blood pressure above 140/90 mostly during the day. The blood pressure went back quickly to normal. Most of her symptoms were attributed to anxiety.  She has been doing well overall with no recurrent symptoms. She seems to be coping with anxiety better.   Allergies  Allergen Reactions  . Latex     Swelling and itching   . Penicillins     swelling  . Promethazine   . Shellfish Allergy     hives  . Sulfa Antibiotics     Rash and itching   . Lamictal [Lamotrigine] Rash    Blisters      Current Outpatient Prescriptions on File Prior to Visit  Medication Sig Dispense Refill  . ALPRAZolam (XANAX) 0.25 MG tablet Take 0.25 mg by mouth 2 (two) times daily as needed for anxiety.      . Diphenhyd-Hydrocort-Nystatin (FIRST-DUKES MOUTHWASH) SUSP Swish 15 ml inside the mouth, throat for 30 seconds and spit. Do this every 6 hours  1 Bottle  0  . mometasone (NASONEX) 50 MCG/ACT nasal spray Place 2 sprays into the nose daily.      . pantoprazole (PROTONIX) 40 MG tablet Take 40 mg by mouth daily.      . sertraline (ZOLOFT) 50 MG tablet Take 50 mg by mouth daily.       No current facility-administered medications on file prior to visit.     Past Medical History  Diagnosis Date  . Obsessive compulsive disorder   . Breast cyst     left breast, follow up with S[facility]ar  . Kidney stones   . Thyroid disease     hyper  . CHF (congestive heart failure)   . Lump or mass in breast     R-Breast  . Anxiety      Past Surgical History  Procedure Laterality Date  . Dilation and curettage of uterus  7371    complicated by heart failure    . Abdominal hysterectomy  2012  . Cesarean section    . Upper endoscopy w/ esophageal manometry    . Colonoscopy       Family History  Problem Relation Age of Onset  . Cancer Maternal Aunt     parotid Ca  . Stroke Paternal Aunt   . Cancer Paternal Uncle     lung, nasopharnygeal  . Hearing loss Maternal Grandmother   . Alcohol abuse Maternal Grandmother   . Arthritis Maternal Grandfather      History   Social History  . Marital Status: Married    Spouse Name: N/A    Number of Children: N/A  . Years of Education: N/A   Occupational History  . Not on file.   Social History Main Topics  . Smoking status: Never Smoker   . Smokeless tobacco: Never Used  . Alcohol Use: No     Comment: socially  . Drug Use: No  . Sexual Activity: No   Other Topics Concern  . Not on file   Social History Narrative  . No narrative on file      PHYSICAL EXAM   BP 123/76  Pulse 66  Ht 5'  2" (1.575 m)  Wt 115 lb 8 oz (52.39 kg)  BMI 21.12 kg/m2 Constitutional: She is oriented to person, place, and time. She appears well-developed and well-nourished. No distress.  HENT: No nasal discharge.  Head: Normocephalic and atraumatic.  Eyes: Pupils are equal and round. Right eye exhibits no discharge. Left eye exhibits no discharge.  Neck: Normal range of motion. Neck supple. No JVD present. No thyromegaly present.  Cardiovascular: Normal rate, regular rhythm, normal heart sounds. Exam reveals no gallop and no friction rub. No murmur heard.  Pulmonary/Chest: Effort normal and breath sounds normal. No stridor. No respiratory distress. She has no wheezes. She has no rales. She exhibits no tenderness.  Abdominal: Soft. Bowel sounds are normal. She exhibits no distension. There is no tenderness. There is no rebound and no guarding.  Musculoskeletal: Normal range of motion. She exhibits no edema and no tenderness.  Neurological: She is alert and oriented to person, place, and time. Coordination  normal.  Skin: Skin is warm and dry. No rash noted. She is not diaphoretic. No erythema. No pallor.  Psychiatric: She has a normal mood and affect. Her behavior is normal. Judgment and thought content normal.    EKG: Normal sinus rhythm with no significant ST or T wave changes.  ASSESSMENT AND PLAN

## 2013-07-19 NOTE — Patient Instructions (Signed)
Follow up as needed

## 2013-08-03 ENCOUNTER — Ambulatory Visit: Payer: Self-pay | Admitting: Gastroenterology

## 2013-08-09 ENCOUNTER — Encounter: Payer: Self-pay | Admitting: General Surgery

## 2013-08-09 ENCOUNTER — Telehealth: Payer: Self-pay | Admitting: *Deleted

## 2013-08-09 ENCOUNTER — Ambulatory Visit (INDEPENDENT_AMBULATORY_CARE_PROVIDER_SITE_OTHER): Payer: 59 | Admitting: General Surgery

## 2013-08-09 VITALS — BP 120/76 | HR 74 | Resp 12 | Ht 62.0 in | Wt 118.0 lb

## 2013-08-09 DIAGNOSIS — R1013 Epigastric pain: Secondary | ICD-10-CM

## 2013-08-09 NOTE — Patient Instructions (Signed)
Patient has been scheduled for a HIDA scan at [facility] for 08-18-13 at 9:30 am (arrive 9:15 am). Prep: NPO 6 hours prior including medications, no smoking, no gum, and no narcotic pain medications the morning of scan. Patient verbalizes understanding of instructions.

## 2013-08-09 NOTE — Telephone Encounter (Signed)
Per Vaughan Basta, patient has rescheduled her HIDA scan from 08-18-13 to 08-13-13.

## 2013-08-09 NOTE — Progress Notes (Signed)
Patient ID: Connie Francis, female   DOB: 1970-08-31, 43 y.o.   MRN: [id number]  Chief Complaint  Patient presents with  . Abdominal Pain    HPI Connie Francis is a 43 y.o. female here today for a evaluation of abdominal pain. She states the pain has been going on for about two years off and on. The pain is in her upper right quadrant and last about all day.  She had a abdominal ultrasound done on 07/29/13.  HPI  Past Medical History  Diagnosis Date  . Obsessive compulsive disorder   . Breast cyst     left breast, follow up with Shakela Donati  . Kidney stones   . Thyroid disease     hyper  . CHF (congestive heart failure)   . Lump or mass in breast     R-Breast  . Anxiety   . IBS (irritable bowel syndrome)   . GERD (gastroesophageal reflux disease)     Past Surgical History  Procedure Laterality Date  . Dilation and curettage of uterus  4034    complicated by heart failure  . Abdominal hysterectomy  2012  . Cesarean section    . Upper endoscopy w/ esophageal manometry    . Colonoscopy  2014    Family History  Problem Relation Age of Onset  . Cancer Maternal Aunt     parotid Ca  . Stroke Paternal Aunt   . Cancer Paternal Uncle     lung, nasopharnygeal  . Hearing loss Maternal Grandmother   . Alcohol abuse Maternal Grandmother   . Arthritis Maternal Grandfather     Social History History  Substance Use Topics  . Smoking status: Never Smoker   . Smokeless tobacco: Never Used  . Alcohol Use: No     Comment: socially    Allergies  Allergen Reactions  . Latex     Swelling and itching   . Penicillins     swelling  . Promethazine   . Shellfish Allergy     hives  . Sulfa Antibiotics     Rash and itching   . Lamictal [Lamotrigine] Rash    Blisters     Current Outpatient Prescriptions  Medication Sig Dispense Refill  . ALPRAZolam (XANAX) 0.25 MG tablet Take 0.25 mg by mouth 2 (two) times daily as needed for anxiety.      . Diphenhyd-Hydrocort-Nystatin  (FIRST-DUKES MOUTHWASH) SUSP Swish 15 ml inside the mouth, throat for 30 seconds and spit. Do this every 6 hours  1 Bottle  0  . mometasone (NASONEX) 50 MCG/ACT nasal spray Place 2 sprays into the nose daily.      . pantoprazole (PROTONIX) 40 MG tablet Take 40 mg by mouth daily.      . RABEprazole (ACIPHEX) 20 MG tablet Take 1 tablet by mouth.      . sertraline (ZOLOFT) 50 MG tablet Take 50 mg by mouth daily.      Marland Kitchen tiZANidine (ZANAFLEX) 4 MG tablet Take 4 mg by mouth.       No current facility-administered medications for this visit.    Review of Systems Review of Systems  Constitutional: Negative.   Respiratory: Negative.   Cardiovascular: Negative.   Gastrointestinal: Positive for nausea and abdominal pain. Negative for vomiting, diarrhea, constipation, blood in stool, abdominal distention, anal bleeding and rectal pain.    Blood pressure 120/76, pulse 74, resp. rate 12, height 5\' 2"  (1.575 m), weight 118 lb (53.524 kg).  Physical Exam Physical Exam  Constitutional:  She is oriented to person, place, and time. She appears well-developed and well-nourished.  Eyes: Conjunctivae are normal. No scleral icterus.  Neck: Neck supple.  Cardiovascular: Normal rate, regular rhythm and normal heart sounds.   Pulmonary/Chest: Effort normal and breath sounds normal.  Abdominal: Soft. Bowel sounds are normal. There is no hepatosplenomegaly. There is no tenderness. There is negative Murphy's sign. No hernia.  Neurological: She is alert and oriented to person, place, and time.  Skin: Skin is warm and dry.    Data Reviewed Ultrasound reviewed. No stones seen. gallblader was not adequately distended in spite of being npo overnight.  Assessment    RUQ abd pain. Non distended gallbladder on [place]. May have chronic cholecystitis.    Plan    Recommended HIDA scan- if gallbladder does not visualize, will discuss cholecystectomy.     Patient has been scheduled for a HIDA scan at [facility] for 08-18-13 at 9:30 am (arrive 9:15 am). Prep: NPO 6 hours prior including medications, no smoking, no gum, and no narcotic pain medications the morning of scan. Patient verbalizes understanding of instructions.    Vedh Ptacek G 08/11/2013, 6:06 AM

## 2013-08-11 ENCOUNTER — Encounter: Payer: Self-pay | Admitting: General Surgery

## 2013-08-13 ENCOUNTER — Ambulatory Visit: Payer: Self-pay | Admitting: General Surgery

## 2013-08-16 ENCOUNTER — Encounter: Payer: Self-pay | Admitting: General Surgery

## 2013-08-16 NOTE — Telephone Encounter (Signed)
Pt notified that HIDA scan was essentially normal. At present there is no abnormal imaging to to consider biliary source for her pain.  Recommended she return to to see Dr. Candace Cruise -may consider repeat endoscopy.  Pt verbalized understanding of this discussion. She can f/oolow up here on as needed basis.

## 2013-08-30 ENCOUNTER — Other Ambulatory Visit: Payer: Self-pay

## 2013-08-30 DIAGNOSIS — Z1231 Encounter for screening mammogram for malignant neoplasm of breast: Secondary | ICD-10-CM

## 2013-09-06 ENCOUNTER — Ambulatory Visit
Admission: RE | Admit: 2013-09-06 | Discharge: 2013-09-06 | Disposition: A | Discharge status: Home / Self Care | Payer: 59 | Source: Ambulatory Visit | Attending: General Surgery | Admitting: General Surgery

## 2013-09-06 DIAGNOSIS — Z1231 Encounter for screening mammogram for malignant neoplasm of breast: Secondary | ICD-10-CM

## 2013-09-15 ENCOUNTER — Ambulatory Visit (INDEPENDENT_AMBULATORY_CARE_PROVIDER_SITE_OTHER): Payer: 59 | Admitting: General Surgery

## 2013-09-15 ENCOUNTER — Encounter: Payer: Self-pay | Admitting: General Surgery

## 2013-09-15 VITALS — BP 128/76 | HR 60 | Resp 14 | Ht 62.0 in | Wt 118.0 lb

## 2013-09-15 DIAGNOSIS — R1013 Epigastric pain: Secondary | ICD-10-CM

## 2013-09-15 DIAGNOSIS — Z872 Personal history of diseases of the skin and subcutaneous tissue: Secondary | ICD-10-CM

## 2013-09-15 NOTE — Progress Notes (Signed)
Patient ID: Connie Francis, female   DOB: [birth year], 43 y.o.   MRN: [id number]  Chief Complaint  Patient presents with  . Follow-up    mammogram    HPI Connie Francis is a 43 y.o. female.  who presents for her follow up mammogram and breast evaluation. The most recent mammogram was done on 09-06-13.  Patient does perform regular self breast checks and gets regular mammograms done.  No new breast issues. She states she is still having abdominal issues, like a belt around her. Described as bloated "trapped gas". Occasional diarrhea.  HPI  Past Medical History  Diagnosis Date  . Obsessive compulsive disorder   . Breast cyst     left breast, follow up with Sankar  . Kidney stones   . Thyroid disease     hyper  . CHF (congestive heart failure)   . Lump or mass in breast     R-Breast  . Anxiety   . IBS (irritable bowel syndrome)   . GERD (gastroesophageal reflux disease)     Past Surgical History  Procedure Laterality Date  . Dilation and curettage of uterus  1601    complicated by heart failure  . Abdominal hysterectomy  2012  . Cesarean section    . Upper endoscopy w/ esophageal manometry    . Colonoscopy  2014    Family History  Problem Relation Age of Onset  . Cancer Maternal Aunt     parotid Ca  . Stroke Paternal Aunt   . Cancer Paternal Uncle     lung, nasopharnygeal  . Hearing loss Maternal Grandmother   . Alcohol abuse Maternal Grandmother   . Arthritis Maternal Grandfather     Social History History  Substance Use Topics  . Smoking status: Never Smoker   . Smokeless tobacco: Never Used  . Alcohol Use: No     Comment: socially    Allergies  Allergen Reactions  . Latex     Swelling and itching   . Penicillins     swelling  . Promethazine   . Shellfish Allergy     hives  . Sulfa Antibiotics     Rash and itching   . Lamictal [Lamotrigine] Rash    Blisters     Current Outpatient Prescriptions  Medication Sig Dispense Refill  . ALPRAZolam  (XANAX) 0.25 MG tablet Take 0.25 mg by mouth 2 (two) times daily as needed for anxiety.      . mometasone (NASONEX) 50 MCG/ACT nasal spray Place 2 sprays into the nose daily.      . pantoprazole (PROTONIX) 40 MG tablet Take 40 mg by mouth daily.      . sertraline (ZOLOFT) 50 MG tablet Take 50 mg by mouth daily.      Marland Kitchen tiZANidine (ZANAFLEX) 4 MG tablet Take 4 mg by mouth.       No current facility-administered medications for this visit.    Review of Systems Review of Systems  Constitutional: Negative.   Respiratory: Negative.   Cardiovascular: Negative.     Blood pressure 128/76, pulse 60, resp. rate 14, height 5\' 2"  (1.575 m), weight 118 lb (53.524 kg).  Physical Exam Physical Exam  Constitutional: She is oriented to person, place, and time. She appears well-developed and well-nourished.  Eyes: Conjunctivae are normal. No scleral icterus.  Neck: Neck supple.  Cardiovascular: Normal rate, regular rhythm and normal heart sounds.   Pulmonary/Chest: Effort normal and breath sounds normal. Right breast exhibits no inverted nipple, no mass,  no nipple discharge, no skin change and no tenderness. Left breast exhibits no inverted nipple, no mass, no nipple discharge, no skin change and no tenderness.  Abdominal: Soft.  Lymphadenopathy:    She has no cervical adenopathy.    She has no axillary adenopathy.  Neurological: She is alert and oriented to person, place, and time.  Skin: Skin is warm and dry.    Data Reviewed Mammogram report reviewed and stable. HIDA scan from 08-13-13 reviewed and normal.  Assessment    Stable physical exam. History of breast cysts. Abd symptoms -appear more likely from IBS.     Plan    The patient has been asked to return to the office in one year for a bilateral screening mammogram.    Follow up with Ebony Cargo NP, GI regarding abdominal symptoms. HIDA scan 08-13-13 was normal.   SANKAR,SEEPLAPUTHUR G 09/16/2013, 12:12 PM

## 2013-09-15 NOTE — Patient Instructions (Addendum)
Continue self breast exams. Call office for any new breast issues or concerns. The patient has been asked to return to the office in one year for a bilateral screening mammogram.

## 2013-09-16 ENCOUNTER — Encounter: Payer: Self-pay | Admitting: General Surgery

## 2013-09-30 ENCOUNTER — Ambulatory Visit: Payer: Self-pay | Admitting: Physician Assistant

## 2013-11-12 ENCOUNTER — Ambulatory Visit: Payer: Self-pay | Admitting: Family Medicine

## 2013-11-15 ENCOUNTER — Encounter: Payer: Self-pay | Admitting: Family Medicine

## 2013-11-15 ENCOUNTER — Ambulatory Visit (INDEPENDENT_AMBULATORY_CARE_PROVIDER_SITE_OTHER): Payer: 59 | Admitting: Family Medicine

## 2013-11-15 VITALS — BP 110/64 | HR 63 | Temp 98.9°F | Resp 14 | Wt 117.5 lb

## 2013-11-15 DIAGNOSIS — J189 Pneumonia, unspecified organism: Secondary | ICD-10-CM

## 2013-11-15 NOTE — Progress Notes (Signed)
   Dr. Frederico Hamman T. Tobi Groesbeck, MD, [facility] [place], [address] Phone: [phone] Fax: [phone]  11/15/2013  Patient: Connie Francis, MRN: [id number], DOB: October 21, 1970, 43 y.o.  Primary Physician:  Deborra Medina, MD  Chief Complaint: Fever  Subjective:   Connie Francis is a 43 y.o. very pleasant female patient who presents with the following:  2 weeks, not feeling well and about 4 weeks ago thought viral. Some tussionex at nigh tand then about 2 weeks ago and same symptoms, not much drainage. Works for [employer] and went to Urgent Care. Not coughing a whole lot.   Thought that he had walking pneumonia. Still did not feel well and had a temp of 100. Basilar lower lungs.  Currently on Zpak Tessalon and Tussionex  Past Medical History, Surgical History, Social History, Family History, Problem List, Medications, and Allergies have been reviewed and updated if relevant.  ROS: GEN: Acute illness details above GI: Tolerating PO intake GU: maintaining adequate hydration and urination Pulm: No SOB Interactive and getting along well at home.  Otherwise, ROS is as per the HPI.   Objective:   BP 110/64 mmHg  Pulse 63  Temp(Src) 98.9 F (37.2 C) (Oral)  Resp 14  Wt 117 lb 8 oz (53.298 kg)  SpO2 97%   GEN: A and O x 3. WDWN. NAD.    ENT: Nose clear, ext NML.  No LAD.  No JVD.  TM's clear. Oropharynx clear.  PULM: Normal WOB, no distress. No crackles, wheezes, rare basilar rhonchi. CV: RRR, no M/G/R, No rubs, No JVD.   EXT: warm and well-perfused, No c/c/e. PSYCH: Pleasant and conversant.    Laboratory and Imaging Data:  Assessment and Plan:   Walking pneumonia  She is on appropriate treatment already. Call if she is not better after 10 days. 10 days of antibiotics.  Follow-up: No Follow-up on file.  New Prescriptions   No medications on file   No orders of the defined types were placed in this  encounter.    Signed,  Maud Deed. Jomaira Darr, MD   Patient's Medications  New Prescriptions   No medications on file  Previous Medications   ALPRAZOLAM (XANAX) 0.25 MG TABLET    Take 0.25 mg by mouth 2 (two) times daily as needed for anxiety.   AZITHROMYCIN (ZITHROMAX Z-PAK) 250 MG TABLET    Take 250 mg by mouth daily.   IBUPROFEN (ADVIL,MOTRIN) 600 MG TABLET    Take 600 mg by mouth every 6 (six) hours as needed.   MOMETASONE (NASONEX) 50 MCG/ACT NASAL SPRAY    Place 2 sprays into the nose daily.   PANTOPRAZOLE (PROTONIX) 40 MG TABLET    Take 40 mg by mouth daily.   SERTRALINE (ZOLOFT) 50 MG TABLET    Take 50 mg by mouth daily.  Modified Medications   No medications on file  Discontinued Medications   AZITHROMYCIN (ZITHROMAX) 500 MG TABLET    Take 500 mg by mouth daily.   TIZANIDINE (ZANAFLEX) 4 MG TABLET    Take 4 mg by mouth.

## 2013-11-15 NOTE — Progress Notes (Signed)
Pre visit review using our clinic review tool, if applicable. No additional management support is needed unless otherwise documented below in the visit note. 

## 2013-11-23 ENCOUNTER — Encounter: Payer: Self-pay | Admitting: Family Medicine

## 2013-11-24 NOTE — Telephone Encounter (Signed)
Can you help this patient get a 15 minute f/u with me today or tomorrow?  Electronically Signed  By: Owens Loffler, MD On: 11/24/2013 7:52 AM

## 2013-11-25 ENCOUNTER — Encounter: Payer: Self-pay | Admitting: Family Medicine

## 2013-11-25 ENCOUNTER — Ambulatory Visit (INDEPENDENT_AMBULATORY_CARE_PROVIDER_SITE_OTHER): Payer: 59 | Admitting: Family Medicine

## 2013-11-25 VITALS — BP 130/90 | HR 60 | Temp 98.7°F | Ht 62.0 in | Wt 117.2 lb

## 2013-11-25 DIAGNOSIS — J9801 Acute bronchospasm: Secondary | ICD-10-CM

## 2013-11-25 MED ORDER — PREDNISONE 20 MG PO TABS: ORAL_TABLET | ORAL | Status: DC

## 2013-11-25 MED ORDER — ALBUTEROL SULFATE HFA 108 (90 BASE) MCG/ACT IN AERS: 2.0000 | INHALATION_SPRAY | Freq: Four times a day (QID) | RESPIRATORY_TRACT | Status: DC | PRN

## 2013-11-25 NOTE — Progress Notes (Signed)
Dr. Frederico Hamman T. Gregori Abril, MD, [facility] [place], [address] Phone: [phone] Fax: [phone]  11/25/2013  Patient: Connie Francis, MRN: [id number], DOB: Aug 12, 1970, 43 y.o.  Primary Physician:  Deborra Medina, MD  Chief Complaint: Follow-up  Subjective:   Connie Francis is a 43 y.o. very pleasant female patient who presents with the following:  Still does not look good. Has been trying to feel some tightness in her chest. Next to her lung on the left, some scratch on the left. Bad night sweats. BP - high today. Rare wheezing. Coughing some still.  135/90  11/15/2013 Last OV with Owens Loffler, MD  2 weeks, not feeling well and about 4 weeks ago thought viral. Some tussionex at nigh t[facility] then about 2 weeks ago and same symptoms, not much drainage. Works for [employer] and went to Urgent Care. Not coughing a whole lot.   Thought that he had walking pneumonia. Still did not feel well and had a temp of 100. Basilar lower lungs.  Currently on Zpak Tessalon and Tussionex  Past Medical History, Surgical History, Social History, Family History, Problem List, Medications, and Allergies have been reviewed and updated if relevant.  ROS: GEN: Acute illness details above GI: Tolerating PO intake GU: maintaining adequate hydration and urination Pulm: No SOB Interactive and getting along well at home.  Otherwise, ROS is as per the HPI.   Objective:   BP 173/91 mmHg  Pulse 60  Temp(Src) 98.7 F (37.1 C) (Oral)  Ht 5\' 2"  (1.575 m)  Wt 117 lb 4 oz (53.184 kg)  BMI 21.44 kg/m2  SpO2 98%   GEN: A and O x 3. WDWN. NAD.    ENT: Nose clear, ext NML.  No LAD.  No JVD.  TM's clear. Oropharynx clear.  PULM: Normal WOB, no distress. No crackles, rare b wheeze CV: RRR, no M/G/R, No rubs, No JVD.   EXT: warm and well-perfused, No c/c/e. PSYCH: Pleasant and conversant.    Laboratory and Imaging Data:  Assessment and  Plan:   Post-infection bronchospasm  Post-walking pna with mild bronchospasm  Follow-up: No Follow-up on file.  New Prescriptions   ALBUTEROL (PROVENTIL HFA;VENTOLIN HFA) 108 (90 BASE) MCG/ACT INHALER    Inhale 2 puffs into the lungs every 6 (six) hours as needed for wheezing or shortness of breath.   PREDNISONE (DELTASONE) 20 MG TABLET    2 tabs po for 3 days, then 1 tab for 2 days   No orders of the defined types were placed in this encounter.    Signed,  Maud Deed. Naimah Yingst, MD   Patient's Medications  New Prescriptions   ALBUTEROL (PROVENTIL HFA;VENTOLIN HFA) 108 (90 BASE) MCG/ACT INHALER    Inhale 2 puffs into the lungs every 6 (six) hours as needed for wheezing or shortness of breath.   PREDNISONE (DELTASONE) 20 MG TABLET    2 tabs po for 3 days, then 1 tab for 2 days  Previous Medications   ALPRAZOLAM (XANAX) 0.25 MG TABLET    Take 0.25 mg by mouth 2 (two) times daily as needed for anxiety.   CITALOPRAM (CELEXA) 20 MG TABLET    Take 20 mg by mouth daily.    IBUPROFEN (ADVIL,MOTRIN) 600 MG TABLET    Take 600 mg by mouth every 6 (six) hours as needed.   PANTOPRAZOLE (PROTONIX) 40 MG TABLET    Take 40 mg by mouth daily.   SERTRALINE (ZOLOFT)  50 MG TABLET    Take 50 mg by mouth daily.  Modified Medications   No medications on file  Discontinued Medications   AZITHROMYCIN (ZITHROMAX Z-PAK) 250 MG TABLET    Take 250 mg by mouth daily.   MOMETASONE (NASONEX) 50 MCG/ACT NASAL SPRAY    Place 2 sprays into the nose daily.

## 2013-11-25 NOTE — Progress Notes (Signed)
Pre visit review using our clinic review tool, if applicable. No additional management support is needed unless otherwise documented below in the visit note. 

## 2013-12-24 ENCOUNTER — Ambulatory Visit: Payer: Self-pay

## 2014-01-04 ENCOUNTER — Telehealth: Payer: Self-pay

## 2014-01-04 NOTE — Telephone Encounter (Signed)
Called in [place] for sinus infection to [facility].

## 2014-01-26 ENCOUNTER — Encounter: Payer: Self-pay | Admitting: Internal Medicine

## 2014-01-26 ENCOUNTER — Ambulatory Visit (INDEPENDENT_AMBULATORY_CARE_PROVIDER_SITE_OTHER): Payer: 59 | Admitting: Internal Medicine

## 2014-01-26 VITALS — BP 118/76 | HR 76 | Temp 97.6°F | Resp 14 | Ht 62.0 in | Wt 121.0 lb

## 2014-01-26 DIAGNOSIS — J189 Pneumonia, unspecified organism: Secondary | ICD-10-CM

## 2014-01-26 DIAGNOSIS — Z0000 Encounter for general adult medical examination without abnormal findings: Secondary | ICD-10-CM

## 2014-01-26 MED ORDER — FLUTICASONE PROPIONATE HFA 220 MCG/ACT IN AERO: 2.0000 | INHALATION_SPRAY | Freq: Two times a day (BID) | RESPIRATORY_TRACT | Status: DC

## 2014-01-26 MED ORDER — AEROCHAMBER MV MISC: Status: DC

## 2014-01-26 NOTE — Progress Notes (Signed)
Patient ID: Connie Francis, female   DOB: 08-May-1970, 44 y.o.   MRN: [id number]  Patient Active Problem List   Diagnosis Date Noted  . Pneumonitis 01/29/2014  . Palpitations 07/19/2013  . S/P hysterectomy 07/06/2013  . Edema 01/18/2013  . Elevated blood pressure 01/18/2013  . Psychogenic dyspepsia 11/27/2012  . Dizziness and giddiness 10/27/2012  . Cervicalgia 08/14/2012  . Routine general medical examination at a health care facility 06/18/2012  . Diffuse cystic mastopathy 06/18/2012  . Depression with anxiety 01/07/2012  . Irritable bowel syndrome with constipation 12/06/2011  . Breast cyst   . Obsessive compulsive disorder   . Postpartum cardiomyopathy 09/13/2010    Subjective:  CC:   Chief Complaint  Patient presents with  . Follow-up    Patient stated she has continued to have back pain and radiates to front.  . Back Pain    Exercising makes it worse like burning type pain    HPI:   Connie Francis is a 44 y.o. female who presents for  Persistent back pain, burning quality, for the past week or so.  Was diagnosed  With walking pneumonia mid October when she developed chills and night sweats, temp to 100.0  . First treatment was z pak.  Symptoms persisted so she saw [facility] at [facility] Nov 1 and no changes were made.  After 10 days she returned with  continued  dyspnea with exertion, and received an albuterol MDI and a prednisone taper on Nov 12.   Her dyspneic symptoms improved., but she developed  a burning sensation in back and chest which is aggravated by exertion, and  Returned to Urgent Care .  Repeat CVR was nomral reportedly.  Using MDI only when starts having coughing spells, no wheezing spells. She had a 4th visit ,which was to    At no time did she have a productive cough ,  Just an occasional hacking cough.  But states that the antibiotic made her breathe better.   Still short of breath ascending the stadium stairs.    Past Medical History  Diagnosis Date  .  Obsessive compulsive disorder   . Breast cyst     left breast, follow up with Sankar  . Kidney stones   . Thyroid disease     hyper  . CHF (congestive heart failure)   . Lump or mass in breast     R-Breast  . Anxiety   . IBS (irritable bowel syndrome)   . GERD (gastroesophageal reflux disease)     Past Surgical History  Procedure Laterality Date  . Dilation and curettage of uterus  0737    complicated by heart failure  . Abdominal hysterectomy  2012  . Cesarean section    . Upper endoscopy w/ esophageal manometry    . Colonoscopy  2014       The following portions of the patient's history were reviewed and updated as appropriate: Allergies, current medications, and problem list.    Review of Systems:   Patient denies headache, fevers, malaise, unintentional weight loss, skin rash, eye pain, sinus congestion and sinus pain, sore throat, dysphagia,  hemoptysis , cough, dyspnea, wheezing, chest pain, palpitations, orthopnea, edema, abdominal pain, nausea, melena, diarrhea, constipation, flank pain, dysuria, hematuria, urinary  Frequency, nocturia, numbness, tingling, seizures,  Focal weakness, Loss of consciousness,  Tremor, insomnia, depression, anxiety, and suicidal ideation.     History   Social History  . Marital Status: Married    Spouse Name: N/A  Number of Children: N/A  . Years of Education: N/A   Occupational History  . Not on file.   Social History Main Topics  . Smoking status: Never Smoker   . Smokeless tobacco: Never Used  . Alcohol Use: No     Comment: socially  . Drug Use: No  . Sexual Activity: No   Other Topics Concern  . Not on file   Social History Narrative    Objective:  Filed Vitals:   01/26/14 1508  BP: 118/76  Pulse: 76  Temp: 97.6 F (36.4 C)  Resp: 14     General appearance: alert, cooperative and appears stated age Ears: normal TM's and external ear canals both ears Throat: lips, mucosa, and tongue normal; teeth and  gums normal Neck: no adenopathy, no carotid bruit, supple, symmetrical, trachea midline and thyroid not enlarged, symmetric, no tenderness/mass/nodules Back: symmetric, no curvature. ROM normal. No CVA tenderness. Lungs: clear to auscultation bilaterally Heart: regular rate and rhythm, S1, S2 normal, no murmur, click, rub or gallop Abdomen: soft, non-tender; bowel sounds normal; no masses,  no organomegaly Pulses: 2+ and symmetric Skin: Skin color, texture, turgor normal. No rashes or lesions Lymph nodes: Cervical, supraclavicular, and axillary nodes normal.  Assessment and Plan:  Pneumonitis Suggested by persistent symptoms post episode of walking pneumonia diagnosed in October,  With repeat x ray done in December by Urgent care reportedly normal.  Will cotinue prn aluterol and add steroid inhaler for 2 weeks .  Given her nomal exam ,  Will avoid repeating a prednisone taper.     Updated Medication List Outpatient Encounter Prescriptions as of 01/26/2014  Medication Sig  . albuterol (PROVENTIL HFA;VENTOLIN HFA) 108 (90 BASE) MCG/ACT inhaler Inhale 2 puffs into the lungs every 6 (six) hours as needed for wheezing or shortness of breath.  . ALPRAZolam (XANAX) 0.25 MG tablet Take 0.25 mg by mouth 2 (two) times daily as needed for anxiety.  . citalopram (CELEXA) 20 MG tablet Take 20 mg by mouth daily.   Marland Kitchen ibuprofen (ADVIL,MOTRIN) 600 MG tablet Take 600 mg by mouth every 6 (six) hours as needed.  . sertraline (ZOLOFT) 50 MG tablet Take 50 mg by mouth daily.  . [DISCONTINUED] pantoprazole (PROTONIX) 40 MG tablet Take 40 mg by mouth daily.  . fluticasone (FLOVENT HFA) 220 MCG/ACT inhaler Inhale 2 puffs into the lungs 2 (two) times daily.  Marland Kitchen Spacer/Aero-Holding Chambers (AEROCHAMBER MV) inhaler Use as instructed  . [DISCONTINUED] predniSONE (DELTASONE) 20 MG tablet 2 tabs po for 3 days, then 1 tab for 2 days (Patient not taking: Reported on 01/26/2014)     No orders of the defined types were  placed in this encounter.    No Follow-up on file. A total of 25 minutes was spent with patient more than half of which was spent in counseling patient on the above mentioned issues , reviewing prior imaging studies done, and coordination of care.

## 2014-01-26 NOTE — Patient Instructions (Signed)
Your lungs sound fine,  But they may still be irritated by the infection  i am going to prescribe a steroid inhaler to use twice daily with the "air chamber" to make sure the medicine gets down into your lungs to calm down any inflammation that might be there  Make sure you rinse your mouth to prevent 'thrush"

## 2014-01-26 NOTE — Progress Notes (Signed)
Pre-visit discussion using our clinic review tool. No additional management support is needed unless otherwise documented below in the visit note.  

## 2014-01-29 DIAGNOSIS — J189 Pneumonia, unspecified organism: Secondary | ICD-10-CM | POA: Insufficient documentation

## 2014-01-29 NOTE — Assessment & Plan Note (Signed)
Suggested by persistent symptoms post episode of walking pneumonia diagnosed in October,  With repeat x ray done in December by Urgent care reportedly normal.  Will cotinue prn aluterol and add steroid inhaler for 2 weeks .  Given her nomal exam ,  Will avoid repeating a prednisone taper.

## 2014-02-10 ENCOUNTER — Encounter: Payer: Self-pay | Admitting: Internal Medicine

## 2014-02-10 DIAGNOSIS — J209 Acute bronchitis, unspecified: Secondary | ICD-10-CM

## 2014-03-03 ENCOUNTER — Ambulatory Visit (INDEPENDENT_AMBULATORY_CARE_PROVIDER_SITE_OTHER): Payer: 59 | Admitting: Internal Medicine

## 2014-03-03 ENCOUNTER — Encounter: Payer: Self-pay | Admitting: Internal Medicine

## 2014-03-03 ENCOUNTER — Institutional Professional Consult (permissible substitution): Payer: 59 | Admitting: Internal Medicine

## 2014-03-03 VITALS — BP 112/80 | HR 76 | Temp 98.2°F | Ht 62.0 in | Wt 121.8 lb

## 2014-03-03 DIAGNOSIS — R0609 Other forms of dyspnea: Secondary | ICD-10-CM

## 2014-03-03 DIAGNOSIS — R0789 Other chest pain: Secondary | ICD-10-CM

## 2014-03-03 NOTE — Progress Notes (Signed)
Date: 03/03/2014  MRN# [id number] Connie Francis May 31, 1970  Referring Physician: Dr. Theodosia Blender is a 44 y.o. old female seen in consultation for cough and shortness of breath  CC:  Chief Complaint  Patient presents with  . Advice Only    Pt had pneumonia late Oct, 2015. She has been on abx, steroid and inhalers. She has a non-productive cough, sob with exhertion and chest tightness with exhertion. Pt does not have wheezing but she has a burning in her left lung.    HPI:   Patient is a pleasant 44 year old female is presenting today with a complaint of persistent cough and shortness of breath since October 2015. History per patient. In October 2015 patient noted fever, night sweats, chills, she visited urgent care mostly for this shortness of breath and chest tightness, she was diagnosed with possible pneumonia and given a Z-Pak x2; at that time she had no cough. In November 2015 she continued to have shortness of breath and chest tightness, patient was seen at [facility], was given an inhaler and steroids which she stated helped a little. In December 2015 shortness of breath and chest tightness returned, she had a chest x-ray which was negative for any acute cardiopulmonary disease and she was advised close followup. In January 2016 she saw the PMD again for shortness of breath and chest tightness she was given an inhaler (Flovent), and was advised to followup with pulmonary. Patient stated she had a similar episode 3 years ago, she saw Dr. Vella Kohler, PFTs at that time were essentially normal, she was given a short course of oral steroids and was placed on Pulmicort which improved her symptoms; however she only did the Pulmicort for 3 months and then stopped on her own. 3 years ago she also had a upper endoscopy for episodes of coughing/choking sensation after eating, upper endoscopy was essentially normal. Today patient states her main complaint is dyspnea on exertion she is normally  able to walk briskly for 2 miles however is only able to walk about a mile before feeling short of breath and having a sensation of "burning and left lungs". Today she denies any fever, night sweats, chills, increased sputum production. She has a history of secondhand smoke exposure from birth to age of 23 appearance. Pets: 1 cat one dog. Employment: Careers information officer. She has a history of postpartum cardiomyopathy followed by mild CHF which resolved however she has a history of PVCs followed by cardiology (Dr. Fletcher Anon [facility]), not on any treatment at this time (PVCs thought to be anxiety driven).   PMHX:   Past Medical History  Diagnosis Date  . Obsessive compulsive disorder   . Breast cyst     left breast, follow up with [facility]  . Kidney stones   . Thyroid disease     hyper  . CHF (congestive heart failure)   . Lump or mass in breast     R-Breast  . Anxiety   . IBS (irritable bowel syndrome)   . GERD (gastroesophageal reflux disease)    Surgical Hx:  Past Surgical History  Procedure Laterality Date  . Dilation and curettage of uterus  0932    complicated by heart failure  . Abdominal hysterectomy  2012  . Cesarean section    . Upper endoscopy w/ esophageal manometry    . Colonoscopy  2014   Family Hx:  Family History  Problem Relation Age of Onset  . Cancer Maternal Aunt  parotid Ca  . Stroke Paternal Aunt   . Cancer Paternal Uncle     lung, nasopharnygeal  . Hearing loss Maternal Grandmother   . Alcohol abuse Maternal Grandmother   . Arthritis Maternal Grandfather    Social Hx:   History  Substance Use Topics  . Smoking status: Never Smoker   . Smokeless tobacco: Never Used  . Alcohol Use: No     Comment: socially   Medication:   Current Outpatient Rx  Name  Route  Sig  Dispense  Refill  . albuterol (PROVENTIL HFA;VENTOLIN HFA) 108 (90 BASE) MCG/ACT inhaler   Inhalation   Inhale 2 puffs into the lungs every 6 (six) hours as needed for wheezing or  shortness of breath.   1 Inhaler   0   . ALPRAZolam (XANAX) 0.25 MG tablet   Oral   Take 0.25 mg by mouth 2 (two) times daily as needed for anxiety.         . citalopram (CELEXA) 20 MG tablet   Oral   Take 20 mg by mouth daily.       2   . ibuprofen (ADVIL,MOTRIN) 600 MG tablet   Oral   Take 600 mg by mouth every 6 (six) hours as needed.         . sertraline (ZOLOFT) 50 MG tablet   Oral   Take 50 mg by mouth daily.         Marland Kitchen Spacer/Aero-Holding Chambers (AEROCHAMBER MV) inhaler      Use as instructed   1 each   2     Use with flovent inhaler   . fluticasone (FLOVENT HFA) 220 MCG/ACT inhaler   Inhalation   Inhale 2 puffs into the lungs 2 (two) times daily. Patient not taking: Reported on 03/03/2014   1 Inhaler   12     Use with an aerochamber       Allergies:  Latex; Penicillins; Promethazine; Shellfish allergy; Sulfa antibiotics; and Lamictal  Review of Systems: Gen:  Denies  fever, sweats, chills HEENT: Denies blurred vision, double vision, ear pain, eye pain, hearing loss, nose bleeds, sore throat Cvc:  No dizziness, chest pain or heaviness Resp:   Mild cough and shortness of breath with exertion Gi: Denies swallowing difficulty, stomach pain, nausea or vomiting, diarrhea, constipation, bowel incontinence Gu:  Denies bladder incontinence, burning urine Ext:   No Joint pain, stiffness or swelling Skin: No skin rash, easy bruising or bleeding or hives Endoc:  No polyuria, polydipsia , polyphagia or weight change Psych: No depression, insomnia or hallucinations  Other:  All other systems negative  Physical Examination:   VS: BP 112/80 mmHg  Pulse 76  Temp(Src) 98.2 F (36.8 C) (Oral)  Ht 5\' 2"  (1.575 m)  Wt 121 lb 12.8 oz (55.248 kg)  BMI 22.27 kg/m2  SpO2 97%  General Appearance: No distress  Neuro:without focal findings, mental status, speech normal, alert and oriented, cranial nerves 2-12 intact, reflexes normal and symmetric, sensation  grossly normal  HEENT: PERRLA, EOM intact, no ptosis, no other lesions noticed; Mallampati 2 Pulmonary: normal breath sounds., diaphragmatic excursion normal.No wheezing, No rales;   Sputum Production:   CardiovascularNormal S1,S2.  No m/r/g.  Abdominal aorta pulsation normal.    Abdomen: Benign, Soft, non-tender, No masses, hepatosplenomegaly, No lymphadenopathy Renal:  No costovertebral tenderness  GU:  No performed at this time. Endoc: No evident thyromegaly, no signs of acromegaly or Cushing features Skin:   warm, no rashes, no ecchymosis  Extremities: normal, no cyanosis, clubbing, no edema, warm with normal capillary refill. Other findings:none   Rad results: (The following images and results were reviewed by Dr. Stevenson Clinch). 2 view CXR 12/2013 FINDINGS: The lungs are clear and negative for focal airspace consolidation, pulmonary edema or suspicious pulmonary nodule. No pleural effusion or pneumothorax. Cardiac and mediastinal contours are within normal limits. No acute fracture or lytic or blastic osseous lesions. The visualized upper abdominal bowel gas pattern is unremarkable.   IMPRESSION: Negative chest x-ray.   Pulmonary function test 01/01/2012 FVC 2.97, 103% FEV1 2.31, 93% FEV1/FVC 78%  Assessment and Plan: DOE (dyspnea on exertion) Differential diagnosis includes: Asthma, COPD, obstructive disease, restrictive disease, PVCs, anxiety.  I believe that the patient's shortness of breath and chest tightness might be related to mild obstructive disease, possibly asthma. She has similar episode 3 years ago seen by Dr. Vella Kohler and treated with inhaled steroid which provided moderate improvement.  Plan: -CT chest without contrast-to further evaluate any inflammatory, infectious process that could be causing chest tightness and shortness of breath -Pulmonary function testing, 6 minute walk testing. -Followup with cardiology given history of PVCs, possible evaluation for Holter  monitor -Continue with Flovent   Chest tightness Plan as stated for dyspnea on exertion     Updated Medication List Outpatient Encounter Prescriptions as of 03/03/2014  Medication Sig  . albuterol (PROVENTIL HFA;VENTOLIN HFA) 108 (90 BASE) MCG/ACT inhaler Inhale 2 puffs into the lungs every 6 (six) hours as needed for wheezing or shortness of breath.  . ALPRAZolam (XANAX) 0.25 MG tablet Take 0.25 mg by mouth 2 (two) times daily as needed for anxiety.  . citalopram (CELEXA) 20 MG tablet Take 20 mg by mouth daily.   Marland Kitchen ibuprofen (ADVIL,MOTRIN) 600 MG tablet Take 600 mg by mouth every 6 (six) hours as needed.  . sertraline (ZOLOFT) 50 MG tablet Take 50 mg by mouth daily.  Marland Kitchen Spacer/Aero-Holding Chambers (AEROCHAMBER MV) inhaler Use as instructed  . fluticasone (FLOVENT HFA) 220 MCG/ACT inhaler Inhale 2 puffs into the lungs 2 (two) times daily. (Patient not taking: Reported on 03/03/2014)    Orders for this visit: Orders Placed This Encounter  Procedures  . CT Chest Wo Contrast    Standing Status: Future     Number of Occurrences:      Standing Expiration Date: 05/02/2015    Scheduling Instructions:     Schedule close to 04-06-14.    Order Specific Question:  Reason for Exam (SYMPTOM  OR DIAGNOSIS REQUIRED)    Answer:  SOB, chest tightness    Order Specific Question:  Is the patient pregnant?    Answer:  No    Order Specific Question:  Preferred imaging location?    Answer:  [facility]     Thank  you for the consultation and for allowing [facility] to assist in the care of your patient. Our recommendations are noted above.  Please contact us if we can be of further service.   Vilinda Boehringer, MD [facility] Office Number: [phone]     YJWL Cont with flovent Pfts, 19mwt, ct chest F\u with caridology for hx of PVCs

## 2014-03-03 NOTE — Assessment & Plan Note (Signed)
Differential diagnosis includes: Asthma, COPD, obstructive disease, restrictive disease, PVCs, anxiety.  I believe that the patient's shortness of breath and chest tightness might be related to mild obstructive disease, possibly asthma. She has similar episode 3 years ago seen by Dr. Vella Kohler and treated with inhaled steroid which provided moderate improvement.  Plan: -CT chest without contrast-to further evaluate any inflammatory, infectious process that could be causing chest tightness and shortness of breath -Pulmonary function testing, 6 minute walk testing. -Followup with cardiology given history of PVCs, possible evaluation for Holter monitor -Continue with Flovent

## 2014-03-03 NOTE — Assessment & Plan Note (Signed)
Plan as stated for dyspnea on exertion

## 2014-03-03 NOTE — Patient Instructions (Signed)
Follow up with Dr. Stevenson Clinch in 1 month 1. Pulmonary function testing and 6 min walk test prior to next visit 2. CT Chest without contrast for dyspnea and chest tightness prior to next visit 3. Continue with as needed albuterol 4. Continue with Flovent 2puff twice a day, and rinse after each use 5. Schedule follow up with Dr. Fletcher Anon (Cardiology) for hx of PVCs as these could be causing your symptoms

## 2014-03-10 ENCOUNTER — Telehealth: Payer: 59 | Admitting: Physician Assistant

## 2014-03-10 DIAGNOSIS — R399 Unspecified symptoms and signs involving the genitourinary system: Secondary | ICD-10-CM

## 2014-03-10 MED ORDER — CIPROFLOXACIN HCL 500 MG PO TABS: 500.0000 mg | ORAL_TABLET | Freq: Two times a day (BID) | ORAL | Status: DC

## 2014-03-10 MED ORDER — FLUCONAZOLE 150 MG PO TABS: 150.0000 mg | ORAL_TABLET | Freq: Once | ORAL | Status: DC

## 2014-03-10 NOTE — Progress Notes (Signed)
We are sorry that you are not feeling well.  Here is how we plan to help!  Based on what you shared with me it looks like you most likely have a simple urinary tract infection.  A UTI (Urinary Tract Infection) is a bacterial infection of the bladder.  Most cases of urinary tract infections are simple to treat but a key part of your care is to encourage you to drink plenty of fluids and watch your symptoms carefully.  I have prescribed Ciprofloxacin 500 mg twice a day for 5 days.  Your symptoms should gradually improve. Call us if the burning in your urine worsens, you develop worsening fever, back pain or pelvic pain or if your symptoms do not resolve after completing the antibiotic.  Urinary tract infections can be prevented by drinking plenty of water to keep your body hydrated.  Also be sure when you wipe, wipe from front to back and don't hold it in!  If possible, empty your bladder every 4 hours.  Your e-visit answers were reviewed by a board certified advanced clinical practitioner to complete your personal care plan.  Depending on the condition, your plan could have included both over the counter or prescription medications.  If there is a problem please reply  once you have received a response from your provider.  Your safety is important to us.  If you have drug allergies check your prescription carefully.    You can use MyChart to ask questions about today's visit, request a non-urgent call back, or ask for a work or school excuse.  You will get an e-mail in the next two days asking about your experience.  I hope that your e-visit has been valuable and will speed your recovery. Thank you for using e-visits.    

## 2014-03-11 ENCOUNTER — Ambulatory Visit: Payer: Self-pay | Admitting: Internal Medicine

## 2014-03-17 ENCOUNTER — Other Ambulatory Visit: Payer: Self-pay | Admitting: Internal Medicine

## 2014-03-17 DIAGNOSIS — R0609 Other forms of dyspnea: Principal | ICD-10-CM

## 2014-03-24 ENCOUNTER — Encounter: Payer: Self-pay | Admitting: Internal Medicine

## 2014-03-31 ENCOUNTER — Ambulatory Visit: Payer: 59 | Admitting: Internal Medicine

## 2014-04-06 ENCOUNTER — Ambulatory Visit: Payer: 59 | Admitting: Internal Medicine

## 2014-04-06 ENCOUNTER — Ambulatory Visit: Payer: 59

## 2014-04-12 ENCOUNTER — Ambulatory Visit (INDEPENDENT_AMBULATORY_CARE_PROVIDER_SITE_OTHER): Payer: 59 | Admitting: Cardiovascular Disease

## 2014-04-12 ENCOUNTER — Encounter: Payer: Self-pay | Admitting: Cardiovascular Disease

## 2014-04-12 VITALS — BP 140/90 | HR 63 | Ht 62.0 in | Wt 123.5 lb

## 2014-04-12 DIAGNOSIS — R030 Elevated blood-pressure reading, without diagnosis of hypertension: Secondary | ICD-10-CM

## 2014-04-12 DIAGNOSIS — R002 Palpitations: Secondary | ICD-10-CM | POA: Diagnosis not present

## 2014-04-12 DIAGNOSIS — R079 Chest pain, unspecified: Secondary | ICD-10-CM

## 2014-04-12 DIAGNOSIS — IMO0001 Reserved for inherently not codable concepts without codable children: Secondary | ICD-10-CM

## 2014-04-12 NOTE — Assessment & Plan Note (Signed)
These have resolved completely and was likely triggered by anxiety. Recent shortness of breath, wheezing and chest tightness were likely related to a pulmonary etiology. Symptoms improved significantly with treatment and she is almost back to baseline. Thus, I do not recommend further cardiac workup. Exam does not reveal any premature beats and EKG is also unremarkable.

## 2014-04-12 NOTE — Progress Notes (Signed)
HPI  This is a 44 -year-old pleasant female who is here today for a followup visit regarding palpitations due to mild PVCs and PACs in the setting of significant anxiety. She had an echocardiogram in August of 2013 which showed normal LV systolic function with no significant valvular abnormalities.  Ambulatory blood pressure monitor showed frequent elevation of blood pressure above 140/90 mostly during the day. The blood pressure went back quickly to normal. Most of her symptoms were attributed to anxiety.  She had pneumonia in October of last year with persistent pulmonary symptoms of cough wheezing and chest tightness. CT scan of the chest showed no significant abnormalities. Ultimately, symptoms improved with steroids. She denies any palpitations or tachycardia. She is actually feeling better. Anxiety improved significantly after she switch her job.  Allergies  Allergen Reactions  . Latex     Swelling and itching   . Penicillins     swelling  . Promethazine   . Shellfish Allergy     hives  . Sulfa Antibiotics     Rash and itching   . Lamictal [Lamotrigine] Rash    Blisters      Current Outpatient Prescriptions on File Prior to Visit  Medication Sig Dispense Refill  . albuterol (PROVENTIL HFA;VENTOLIN HFA) 108 (90 BASE) MCG/ACT inhaler Inhale 2 puffs into the lungs every 6 (six) hours as needed for wheezing or shortness of breath. 1 Inhaler 0  . ALPRAZolam (XANAX) 0.25 MG tablet Take 0.25 mg by mouth 2 (two) times daily as needed for anxiety.    . citalopram (CELEXA) 20 MG tablet Take 20 mg by mouth daily.   2  . fluticasone (FLOVENT HFA) 220 MCG/ACT inhaler Inhale 2 puffs into the lungs 2 (two) times daily. 1 Inhaler 12  . ibuprofen (ADVIL,MOTRIN) 600 MG tablet Take 600 mg by mouth every 6 (six) hours as needed.    . sertraline (ZOLOFT) 50 MG tablet Take 50 mg by mouth daily.    Marland Kitchen Spacer/Aero-Holding Chambers (AEROCHAMBER MV) inhaler Use as instructed 1 each 2   No current  facility-administered medications on file prior to visit.     Past Medical History  Diagnosis Date  . Obsessive compulsive disorder   . Breast cyst     left breast, follow up with S[facility]  . Kidney stones   . Thyroid disease     hyper  . CHF (congestive heart failure)   . Lump or mass in breast     R-Breast  . Anxiety   . IBS (irritable bowel syndrome)   . GERD (gastroesophageal reflux disease)      Past Surgical History  Procedure Laterality Date  . Dilation and curettage of uterus  7867    complicated by heart failure  . Abdominal hysterectomy  2012  . Cesarean section    . Upper endoscopy w/ esophageal manometry    . Colonoscopy  2014     Family History  Problem Relation Age of Onset  . Cancer Maternal Aunt     parotid Ca  . Stroke Paternal Aunt   . Cancer Paternal Uncle     lung, nasopharnygeal  . Hearing loss Maternal Grandmother   . Alcohol abuse Maternal Grandmother   . Arthritis Maternal Grandfather      History   Social History  . Marital Status: Married    Spouse Name: N/A  . Number of Children: N/A  . Years of Education: N/A   Occupational History  . Not on file.  Social History Main Topics  . Smoking status: Never Smoker   . Smokeless tobacco: Never Used  . Alcohol Use: No     Comment: socially  . Drug Use: No  . Sexual Activity: No   Other Topics Concern  . Not on file   Social History Narrative      PHYSICAL EXAM   BP 140/90 mmHg  Pulse 63  Ht 5\' 2"  (1.575 m)  Wt 123 lb 8 oz (56.019 kg)  BMI 22.58 kg/m2 Constitutional: She is oriented to person, place, and time. She appears well-developed and well-nourished. No distress.  HENT: No nasal discharge.  Head: Normocephalic and atraumatic.  Eyes: Pupils are equal and round. Right eye exhibits no discharge. Left eye exhibits no discharge.  Neck: Normal range of motion. Neck supple. No JVD present. No thyromegaly present.  Cardiovascular: Normal rate, regular rhythm, normal  heart sounds. Exam reveals no gallop and no friction rub. No murmur heard.  Pulmonary/Chest: Effort normal and breath sounds normal. No stridor. No respiratory distress. She has no wheezes. She has no rales. She exhibits no tenderness.  Abdominal: Soft. Bowel sounds are normal. She exhibits no distension. There is no tenderness. There is no rebound and no guarding.  Musculoskeletal: Normal range of motion. She exhibits no edema and no tenderness.  Neurological: She is alert and oriented to person, place, and time. Coordination normal.  Skin: Skin is warm and dry. No rash noted. She is not diaphoretic. No erythema. No pallor.  Psychiatric: She has a normal mood and affect. Her behavior is normal. Judgment and thought content normal.    EKG: Normal sinus rhythm with no significant ST or T wave changes.  ASSESSMENT AND PLAN

## 2014-04-12 NOTE — Assessment & Plan Note (Signed)
I asked her to continue monitoring her blood pressure at home 3 times a week and consider treatment if blood pressure continues to be high.

## 2014-04-12 NOTE — Patient Instructions (Signed)
Follow up as needed

## 2014-04-29 ENCOUNTER — Ambulatory Visit (INDEPENDENT_AMBULATORY_CARE_PROVIDER_SITE_OTHER): Payer: 59 | Admitting: Obstetrics & Gynecology

## 2014-04-29 ENCOUNTER — Encounter: Payer: Self-pay | Admitting: Obstetrics & Gynecology

## 2014-04-29 VITALS — BP 120/74 | HR 69 | Ht 60.0 in | Wt 125.0 lb

## 2014-04-29 DIAGNOSIS — K5900 Constipation, unspecified: Secondary | ICD-10-CM

## 2014-04-29 NOTE — Progress Notes (Signed)
   Subjective:    Patient ID: Connie Francis, female    DOB: 1970-10-06, 44 y.o.   MRN: [id number]  HPI 44 yo MW P2 (91 and 15 yo kids) here today to discuss some constipation (has IBS) and some difficulty defecating. She also feels the urge to poop when having sex. She takes Senna S for her IBS with constipation. Miralax didn't help. She has a BM about once per week. It's been a year since she saw her GI. She had a colonoscopy in 2014.   Review of Systems Mammogram is UTD She had a hysterectomy. She had a 4th degree tear with her vaginal delivery. She denies fecal incontinence.    Objective:   Physical Exam [id number] Ambulating and breathing normally No laxity of vaginal vault Normal rectal, good tone Normal bimanual exam, no masses      Assessment & Plan:  Constipation and difficulty defecating- rec increase water, try miralax TID Rec see GI

## 2014-04-29 NOTE — Progress Notes (Signed)
   Subjective:    Patient ID: Connie Francis, female    DOB: [birth year], 44 y.o.   MRN: [id number]  HPI 44 yo MW P2 (67 and 69 yo kids) here today to discuss some constipation (has IBS) and some difficulty defecating. She also feels the urge to poop when having sex. She takes Senna S for her IBS with constipation. Miralax didn't help. She has a BM about once per week. It's been a year since she saw her GI. She had a colonoscopy in 2014.   Review of Systems Mammogram is UTD She had a hysterectomy. She had a 4th degree tear with her vaginal delivery. She denies fecal incontinence.    Objective:   Physical Exam        Assessment & Plan:

## 2014-05-03 NOTE — Discharge Summary (Signed)
PATIENT NAME:  Connie Francis, SCADDEN MR#:  [id number] DATE OF BIRTH:  08/30/1970  DATE OF ADMISSION:  10/22/2011 DATE OF DISCHARGE:  10/23/2011  DIAGNOSES:  1. Right facial numbness, right upper extremity numbness.  2. Hypothyroidism.  3. Anxiety and depression.   DISPOSITION: The patient is being discharged home.   DIET: Regular.   ACTIVITY: As tolerated.   FOLLOWUP: Follow up with Dr. Derrel Nip 1 to 2 weeks after discharge.   DISCHARGE MEDICATIONS: Xanax 0.25 mg, 0.5 tablet twice a day as needed for anxiety.   LABORATORY, DIAGNOSTIC, AND RADIOLOGICAL DATA: CT of the head showed no acute abnormality. MRI of the brain showed no acute abnormality.  Urine culture: 10,000 CFU gram-positive cocci. CBC normal. Complete metabolic panel normal. TSH. 0.403.   HOSPITAL COURSE: The patient is a 44 year old female with past medical history of anxiety, depression, migraines, hypothyroidism not on any treatment who presented with right facial numbness and right upper extremity numbness.  Etiology was unclear but it resolved spontaneously. Her CT and MRI brain showed no evidence of any acute cerebrovascular accident. The patient reports that she was started on Zoloft two days ago and since then has been having abnormal sensations and feelings including numbness in her upper extremities, chest pressure, and weakness. Therefore the patient has been advised to discontinue the Zoloft and addressed this with Dr. Derrel Nip. There was a possibility of urinary tract infection, but her urinalysis shows no bacteria and urine culture is growing 10,000 CFU gram-positive cocci. The patient has no symptoms of urinary tract infection; therefore she is not being treated with any antibiotics. The patient reports that she has hypothyroidism but her last TSH was normal. She has followed up with Dr. Gabriel Carina in the past. Today her TSH was slightly low. The patient has been advised to reconsult Dr. Gabriel Carina as an outpatient. The patient is asymptomatic  and stable at the time of discharge.   TIME SPENT: 45 minutes.     ____________________________ Cherre Huger, MD [id number] D: 10/23/2011 14:05:39 ET T: 10/23/2011 14:21:24 ET JOB#: [id number]  cc: Cherre Huger, MD, <Dictator> Deborra Medina, MD Cherre Huger MD ELECTRONICALLY SIGNED 10/24/2011 11:11

## 2014-05-03 NOTE — H&P (Signed)
PATIENT NAME:  Connie Francis, SUTPHIN MR#:  [id number] DATE OF BIRTH:  [birth year]  DATE OF ADMISSION:  10/22/2011  PRIMARY CARE PHYSICIAN: Deborra Medina, MD   ER REFERRING PHYSICIAN: Conni Slipper, MD   CHIEF COMPLAINT: Right side facial numbness for three days.   HISTORY OF PRESENT ILLNESS: The patient is a 44 year old Caucasian female with a history of congestive heart failure, hyperthyroidism, migraine, anxiety, who presented to the Emergency Department  with right  facial numbness for three days. The patient got a CAT scan of head three days ago which was negative. She went to see her primary care physician, Dr. Derrel Nip, was given Zoloft but still felt right facial numbness. Pt said the numbness was better while Dr. Cinda Quest examed her. The patient denies any headache, dizziness. No fever, chills, cough, phlegm. No chest pain, palpitation, orthopnea, nocturnal dyspnea. 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 She has a lot of stress, but the patient denies any depression. Dr. Cinda Quest admitted pt for TIA.  SOCIAL HISTORY: No smoking, alcohol drinking, or illicit drugs.   FAMILY HISTORY: No hypertension, diabetes, heart attack, or stroke.   ALLERGIES: Penicillin, promethazine, sulfa drugs, shellfish, latex.   MEDICATIONS:  1. Zoloft 25 mg p.o. daily.  2. Alprazolam 0.25 mg, 1/2 tablet twice a day p.r.n.   PAST SURGICAL HISTORY:  1. C-section. 2. Hysterectomy. 3. Ureteroscopy.   REVIEW OF SYSTEMS: CONSTITUTIONAL: The patient denies any fever or chills. No headache or dizziness. No weakness but has facial numbness. EYES: No double vision or blurred vision. ENT: No epistaxis, postnasal drip, or slurred speech. CARDIOVASCULAR: No chest pain, palpitations, orthopnea, no nocturnal dyspnea. PULMONARY: No cough, sputum, shortness of breath, or hematemesis. GASTROINTESTINAL: No abdominal pain, nausea, vomiting, or diarrhea. No melena or bloody stools.  GENITOURINARY: No dysuria or hematuria or incontinence. SKIN: No rash or jaundice. NEUROLOGICAL: No syncope, loss of consciousness or seizure. No focal weakness but has right-sided facial numbness.   PHYSICAL EXAMINATION:  VITAL SIGNS: Temperature 99.6, blood pressure 137/73, pulse 76, respirations 20, oxygen saturation 98% on room air.   GENERAL: The patient is alert, awake, oriented, in no acute distress.   HEENT: Pupils are round, equal, reactive to light and accommodation.   NECK: Supple. No JVD or carotid bruits. No lymphadenopathy. No thyromegaly.   CARDIOVASCULAR: S1, S2 regular rate, rhythm. No murmurs, rubs or gallops.   PULMONARY: Bilateral air entry. No wheezing or rales. No use of accessory muscles to breathe.   ABDOMEN: Soft. No distention or tenderness. No organomegaly. Bowel sounds present.   EXTREMITIES: No edema, clubbing, or cyanosis. No calf tenderness.   SKIN: No rash or jaundice.   NEUROLOGY: Alert and oriented x3. No focal deficit. Power 5/5. Sensation intact. Deep tendon reflexes 2+.   LABORATORY, DIAGNOSTIC AND RADIOLOGICAL DATA: CAT scan of the head today showed no acute intracranial abnormality. Glucose 100, BUN 10, creatinine 0.67. Electrolytes are normal. WBC 7.9, hemoglobin 12.8, platelets 265, magnesium 2.0. TSH 0.403. Urinalysis shows RBC 2, WBC 19. EKG showed normal sinus rhythm at 76 beats per minute.   IMPRESSION:  1. Right facial numbness, questionable transient ischemic attack, need to rule out cerebrovascular accident.  2. Urinary tract infection.  3. History of congestive heart failure. 4. Hyperthyroidism. 5. Migraine. 6. Anxiety.   PLAN OF TREATMENT:  1. The patient will be placed for observation. We will start telemetry monitor. We will give aspirin 325 mg p.o. daily. Start Zocor 40 mg  p.o. at bedtime. We will get an MRI of the brain to rule out CVA.  2. We will not check a lipid panel since the patient said that her lipid panel is normal  recently. In addition, the patient got an echocardiogram recently, but the patient does not know the results.  3. For urinary tract infection, we will start Cipro and follow up urine culture.  4. GI and deep vein thrombosis prophylaxis.   I discussed the patient's situation and plan of treatment with the patient and her mother.   TIME SPENT: About 50 minutes.   ____________________________ Demetrios Loll, MD qc:cbb D: 10/22/2011 18:04:40 ET T: 10/22/2011 18:29:28 ET JOB#: [id number]  cc: Demetrios Loll, MD, <Dictator> Deborra Medina, MD Demetrios Loll MD ELECTRONICALLY SIGNED 10/22/2011 20:40

## 2014-05-07 NOTE — Consult Note (Signed)
PATIENT NAME:  Connie Francis, Connie Francis MR#:  [id number] DATE OF BIRTH:  Mar 15, 1970  DATE OF CONSULTATION:  02/23/2013  REFERRING PHYSICIAN:  Francene Castle, MD CONSULTING PHYSICIAN:  Cordelia Pen. Gretel Acre, MD  REASON FOR CONSULTATION: Depression and anxiety.   HISTORY OF PRESENT ILLNESS: The patient is a 44 year old married female who was evaluated in the presence of her mother in the ED. The patient presented to the ED as she was having severe anxiety as well as having depressive symptoms. The patient reported that she is currently following with Dr. Nicolasa Ducking as an outpatient psychiatrist. She reported that she has finished an outpatient course of prednisone which was prescribed by her primary care physician, Dr. Derrel Nip, approximately 2 weeks ago. The patient reported since then she started having severe anxiety and she was unable to swallow. She saw ENT who did swallowing study. She came to the ER at [facility] and evaluated over here. She stated that she started having numbness on the right side of her face and was evaluated by the neurologist, Dr. Melrose Nakayama. He finished the work up on her and then MRI was done last Friday. It came back normal. The patient stated that she started feeling better last Saturday and was eating okay. However, she started having severe cough and passed out. She took some Xanax 0.25 mg 1/2 pill which was prescribed by Dr. Nicolasa Ducking to control her anxiety. When she went to church on Sunday morning she was not feeling well and she was coughing again. She passed out on Sunday and was taken to [facility] by her husband. She was admitted over there and was discharged yesterday. She reported that after she came home she was taking a shower and she sneezed and she some rice coming out of her nose. She reported that she ate some [place] food 2 days ago. She freaked out and she was unable to sleep the whole night last night. She was having palpitations with her heart rate in the 100s. She was having full panic attacks and  she was feeling sad and depressed. She reported that she feels that her symptoms are not getting better. She reported that she asked her husband to bring her to the hospital this morning. The patient reported that she is getting Zoloft 25 mg prescribed by Dr. Nicolasa Ducking for her depression as well as anxiety symptoms. She is also getting some nortriptyline to help with her migraines as well as Xanax on a p.r.n. basis. However, she takes Xanax seldom. She currently denied having any suicidal ideations or plans.   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 She was initially working in the [employer] but later she is not feeling that she has plenty of work over here and she feels that she needs more work. She is trying to find another job.   PAST PSYCHIATRIC HISTORY: The patient reported history of anxiety and depression and has been following with Dr. Nicolasa Ducking for a long period of time. She has been tried on several psychotropic medications including Zoloft, Xanax, nortriptyline, Celexa, Cymbalta, Effexor, Remeron, Prozac, Paxil, and Lexapro in the past. She did very on BuSpar in the past after the death of her father, but she stopped taking the BuSpar because she felt that she was cured. However, she feels that now the medications are not helping her and she is not improving. She denied any history of suicidal ideations or attempts.   PAST PSYCHIATRIC HISTORY: Anxiety in  the past.   PAST MEDICAL HISTORY: The patient has history of headaches as well as anxiety, irritable bowel syndrome, kidney stones, hypothyroidism, anemia, tubal ligation, C-section, hysterectomy, ureteroscopy, and removal of nasal cyst. She recently had MRI of brain done.   ALLERGIES: PROMETHAZINE, SULFA DRUGS, HALDOL, SHELLFISH, AND PENICILLIN.   CURRENT MEDICATIONS: Zoloft 20 mg daily, Xanax 0.25 mg t.i.d. p.r.n., Protonix 40 mg p.o. daily, nortriptyline 10 mg once a day.   SOCIAL  HISTORY: The patient is currently married and lives with her husband. She has 2 children, 16 and 21 years old. She is currently employed as a Careers information officer. She enjoys her job.   ANCILLARY DATA: Temperature 98.3, pulse 70, respirations 18, blood pressure 138/76.   LABORATORY DATA: Glucose 91.   MENTAL STATUS EXAMINATION: The patient is a moderately built female who appeared her stated age. She was very anxious and was lying in the bed. She maintained fair eye contact. Her speech was low in tone and volume. Mood was depressed and anxious. Affect was congruent. Thought process was logical, goal-directed. Thought content was nondelusional. She currently denied having any suicidal or homicidal ideations or plans. She demonstrated fair insight and judgment. She was able to provide a coherent history. Her language was fine. Fund of knowledge was appropriate. No perceptual disturbances were noted. Her memory was intact.   REVIEW OF SYSTEMS: CONSTITUTIONAL: Denies any fever or chills. No weakness noted.  EYES: No double or blurred vision.  ENT: No tinnitus or ear pain.  RESPIRATORY: No cough or wheezing.  CARDIOVASCULAR: No chest pain or orthopnea. GASTROINTESTINAL: No nausea, vomiting, or diarrhea noted. GENITOURINARY: No dysuria or hematuria noted.  ENDOCRINE: No polyuria or nocturia noted.  INTEGUMENTARY: No acne or rash.  NEUROLOGICAL: No numbness or weakness.   DIAGNOSTIC IMPRESSION: AXIS I: Panic disorder without agoraphobia.  AXIS II: None.  AXIS III: Please review the medical history.   TREATMENT PLAN: The patient will be provided with Seroquel 25 mg 1/2 pill p.o. b.i.d.   She will continue on Zoloft 25 mg p.o. daily. Discussed her case with Dr. Jacqualine Code and he agreed with the plan. The patient will follow up with Dr. Nicolasa Ducking outpatient. She can be discharged when she is medically stable.   Thank you for allowing me to participate in the care of this patient.    ____________________________ Cordelia Pen. Gretel Acre, MD usf:sb D: 02/23/2013 14:59:52 ET T: 02/23/2013 15:31:23 ET JOB#: [id number]  cc: Cordelia Pen. Gretel Acre, MD, <Dictator> Jeronimo Norma MD ELECTRONICALLY SIGNED 03/01/2013 9:06

## 2014-07-15 ENCOUNTER — Ambulatory Visit (INDEPENDENT_AMBULATORY_CARE_PROVIDER_SITE_OTHER): Payer: 59 | Admitting: Internal Medicine

## 2014-07-15 ENCOUNTER — Encounter: Payer: Self-pay | Admitting: Internal Medicine

## 2014-07-15 VITALS — BP 119/76 | HR 65 | Temp 98.1°F | Ht 61.5 in | Wt 125.4 lb

## 2014-07-15 DIAGNOSIS — Z0000 Encounter for general adult medical examination without abnormal findings: Secondary | ICD-10-CM

## 2014-07-15 DIAGNOSIS — R945 Abnormal results of liver function studies: Secondary | ICD-10-CM

## 2014-07-15 DIAGNOSIS — K589 Irritable bowel syndrome without diarrhea: Secondary | ICD-10-CM

## 2014-07-15 DIAGNOSIS — Z90710 Acquired absence of both cervix and uterus: Secondary | ICD-10-CM | POA: Diagnosis not present

## 2014-07-15 DIAGNOSIS — K581 Irritable bowel syndrome with constipation: Secondary | ICD-10-CM

## 2014-07-15 DIAGNOSIS — E785 Hyperlipidemia, unspecified: Secondary | ICD-10-CM

## 2014-07-15 DIAGNOSIS — R7989 Other specified abnormal findings of blood chemistry: Secondary | ICD-10-CM | POA: Diagnosis not present

## 2014-07-15 LAB — LIPID PANEL
CHOL/HDL RATIO: 3
Cholesterol: 179 mg/dL (ref 0–200)
HDL: 53.8 mg/dL (ref >39.00)
LDL CALC: 106 mg/dL — AB (ref 0–99)
NONHDL: 125.2
Triglycerides: 95 mg/dL (ref 0.0–149.0)
VLDL: 19 mg/dL (ref 0.0–40.0)

## 2014-07-15 LAB — HEPATIC FUNCTION PANEL
ALT: 12 U/L (ref 0–35)
AST: 17 U/L (ref 0–37)
Albumin: 4.4 g/dL (ref 3.5–5.2)
Alkaline Phosphatase: 68 U/L (ref 39–117)
BILIRUBIN DIRECT: 0.1 mg/dL (ref 0.0–0.3)
BILIRUBIN TOTAL: 0.4 mg/dL (ref 0.2–1.2)
TOTAL PROTEIN: 7.6 g/dL (ref 6.0–8.3)

## 2014-07-15 NOTE — Progress Notes (Signed)
Pre visit review using our clinic review tool, if applicable. No additional management support is needed unless otherwise documented below in the visit note. 

## 2014-07-15 NOTE — Progress Notes (Signed)
Patient ID: Connie Francis, female    DOB: 04-05-1970  Age: 44 y.o. MRN: [id number]  The patient is here for annual  wellness examination and management of other chronic and acute problems.  Recurrent constipation and bloating despite using miralax and a prescribed  probiotic daily,   Lately feels liek she is having a lot of trapped gas.  Says it starts before she gets hungry.  Was tested for celiac disease    The risk factors are reflected in the social history.  The roster of all physicians providing medical care to patient - is listed in the Snapshot section of the chart.  Activities of daily living:  The patient is 100% independent in all ADLs: dressing, toileting, feeding as well as independent mobility  Home safety : The patient has smoke detectors in the home. They wear seatbelts.  There are no firearms at home. There is no violence in the home.   There is no risks for hepatitis, STDs or HIV. There is no   history of blood transfusion. They have no travel history to infectious disease endemic areas of the world.  The patient has seen their dentist in the last six month. They have seen their eye doctor in the last year. They admit to slight hearing difficulty with regard to whispered voices and some television programs.  They have deferred audiologic testing in the last year.  They do not  have excessive sun exposure. Discussed the need for sun protection: hats, long sleeves and use of sunscreen if there is significant sun exposure.   Diet: the importance of a healthy diet is discussed. They do have a healthy diet.  The benefits of regular aerobic exercise were discussed. She walks 4 times per week ,  20 minutes.   Depression screen: there are no signs or vegative symptoms of depression- irritability, change in appetite, anhedonia, sadness/tearfullness.  Cognitive assessment: the patient manages all their financial and personal affairs and is actively engaged. They could relate  day,date,year and events; recalled 2/3 objects at 3 minutes; performed clock-face test normally.  The following portions of the patient's history were reviewed and updated as appropriate: allergies, current medications, past family history, past medical history,  past surgical history, past social history  and problem list.  Visual acuity was not assessed per patient preference since she has regular follow up with her ophthalmologist. Hearing and body mass index were assessed and reviewed.   During the course of the visit the patient was educated and counseled about appropriate screening and preventive services including : fall prevention , diabetes screening, nutrition counseling, colorectal cancer screening, and recommended immunizations.    CC: The primary encounter diagnosis was S/P total hysterectomy. Diagnoses of Elevated liver function tests, Hyperlipidemia, IBS (irritable bowel syndrome), Irritable bowel syndrome with constipation, and Routine general medical examination at a health care facility were also pertinent to this visit.  History Connie Francis has a past medical history of Obsessive compulsive disorder; Breast cyst; Kidney stones; Thyroid disease; CHF (congestive heart failure); Lump or mass in breast; Anxiety; IBS (irritable bowel syndrome); and GERD (gastroesophageal reflux disease).   She has past surgical history that includes Dilation and curettage of uterus (2012); Abdominal hysterectomy (2012); Cesarean section; Upper endoscopy w/ esophageal manometry; and Colonoscopy (2014).   Her family history includes Alcohol abuse in her maternal grandmother; Arthritis in her maternal grandfather; Cancer in her maternal aunt and paternal uncle; Hearing loss in her maternal grandmother; Stroke in her paternal aunt.She reports that she  has never smoked. She has never used smokeless tobacco. She reports that she does not drink alcohol or use illicit drugs.  Outpatient Prescriptions Prior to Visit   Medication Sig Dispense Refill  . ALPRAZolam (XANAX) 0.25 MG tablet Take 0.25 mg by mouth 2 (two) times daily as needed for anxiety.    . citalopram (CELEXA) 20 MG tablet Take 10 mg by mouth daily.   2  . ibuprofen (ADVIL,MOTRIN) 600 MG tablet Take 600 mg by mouth every 6 (six) hours as needed.    . sertraline (ZOLOFT) 50 MG tablet Take 50 mg by mouth daily.    Marland Kitchen albuterol (PROVENTIL HFA;VENTOLIN HFA) 108 (90 BASE) MCG/ACT inhaler Inhale 2 puffs into the lungs every 6 (six) hours as needed for wheezing or shortness of breath. 1 Inhaler 0  . fluticasone (FLOVENT HFA) 220 MCG/ACT inhaler Inhale 2 puffs into the lungs 2 (two) times daily. 1 Inhaler 12  . Spacer/Aero-Holding Chambers (AEROCHAMBER MV) inhaler Use as instructed 1 each 2   No facility-administered medications prior to visit.    Review of Systems   Patient denies headache, fevers, malaise, unintentional weight loss, skin rash, eye pain, sinus congestion and sinus pain, sore throat, dysphagia,  hemoptysis , cough, dyspnea, wheezing, chest pain, palpitations, orthopnea, edema, abdominal pain, nausea, melena, diarrhea, constipation, flank pain, dysuria, hematuria, urinary  Frequency, nocturia, numbness, tingling, seizures,  Focal weakness, Loss of consciousness,  Tremor, insomnia, depression, anxiety, and suicidal ideation.      Objective:  BP 119/76 mmHg  Pulse 65  Temp(Src) 98.1 F (36.7 C) (Oral)  Ht 5' 1.5" (1.562 m)  Wt 125 lb 6 oz (56.87 kg)  BMI 23.31 kg/m2  SpO2 99%  Physical Exam   General appearance: alert, cooperative and appears stated age Ears: normal TM's and external ear canals both ears Throat: lips, mucosa, and tongue normal; teeth and gums normal Neck: no adenopathy, no carotid bruit, supple, symmetrical, trachea midline and thyroid not enlarged, symmetric, no tenderness/mass/nodules Back: symmetric, no curvature. ROM normal. No CVA tenderness. Lungs: clear to auscultation bilaterally Heart: regular rate  and rhythm, S1, S2 normal, no murmur, click, rub or gallop Abdomen: soft, non-tender; bowel sounds normal; no masses,  no organomegaly Pulses: 2+ and symmetric Skin: Skin color, texture, turgor normal. No rashes or lesions Lymph nodes: Cervical, supraclavicular, and axillary nodes normal.    Assessment & Plan:   Problem List Items Addressed This Visit      Unprioritized   Irritable bowel syndrome with constipation    Discussed dietary habits and use of laxatives.  Dietary recommendations made..      Routine general medical examination at a health care facility    Annual wellness  exam was done as well as a comprehensive physical exam and management of acute and chronic conditions .  During the course of the visit the patient was educated and counseled about appropriate screening and preventive services including :  diabetes screening, lipid analysis with projected  10 year  risk for CAD , nutrition counseling, colorectal cancer screening, and recommended immunizations.  Printed recommendations for health maintenance screenings was given.       IBS (irritable bowel syndrome)   S/P total hysterectomy - Primary   Elevated liver function tests   Relevant Orders   Hepatic function panel (Completed)   ANA   Anti-Smith antibody   Ferritin   Iron and TIBC   Hepatitis C antibody    Other Visit Diagnoses    Hyperlipidemia  Relevant Orders    Lipid panel (Completed)       I have discontinued Ms. Economou's albuterol, fluticasone, and AEROCHAMBER MV. I am also having her maintain her ALPRAZolam, sertraline, ibuprofen, and citalopram.  No orders of the defined types were placed in this encounter.    Medications Discontinued During This Encounter  Medication Reason  . albuterol (PROVENTIL HFA;VENTOLIN HFA) 108 (90 BASE) MCG/ACT inhaler Patient Preference  . fluticasone (FLOVENT HFA) 220 MCG/ACT inhaler Patient Preference  . Spacer/Aero-Holding Chambers (AEROCHAMBER MV) inhaler  Patient Preference    Follow-up: No Follow-up on file.   Crecencio Mc, MD

## 2014-07-15 NOTE — Patient Instructions (Signed)
I recommend you simplify your diet and get rid of all processed foods.  The diet is very well explained in the the following book that you can buy on [employer] 10 day [employer]  You do NOT [place],  You will still see  A BENEFIT WITH 2 MEALS DAILY

## 2014-07-17 DIAGNOSIS — K589 Irritable bowel syndrome without diarrhea: Secondary | ICD-10-CM | POA: Insufficient documentation

## 2014-07-17 NOTE — Assessment & Plan Note (Signed)

## 2014-07-17 NOTE — Assessment & Plan Note (Signed)
Discussed dietary habits and use of laxatives.  Dietary recommendations made.Marland Kitchen

## 2014-07-18 ENCOUNTER — Encounter: Payer: Self-pay | Admitting: Internal Medicine

## 2014-07-20 ENCOUNTER — Encounter: Payer: Self-pay | Admitting: Urgent Care

## 2014-07-20 ENCOUNTER — Ambulatory Visit (INDEPENDENT_AMBULATORY_CARE_PROVIDER_SITE_OTHER): Payer: 59 | Admitting: Urgent Care

## 2014-07-20 VITALS — BP 138/82 | HR 73 | Temp 98.9°F | Ht 61.5 in | Wt 124.2 lb

## 2014-07-20 DIAGNOSIS — K589 Irritable bowel syndrome without diarrhea: Secondary | ICD-10-CM | POA: Diagnosis not present

## 2014-07-20 DIAGNOSIS — R1012 Left upper quadrant pain: Secondary | ICD-10-CM | POA: Diagnosis not present

## 2014-07-20 DIAGNOSIS — R1032 Left lower quadrant pain: Secondary | ICD-10-CM | POA: Diagnosis not present

## 2014-07-20 MED ORDER — RANITIDINE HCL 150 MG PO TABS: 150.0000 mg | ORAL_TABLET | Freq: Two times a day (BID) | ORAL | Status: DC

## 2014-07-20 NOTE — Assessment & Plan Note (Signed)
Constipation much improved with probiotics and antispasmotic

## 2014-07-20 NOTE — Assessment & Plan Note (Addendum)
Patient has significant upper abdominal pain mostly left upper quadrant does radiate to left lower quadrant and to the midline. She is quite tender on exam. CT scan as soon as possible to look for diverticulitis, colitis, pancreatitis, ovarian etiology, or renal lithiasis.  Abd pain literature given To ER if severe pain We will need EGD and colonoscopy with Dr. Allen Norris if symptoms do not resolve or are not explained with CT findings

## 2014-07-20 NOTE — Patient Instructions (Signed)
Begin zantac 150mg  once or twice daily Continue probiotic daily Abdominal Pain, Women Abdominal (stomach, pelvic, or belly) pain can be caused by many things. It is important to tell your doctor:  The location of the pain.  Does it come and go or is it present all the time?  Are there things that start the pain (eating certain foods, exercise)?  Are there other symptoms associated with the pain (fever, nausea, vomiting, diarrhea)? All of this is helpful to know when trying to find the cause of the pain. CAUSES   Stomach: virus or bacteria infection, or ulcer.  Intestine: appendicitis (inflamed appendix), regional ileitis (Crohn's disease), ulcerative colitis (inflamed colon), irritable bowel syndrome, diverticulitis (inflamed diverticulum of the colon), or cancer of the stomach or intestine.  Gallbladder disease or stones in the gallbladder.  Kidney disease, kidney stones, or infection.  Pancreas infection or cancer.  Fibromyalgia (pain disorder).  Diseases of the female organs:  Uterus: fibroid (non-cancerous) tumors or infection.  Fallopian tubes: infection or tubal pregnancy.  Ovary: cysts or tumors.  Pelvic adhesions (scar tissue).  Endometriosis (uterus lining tissue growing in the pelvis and on the pelvic organs).  Pelvic congestion syndrome (female organs filling up with blood just before the menstrual period).  Pain with the menstrual period.  Pain with ovulation (producing an egg).  Pain with an IUD (intrauterine device, birth control) in the uterus.  Cancer of the female organs.  Functional pain (pain not caused by a disease, may improve without treatment).  Psychological pain.  Depression. DIAGNOSIS  Your doctor will decide the seriousness of your pain by doing an examination.  Blood tests.  X-rays.  Ultrasound.  CT scan (computed tomography, special type of X-ray).  MRI (magnetic resonance imaging).  Cultures, for infection.  Barium  enema (dye inserted in the large intestine, to better view it with X-rays).  Colonoscopy (looking in intestine with a lighted tube).  Laparoscopy (minor surgery, looking in abdomen with a lighted tube).  Major abdominal exploratory surgery (looking in abdomen with a large incision). TREATMENT  The treatment will depend on the cause of the pain.   Many cases can be observed and treated at home.  Over-the-counter medicines recommended by your caregiver.  Prescription medicine.  Antibiotics, for infection.  Birth control pills, for painful periods or for ovulation pain.  Hormone treatment, for endometriosis.  Nerve blocking injections.  Physical therapy.  Antidepressants.  Counseling with a psychologist or psychiatrist.  Minor or major surgery. HOME CARE INSTRUCTIONS   Do not take laxatives, unless directed by your caregiver.  Take over-the-counter pain medicine only if ordered by your caregiver. Do not take aspirin because it can cause an upset stomach or bleeding.  Try a clear liquid diet (broth or water) as ordered by your caregiver. Slowly move to a bland diet, as tolerated, if the pain is related to the stomach or intestine.  Have a thermometer and take your temperature several times a day, and record it.  Bed rest and sleep, if it helps the pain.  Avoid sexual intercourse, if it causes pain.  Avoid stressful situations.  Keep your follow-up appointments and tests, as your caregiver orders.  If the pain does not go away with medicine or surgery, you may try:  Acupuncture.  Relaxation exercises (yoga, meditation).  Group therapy.  Counseling. SEEK MEDICAL CARE IF:   You notice certain foods cause stomach pain.  Your home care treatment is not helping your pain.  You need stronger pain medicine.  You want your IUD removed.  You feel faint or lightheaded.  You develop nausea and vomiting.  You develop a rash.  You are having side effects or an  allergy to your medicine. SEEK IMMEDIATE MEDICAL CARE IF:   Your pain does not go away or gets worse.  You have a fever.  Your pain is felt only in portions of the abdomen. The right side could possibly be appendicitis. The left lower portion of the abdomen could be colitis or diverticulitis.  You are passing blood in your stools (bright red or black tarry stools, with or without vomiting).  You have blood in your urine.  You develop chills, with or without a fever.  You pass out. MAKE SURE YOU:   Understand these instructions.  Will watch your condition.  Will get help right away if you are not doing well or get worse. Document Released: 10/28/2006 Document Revised: 05/17/2013 Document Reviewed: 11/17/2008 [facility] Patient Information 2015 [place], [place]. This information is not intended to replace advice given to you by your health care provider. Make sure you discuss any questions you have with your health care provider.

## 2014-07-20 NOTE — Progress Notes (Signed)
Primary Care Physician: Crecencio Mc, MD Primary Gastroenterologist:  Dr Allen Norris  Chief Complaint  Patient presents with  . Abdominal Pain    Generalized  . Fatigue  . Bloated    HPI: Connie Francis is a 44 y.o. female here for follow up of abdominal pain & IBS.  Pt was seen 2 months ago. She has tried probiotics, Bentyl with minimal relief. It has helped with her constipation. He has had severe abdominal pain. She describes as a "fullness in her upper abdomen". Pain radiates to her lower abdomen and suprapubic area. She feels feverish with hot and cold flashes.  Pain is 8/10 on pain scale. It is not associated with eating. It is better with lying supine. She has started a multivitamin as she was having significant leg cramps while at [employer]. She feels that she may have been doing more walking than usual. She feels her anxiety is well controlled. She has noticed increased belching. Her symptoms do not wake her from sleeping. She has tried Prilosec 20 mg and Protonix 40 mg daily which did not seem to per symptoms much and actually she feels as though he may have made it worse. She denies any NSAIDs. She does take extra strength Tylenol once or twice a day. She is now having a couple nonbloody bowel movements per day. Comes seems to help some with her upper abdominal symptoms. She had a colonoscopy in 2014 by Dr. Candace Cruise that showed sigmoid diverticulosis and EGD in 2013 by Dr. Candace Cruise that was normal and she was empirically dilated. She hasn't had an ultrasound and HIDA scan were normal last year and she was seen by surgeon Dr. Jamal Collin.  Current Outpatient Prescriptions  Medication Sig Dispense Refill  . ALPRAZolam (XANAX) 0.25 MG tablet Take 0.25 mg by mouth 2 (two) times daily as needed for anxiety.    . citalopram (CELEXA) 20 MG tablet Take 10 mg by mouth daily.   2  . dicyclomine (BENTYL) 10 MG capsule Take 10 mg by mouth 4 (four) times daily -  before meals and at bedtime.    Marland Kitchen ibuprofen  (ADVIL,MOTRIN) 600 MG tablet Take 600 mg by mouth every 6 (six) hours as needed.    . Probiotic Product (VSL#3 PO) Take 1 capsule by mouth 2 (two) times daily.    . sertraline (ZOLOFT) 50 MG tablet Take 50 mg by mouth daily.    . ranitidine (ZANTAC) 150 MG tablet Take 1 tablet (150 mg total) by mouth 2 (two) times daily. 60 tablet 2   No current facility-administered medications for this visit.    Allergies as of 07/20/2014 - Review Complete 07/20/2014  Allergen Reaction Noted  . Latex  09/13/2010  . Penicillins  09/13/2010  . Promethazine  11/05/2011  . Shellfish allergy  09/13/2010  . Sulfa antibiotics  09/13/2010  . Lamictal [lamotrigine] Rash 07/18/2013    Review of Systems: Gen: Denies any fever, chills, fatigue, weakness, malaise ENT: Negative for hoarseness, difficulty swallowing , nasal congestion CV: Denies chest pain, angina, palpitations, syncope, orthopnea, PND, peripheral edema, and claudication. Resp: Denies dyspnea at rest, dyspnea with exercise, cough, sputum, wheezing, coughing up blood, and pleurisy. GI: See HPI GU:  Negative for dysuria, hematuria, urinary incontinence, urinary frequency, nocturnal urination.  Endo: Negative for unusual weight change or sweats Derm: Denies jaundice, rash, itching, or unhealing ulcers.  Psych: Denies depression, anxiety, memory loss, suicidal ideation, hallucinations, paranoia, and confusion. Heme: Denies bruising, bleeding, and enlarged lymph nodes.  Physical Examination:  BP 138/82 mmHg  Pulse 73  Temp(Src) 98.9 F (37.2 C) (Oral)  Ht 5' 1.5" (1.562 m)  Wt 124 lb 3.2 oz (56.337 kg)  BMI 23.09 kg/m2 Body mass index is 23.09 kg/(m^2). No LMP recorded. Patient has had a hysterectomy. General:   Alert,  Well-developed, well-nourished, pleasant and cooperative in NAD Head:  Normocephalic and atraumatic. Eyes:  Sclera clear, no icterus.   Conjunctiva pink. Mouth:  No deformity or lesions.  Oropharynx pink & moist. Neck:   Supple; no masses or thyromegaly. Heart:  Regular rate and rhythm; no murmurs, clicks, rubs,  or gallops. Abdomen:   Normal bowel sounds.  Soft, nondistended. + Significant tenderness to palpation of left upper quadrant and left lower quadrant to the umbilicus. No masses, hepatosplenomegaly or hernias noted. No guarding.  +Rebound tenderness.  +Carnett sign with tenderness to left flank. Msk:  Symmetrical without gross deformities. Normal posture. Pulses:  Normal pulses noted. Extremities:  Without clubbing or edema. Neurologic:  Alert and  oriented x3;  grossly normal neurologically. Skin:  Intact without significant lesions or rashes. Cervical Nodes:  No significant cervical adenopathy. Psych:  Alert and cooperative. + tearful during exam.

## 2014-07-21 ENCOUNTER — Other Ambulatory Visit: Payer: 59 | Admitting: *Deleted

## 2014-07-21 ENCOUNTER — Ambulatory Visit
Admission: RE | Admit: 2014-07-21 | Discharge: 2014-07-21 | Disposition: A | Discharge status: Home / Self Care | Payer: 59 | Source: Ambulatory Visit | Attending: Urgent Care | Admitting: Urgent Care

## 2014-07-21 ENCOUNTER — Telehealth: Payer: Self-pay

## 2014-07-21 DIAGNOSIS — N281 Cyst of kidney, acquired: Secondary | ICD-10-CM | POA: Diagnosis not present

## 2014-07-21 DIAGNOSIS — R1012 Left upper quadrant pain: Secondary | ICD-10-CM | POA: Diagnosis present

## 2014-07-21 DIAGNOSIS — Z90710 Acquired absence of both cervix and uterus: Secondary | ICD-10-CM | POA: Diagnosis not present

## 2014-07-21 DIAGNOSIS — K5730 Diverticulosis of large intestine without perforation or abscess without bleeding: Secondary | ICD-10-CM | POA: Diagnosis not present

## 2014-07-21 DIAGNOSIS — R1032 Left lower quadrant pain: Secondary | ICD-10-CM | POA: Diagnosis present

## 2014-07-21 DIAGNOSIS — R319 Hematuria, unspecified: Secondary | ICD-10-CM

## 2014-07-21 MED ORDER — NA SULFATE-K SULFATE-MG SULF 17.5-3.13-1.6 GM/177ML PO SOLN: ORAL | Status: DC

## 2014-07-21 MED ORDER — IOHEXOL 300 MG/ML  SOLN
100.0000 mL | Freq: Once | INTRAMUSCULAR | Status: AC | PRN
  Administered 2014-07-21: 100 mL via INTRAVENOUS

## 2014-07-21 NOTE — Progress Notes (Signed)
Patient dropped off a urine to send for culture.

## 2014-07-21 NOTE — Telephone Encounter (Signed)
-----   Message from Andria Meuse, NP sent at 07/21/2014  2:22 PM EDT ----- Please let pt know CT is normal.  Nothing to explain pain. She does have stable fluid filled GYN cysts on both sides but that is not likely cause of her symptoms. She should FU with PCP/GYN for this. Please set up colonoscopy & EGD with Dr Allen Norris re: abdominal pain Thanks

## 2014-07-21 NOTE — Telephone Encounter (Signed)
Spoke with patient at this time. Explained all information below. Patient placed on schedule for EGD and Colonoscopy on 08/15/14 in [place] per patient preference. Suprep sent to pharmacy. Explained that if prep was too expensive to call our office for other arrangements. Letter printed with Endo/Colon instructions and sent to patient via mail at this time.   Orders need placed for 08/15/14 procedures.

## 2014-07-22 ENCOUNTER — Encounter: Payer: Self-pay | Admitting: Internal Medicine

## 2014-07-22 ENCOUNTER — Other Ambulatory Visit: Payer: Self-pay | Admitting: Urgent Care

## 2014-07-22 ENCOUNTER — Other Ambulatory Visit: Payer: Self-pay | Admitting: *Deleted

## 2014-07-22 LAB — URINE CULTURE

## 2014-07-25 ENCOUNTER — Telehealth: Payer: Self-pay

## 2014-07-25 ENCOUNTER — Other Ambulatory Visit: Payer: Self-pay | Admitting: Internal Medicine

## 2014-07-25 NOTE — Telephone Encounter (Signed)
Pt rescheduled for procedures to Wednesday, July 13th. [facility] has been contacted and appt moved. Pt has been advised to pick up sample from [place] office and wait for phone call from [facility] today.

## 2014-07-25 NOTE — Telephone Encounter (Signed)
Patient called in at this time and states that she needs to change her Colonoscopy date from 8/1 to 8/4 as her son's Birthday party is on 7/31 when she needs to be completing her prep. She would like to be placed on a list for cancellations if there are any prior to August.  She also explains that her Suprep will cost $84 per the pharmacy. I explained that I will put her a sample at the front desk in [place] for her to pick up.  She verbalizes understanding of this.   Date was changed accordingly on Dr. Dorothey Baseman schedule.  Orders will need changed to reflect 08/18/14 date change.

## 2014-07-26 LAB — IRON AND TIBC
%SAT: 21 % (ref 20–55)
Iron: 64 ug/dL (ref 42–145)
TIBC: 303 ug/dL (ref 250–470)
UIBC: 239 ug/dL (ref 125–400)

## 2014-07-26 LAB — FERRITIN: FERRITIN: 77 ng/mL (ref 10–291)

## 2014-07-26 LAB — HEPATITIS C ANTIBODY: HCV Ab: NEGATIVE

## 2014-07-26 LAB — ANA: ANA: NEGATIVE

## 2014-07-26 LAB — ANTI-SMITH ANTIBODY: ENA SM Ab Ser-aCnc: <1

## 2014-07-26 NOTE — Discharge Instructions (Signed)

## 2014-07-27 ENCOUNTER — Encounter
Admission: RE | Disposition: A | Discharge status: Home / Self Care | Payer: Self-pay | Source: Ambulatory Visit | Attending: Gastroenterology

## 2014-07-27 ENCOUNTER — Ambulatory Visit: Payer: 59 | Admitting: Anesthesiology

## 2014-07-27 ENCOUNTER — Other Ambulatory Visit: Payer: Self-pay | Admitting: Gastroenterology

## 2014-07-27 ENCOUNTER — Ambulatory Visit: Payer: 59 | Admitting: Obstetrics and Gynecology

## 2014-07-27 ENCOUNTER — Encounter: Payer: Self-pay | Admitting: Internal Medicine

## 2014-07-27 ENCOUNTER — Ambulatory Visit
Admission: RE | Admit: 2014-07-27 | Discharge: 2014-07-27 | Disposition: A | Discharge status: Home / Self Care | Payer: 59 | Source: Ambulatory Visit | Attending: Gastroenterology | Admitting: Gastroenterology

## 2014-07-27 ENCOUNTER — Encounter: Payer: Self-pay | Admitting: *Deleted

## 2014-07-27 DIAGNOSIS — K449 Diaphragmatic hernia without obstruction or gangrene: Secondary | ICD-10-CM | POA: Diagnosis not present

## 2014-07-27 DIAGNOSIS — K5900 Constipation, unspecified: Secondary | ICD-10-CM | POA: Insufficient documentation

## 2014-07-27 DIAGNOSIS — K640 First degree hemorrhoids: Secondary | ICD-10-CM | POA: Insufficient documentation

## 2014-07-27 DIAGNOSIS — R194 Change in bowel habit: Secondary | ICD-10-CM | POA: Diagnosis not present

## 2014-07-27 DIAGNOSIS — R1010 Upper abdominal pain, unspecified: Secondary | ICD-10-CM | POA: Diagnosis present

## 2014-07-27 DIAGNOSIS — R1013 Epigastric pain: Secondary | ICD-10-CM | POA: Insufficient documentation

## 2014-07-27 DIAGNOSIS — K2970 Gastritis, unspecified, without bleeding: Secondary | ICD-10-CM | POA: Insufficient documentation

## 2014-07-27 HISTORY — DX: Migraine, unspecified, not intractable, without status migrainosus: G43.909

## 2014-07-27 HISTORY — PX: COLONOSCOPY WITH PROPOFOL: SHX5780

## 2014-07-27 HISTORY — PX: ESOPHAGOGASTRODUODENOSCOPY (EGD) WITH PROPOFOL: SHX5813

## 2014-07-27 LAB — HM COLONOSCOPY: HM COLON: NORMAL

## 2014-07-27 SURGERY — ESOPHAGOGASTRODUODENOSCOPY (EGD) WITH PROPOFOL
Anesthesia: Monitor Anesthesia Care | Wound class: Clean Contaminated

## 2014-07-27 MED ORDER — LACTATED RINGERS IV SOLN
INTRAVENOUS | Status: DC
  Administered 2014-07-27 (×2): via INTRAVENOUS

## 2014-07-27 MED ORDER — ACETAMINOPHEN 160 MG/5ML PO SOLN: 325.0000 mg | ORAL | Status: DC | PRN

## 2014-07-27 MED ORDER — GLYCOPYRROLATE 0.2 MG/ML IJ SOLN
INTRAMUSCULAR | Status: DC | PRN
  Administered 2014-07-27: 0.1 mg via INTRAVENOUS

## 2014-07-27 MED ORDER — ACETAMINOPHEN 325 MG PO TABS: 325.0000 mg | ORAL_TABLET | ORAL | Status: DC | PRN

## 2014-07-27 MED ORDER — LIDOCAINE HCL (CARDIAC) 20 MG/ML IV SOLN
INTRAVENOUS | Status: DC | PRN
  Administered 2014-07-27: 50 mg via INTRAVENOUS

## 2014-07-27 MED ORDER — SIMETHICONE 40 MG/0.6ML PO SUSP
ORAL | Status: DC | PRN
  Administered 2014-07-27: 09:00:00

## 2014-07-27 MED ORDER — PROPOFOL 10 MG/ML IV BOLUS
INTRAVENOUS | Status: DC | PRN
  Administered 2014-07-27: 20 mg via INTRAVENOUS
  Administered 2014-07-27: 50 mg via INTRAVENOUS
  Administered 2014-07-27: 150 mg via INTRAVENOUS
  Administered 2014-07-27: 40 mg via INTRAVENOUS
  Administered 2014-07-27: 60 mg via INTRAVENOUS
  Administered 2014-07-27 (×2): 40 mg via INTRAVENOUS

## 2014-07-27 SURGICAL SUPPLY — 39 items

## 2014-07-27 NOTE — Op Note (Signed)
[facility] Patient Name: Connie Francis Procedure Date: 07/27/2014 9:10 AM MRN: [id number] Account #: [id number] Date of Birth: January 27, 1970 Admit Type: Outpatient Age: 44 Room: [facility] OR ROOM 01 Gender: Female Note Status: Finalized Procedure:         Upper GI endoscopy Indications:       Epigastric abdominal pain Providers:         Lucilla Lame, MD Referring MD:      Deborra Medina, MD (Referring MD) Medicines:         Propofol per Anesthesia Complications:     No immediate complications. Procedure:         Pre-Anesthesia Assessment:                    - Prior to the procedure, a History and Physical was                     performed, and patient medications and allergies were                     reviewed. The patient's tolerance of previous anesthesia                     was also reviewed. The risks and benefits of the procedure                     and the sedation options and risks were discussed with the                     patient. All questions were answered, and informed consent                     was obtained. Prior Anticoagulants: The patient has taken                     no previous anticoagulant or antiplatelet agents. ASA                     Grade Assessment: II - A patient with mild systemic                     disease. After reviewing the risks and benefits, the                     patient was deemed in satisfactory condition to undergo                     the procedure.                    After obtaining informed consent, the endoscope was passed                     under direct vision. Throughout the procedure, the                     patient's blood pressure, pulse, and oxygen saturations                     were monitored continuously. The Olympus GIF H180J                     endoscope (S#: [id number]) was introduced through the mouth,  and advanced to the second part of duodenum. The upper GI   endoscopy was accomplished without difficulty. The patient                     tolerated the procedure well. Findings:      A small hiatus hernia was present.      Two random biopsies were obtained with cold forceps for histology in the       middle third of the esophagus.      Localized mild inflammation characterized by erythema was found in the       gastric antrum. Biopsies were taken with a cold forceps for histology.      The examined duodenum was normal. Impression:        - Small hiatus hernia.                    - Gastritis. Biopsied.                    - Normal examined duodenum.                    - Biopsy performed in the middle third of the esophagus. Recommendation:    - Await pathology results.                    - Perform a colonoscopy today. Procedure Code(s): --- Professional ---                    [phone], Esophagogastroduodenoscopy, flexible, transoral;                     with biopsy, single or multiple Diagnosis Code(s): --- Professional ---                    R10.13, Epigastric pain                    K44.9, Diaphragmatic hernia without obstruction or gangrene                    K29.70, Gastritis, unspecified, without bleeding CPT copyright 2014 American Medical Association. All rights reserved. The codes documented in this report are preliminary and upon coder review may  be revised to meet current compliance requirements. Lucilla Lame, MD 07/27/2014 9:25:33 AM This report has been signed electronically. Number of Addenda: 0 Note Initiated On: 07/27/2014 9:10 AM Total Procedure Duration: 0 hours 2 minutes 10 seconds       [facility]

## 2014-07-27 NOTE — Anesthesia Postprocedure Evaluation (Signed)
  Anesthesia Post-op Note  Patient: Connie Francis  Procedure(s) Performed: Procedure(s): ESOPHAGOGASTRODUODENOSCOPY (EGD) WITH PROPOFOL (N/A) COLONOSCOPY WITH PROPOFOL (N/A)  Anesthesia type:MAC  Patient location: PACU  Post pain: Pain level controlled  Post assessment: Post-op Vital signs reviewed, Patient's Cardiovascular Status Stable, Respiratory Function Stable, Patent Airway and No signs of Nausea or vomiting  Post vital signs: Reviewed and stable  Last Vitals:  Filed Vitals:   07/27/14 1000  BP: 105/66  Pulse: 67  Temp:   Resp: 17    Level of consciousness: awake, alert  and patient cooperative  Complications: No apparent anesthesia complications

## 2014-07-27 NOTE — Anesthesia Procedure Notes (Signed)
Procedure Name: MAC Performed by: Johnanna Bakke Pre-anesthesia Checklist: Patient identified, Emergency Drugs available, Suction available, Patient being monitored and Timeout performed Patient Re-evaluated:Patient Re-evaluated prior to inductionOxygen Delivery Method: Nasal cannula Placement Confirmation: positive ETCO2 and breath sounds checked- equal and bilateral     

## 2014-07-27 NOTE — Transfer of Care (Signed)
Immediate Anesthesia Transfer of Care Note  Patient: Connie Francis  Procedure(s) Performed: Procedure(s): ESOPHAGOGASTRODUODENOSCOPY (EGD) WITH PROPOFOL (N/A) COLONOSCOPY WITH PROPOFOL (N/A)  Patient Location: PACU  Anesthesia Type: MAC  Level of Consciousness: awake, alert  and patient cooperative  Airway and Oxygen Therapy: Patient Spontanous Breathing and Patient connected to supplemental oxygen  Post-op Assessment: Post-op Vital signs reviewed, Patient's Cardiovascular Status Stable, Respiratory Function Stable, Patent Airway and No signs of Nausea or vomiting  Post-op Vital Signs: Reviewed and stable  Complications: No apparent anesthesia complications

## 2014-07-27 NOTE — H&P (Signed)
[facility]  [address] [place], [place] [address] Phone: [phone] Fax : [phone]  Primary Care Physician:  Crecencio Mc, MD Primary Gastroenterologist:  Dr. Allen Norris  Pre-Procedure History & Physical: HPI:  Connie Francis is a 44 y.o. female is here for an endoscopy and colonoscopy.   Past Medical History  Diagnosis Date  . Obsessive compulsive disorder   . Breast cyst     left breast, follow up with S[facility]  . Kidney stones   . Thyroid disease     hyper  . CHF (congestive heart failure)   . Lump or mass in breast     R-Breast  . Anxiety   . IBS (irritable bowel syndrome)   . GERD (gastroesophageal reflux disease)   . Abdominal bloating   . Chronic epigastric pain   . Constipation   . Migraine     Past Surgical History  Procedure Laterality Date  . Dilation and curettage of uterus  2979    complicated by heart failure  . Abdominal hysterectomy  2006    Partial  . Cesarean section    . Upper endoscopy w/ esophageal manometry    . Colonoscopy  2014  . Tubal ligation      Prior to Admission medications   Medication Sig Start Date End Date Taking? Authorizing Provider  ALPRAZolam (XANAX) 0.25 MG tablet Take 0.25 mg by mouth 2 (two) times daily as needed for anxiety.   Yes Historical Provider, MD  citalopram (CELEXA) 20 MG tablet Take 10 mg by mouth daily.  10/07/13  Yes Historical Provider, MD  dicyclomine (BENTYL) 10 MG capsule Take 10 mg by mouth as needed.    Yes Historical Provider, MD  ibuprofen (ADVIL,MOTRIN) 600 MG tablet Take 600 mg by mouth every 6 (six) hours as needed.   Yes Historical Provider, MD  Na Sulfate-K Sulfate-Mg Sulf (Ward) SOLN Per instructions given at appt 07/21/14  Yes Andria Meuse, NP  Probiotic Product (VSL#3 PO) Take 1 capsule by mouth 2 (two) times daily.   Yes Historical Provider, MD  ranitidine (ZANTAC) 150 MG tablet Take 1 tablet (150 mg total) by mouth 2 (two) times daily. Patient taking  differently: Take 150 mg by mouth once.  07/20/14  Yes Andria Meuse, NP  sertraline (ZOLOFT) 50 MG tablet Take 50 mg by mouth daily.   Yes Historical Provider, MD    Allergies as of 07/22/2014 - Review Complete 07/21/2014  Allergen Reaction Noted  . Latex  09/13/2010  . Penicillins  09/13/2010  . Promethazine  11/05/2011  . Shellfish allergy  09/13/2010  . Sulfa antibiotics  09/13/2010  . Lamictal [lamotrigine] Rash 07/18/2013    Family History  Problem Relation Age of Onset  . Cancer Maternal Aunt     parotid Ca  . Stroke Paternal Aunt   . Cancer Paternal Uncle     lung, nasopharnygeal  . Hearing loss Maternal Grandmother   . Alcohol abuse Maternal Grandmother   . Arthritis Maternal Grandfather   . Colon cancer Neg Hx   . Liver disease Neg Hx     History   Social History  . Marital Status: Married    Spouse Name: N/A  . Number of Children: N/A  . Years of Education: N/A   Occupational History  . Not on file.   Social History Main Topics  . Smoking status: Never Smoker   . Smokeless tobacco: Never Used  . Alcohol Use: No  Comment: Rare  . Drug Use: No  . Sexual Activity: No   Other Topics Concern  . Not on file   Social History Narrative    Review of Systems: See HPI, otherwise negative ROS  Physical Exam: BP 135/84 mmHg  Pulse 66  Temp(Src) 99.7 F (37.6 C) (Temporal)  Resp 14  Ht 5' 1.5" (1.562 m)  Wt 119 lb (53.978 kg)  BMI 22.12 kg/m2  SpO2 98% General:   Alert,  pleasant and cooperative in NAD Head:  Normocephalic and atraumatic. Neck:  Supple; no masses or thyromegaly. Lungs:  Clear throughout to auscultation.    Heart:  Regular rate and rhythm. Abdomen:  Soft, nontender and nondistended. Normal bowel sounds, without guarding, and without rebound.   Neurologic:  Alert and  oriented x4;  grossly normal neurologically.  Impression/Plan: Connie Francis is here for an endoscopy and colonoscopy to be performed for constipation and upper  abd pan.  Risks, benefits, limitations, and alternatives regarding  endoscopy and colonoscopy have been reviewed with the patient.  Questions have been answered.  All parties agreeable.   Ollen Bowl, MD  07/27/2014, 9:09 AM

## 2014-07-27 NOTE — Op Note (Signed)
[facility] Patient Name: Connie Francis Procedure Date: 07/27/2014 9:09 AM MRN: [id number] Account #: [id number] Date of Birth: May 09, 1970 Admit Type: Outpatient Age: 44 Room: [facility] OR ROOM 01 Gender: Female Note Status: Finalized Procedure:         Colonoscopy Indications:       Change in bowel habits, Constipation Providers:         Lucilla Lame, MD Medicines:         Propofol per Anesthesia Complications:     No immediate complications. Procedure:         Pre-Anesthesia Assessment:                    - Prior to the procedure, a History and Physical was                     performed, and patient medications and allergies were                     reviewed. The patient's tolerance of previous anesthesia                     was also reviewed. The risks and benefits of the procedure                     and the sedation options and risks were discussed with the                     patient. All questions were answered, and informed consent                     was obtained. Prior Anticoagulants: The patient has taken                     no previous anticoagulant or antiplatelet agents. ASA                     Grade Assessment: II - A patient with mild systemic                     disease. After reviewing the risks and benefits, the                     patient was deemed in satisfactory condition to undergo                     the procedure.                    After obtaining informed consent, the colonoscope was                     passed under direct vision. Throughout the procedure, the                     patient's blood pressure, pulse, and oxygen saturations                     were monitored continuously. The was introduced through                     the anus and advanced to the the cecum, identified by                     appendiceal orifice and ileocecal valve. The  colonoscopy                     was performed without difficulty. The patient  tolerated                     the procedure well. The quality of the bowel preparation                     was excellent. Findings:      The perianal and digital rectal examinations were normal.      Non-bleeding internal hemorrhoids were found during retroflexion. The       hemorrhoids were Grade I (internal hemorrhoids that do not prolapse). Impression:        - Non-bleeding internal hemorrhoids.                    - No specimens collected. Recommendation:    - High fiber diet. Procedure Code(s): --- Professional ---                    [phone], Colonoscopy, flexible; diagnostic, including                     collection of specimen(s) by brushing or washing, when                     performed (separate procedure) Diagnosis Code(s): --- Professional ---                    R19.4, Change in bowel habit                    K59.00, Constipation, unspecified                    K64.0, First degree hemorrhoids CPT copyright 2014 American Medical Association. All rights reserved. The codes documented in this report are preliminary and upon coder review may  be revised to meet current compliance requirements. Lucilla Lame, MD 07/27/2014 9:37:54 AM This report has been signed electronically. Number of Addenda: 0 Note Initiated On: 07/27/2014 9:09 AM Scope Withdrawal Time: 0 hours 6 minutes 34 seconds  Total Procedure Duration: 0 hours 8 minutes 30 seconds       [facility]

## 2014-07-27 NOTE — Anesthesia Preprocedure Evaluation (Signed)
Anesthesia Evaluation  Patient identified by MRN, date of birth, ID band  Reviewed: Allergy & Precautions, H&P , NPO status , Patient's Chart, lab work & pertinent test results  Airway Mallampati: II  TM Distance: >3 FB Neck ROM: full    Dental no notable dental hx.    Pulmonary    Pulmonary exam normal       Cardiovascular Rhythm:regular Rate:Normal     Neuro/Psych PSYCHIATRIC DISORDERS    GI/Hepatic GERD-  ,  Endo/Other    Renal/GU      Musculoskeletal   Abdominal   Peds  Hematology   Anesthesia Other Findings   Reproductive/Obstetrics                             Anesthesia Physical Anesthesia Plan  ASA: II  Anesthesia Plan: MAC   Post-op Pain Management:    Induction:   Airway Management Planned:   Additional Equipment:   Intra-op Plan:   Post-operative Plan:   Informed Consent: I have reviewed the patients History and Physical, chart, labs and discussed the procedure including the risks, benefits and alternatives for the proposed anesthesia with the patient or authorized representative who has indicated his/her understanding and acceptance.     Plan Discussed with: CRNA  Anesthesia Plan Comments:         Anesthesia Quick Evaluation

## 2014-07-28 ENCOUNTER — Encounter: Payer: Self-pay | Admitting: Gastroenterology

## 2014-08-03 ENCOUNTER — Other Ambulatory Visit (INDEPENDENT_AMBULATORY_CARE_PROVIDER_SITE_OTHER): Payer: 59 | Admitting: *Deleted

## 2014-08-03 ENCOUNTER — Other Ambulatory Visit: Payer: Self-pay

## 2014-08-03 ENCOUNTER — Telehealth: Payer: Self-pay

## 2014-08-03 DIAGNOSIS — K2100 Gastro-esophageal reflux disease with esophagitis, without bleeding: Secondary | ICD-10-CM

## 2014-08-03 DIAGNOSIS — Z1231 Encounter for screening mammogram for malignant neoplasm of breast: Secondary | ICD-10-CM

## 2014-08-03 DIAGNOSIS — R319 Hematuria, unspecified: Secondary | ICD-10-CM

## 2014-08-03 LAB — POCT URINALYSIS DIPSTICK
Glucose, UA: NEGATIVE
KETONES UA: NEGATIVE
NITRITE UA: NEGATIVE
PH UA: 6
PROTEIN UA: NEGATIVE
UROBILINOGEN UA: NEGATIVE

## 2014-08-03 NOTE — Progress Notes (Signed)
Patient would like urine sent off for urine culture, she is seeing blood in urine.

## 2014-08-03 NOTE — Telephone Encounter (Signed)
Spoke with pt regarding symptoms. Pt states she is still having issues with her stomach. Pt has tried and failed  Zantac 150mg  BID, Protonix 40mg  and Dexilant 60mg . She has experienced dizzy and headaches with these. Pt would like to know if there is anything else she can try. I told her we could possibly try a lower dose of Dexilant, but I would need to discuss with you first. Please advise.

## 2014-08-04 ENCOUNTER — Encounter: Payer: Self-pay | Admitting: General Surgery

## 2014-08-05 ENCOUNTER — Encounter: Payer: Self-pay | Admitting: Internal Medicine

## 2014-08-05 LAB — URINE CULTURE
Colony Count: NO GROWTH
Organism ID, Bacteria: NO GROWTH

## 2014-08-09 NOTE — Telephone Encounter (Signed)
Yes the patient can try a lower dose but if she continues to have symptoms she may need a esophageal pH study to see if she is having reflux as the cause of her symptoms.

## 2014-08-18 ENCOUNTER — Telehealth: Payer: Self-pay | Admitting: Gastroenterology

## 2014-08-18 NOTE — Telephone Encounter (Signed)
Patient is calling wanting some advice on her medications, She would like a call back today

## 2014-08-19 MED ORDER — DEXLANSOPRAZOLE 30 MG PO CPDR: 30.0000 mg | DELAYED_RELEASE_CAPSULE | Freq: Every day | ORAL | Status: DC

## 2014-08-19 NOTE — Telephone Encounter (Signed)
Advised pt we will try Dexilant 30mg  and let me know if her dizziness continues. If so, we will discuss other options.

## 2014-08-26 ENCOUNTER — Telehealth: Payer: Self-pay

## 2014-08-26 NOTE — Telephone Encounter (Signed)
-----   Message from Lucilla Lame, MD sent at 08/09/2014  5:42 PM EDT ----- Please have the patient come in to discuss the pathology results.

## 2014-08-26 NOTE — Telephone Encounter (Signed)
Pt called stated the Dexilant 30mg  is not working to ease symptoms. Advised pt per Allen Norris it is okay to increase to 60mg  daily. Pt didn't want to schedule an appt that was so far out.

## 2014-09-07 ENCOUNTER — Ambulatory Visit (INDEPENDENT_AMBULATORY_CARE_PROVIDER_SITE_OTHER): Payer: 59 | Admitting: Gastroenterology

## 2014-09-07 ENCOUNTER — Encounter: Payer: Self-pay | Admitting: Gastroenterology

## 2014-09-07 VITALS — BP 142/86 | HR 67 | Temp 98.3°F | Ht 62.0 in | Wt 127.0 lb

## 2014-09-07 DIAGNOSIS — K2940 Chronic atrophic gastritis without bleeding: Secondary | ICD-10-CM | POA: Diagnosis not present

## 2014-09-07 DIAGNOSIS — K589 Irritable bowel syndrome without diarrhea: Secondary | ICD-10-CM

## 2014-09-07 NOTE — Progress Notes (Signed)
Primary Care Physician: Crecencio Mc, MD  Primary Gastroenterologist:  Dr. Lucilla Lame  Chief Complaint  Patient presents with  . Follow up procedures    Colonoscopy/EGD    HPI: Connie Francis is a 44 y.o. female here for follow-up after having an EGD and colonoscopy. The patient reports that she is still having some discomfort with about the constipation. She also has abdominal cramping. The patient states that her dicyclomine has been causing her to have drowsiness so she very rarely takes it. She also states that she has not been taking her fiber on a regular basis. She also stopped taking her ranitidine. She does noticed that her symptoms are sometimes worse with her stress level. She does feel better when she has a bowel movement. The patient's upper endoscopy also showed her to have it atrophic gastritis.  Current Outpatient Prescriptions  Medication Sig Dispense Refill  . ALPRAZolam (XANAX) 0.25 MG tablet Take 0.25 mg by mouth 2 (two) times daily as needed for anxiety.    . dicyclomine (BENTYL) 10 MG capsule Take 10 mg by mouth as needed.     Marland Kitchen ibuprofen (ADVIL,MOTRIN) 600 MG tablet Take 600 mg by mouth every 6 (six) hours as needed.    . Probiotic Product (VSL#3 PO) Take 1 capsule by mouth 2 (two) times daily.    . citalopram (CELEXA) 20 MG tablet Take 10 mg by mouth daily.   2  . Dexlansoprazole 30 MG capsule Take 1 capsule (30 mg total) by mouth daily. (Patient not taking: Reported on 09/07/2014) 30 capsule 0  . ranitidine (ZANTAC) 150 MG tablet Take 1 tablet (150 mg total) by mouth 2 (two) times daily. (Patient not taking: Reported on 09/07/2014) 60 tablet 2  . sertraline (ZOLOFT) 50 MG tablet Take 50 mg by mouth daily.     No current facility-administered medications for this visit.    Allergies as of 09/07/2014 - Review Complete 09/07/2014  Allergen Reaction Noted  . Latex  09/13/2010  . Penicillins  09/13/2010  . Promethazine  11/05/2011  . Shellfish allergy   09/13/2010  . Sulfa antibiotics  09/13/2010  . Lamictal [lamotrigine] Rash 07/18/2013    ROS:  General: Negative for anorexia, weight loss, fever, chills, fatigue, weakness. ENT: Negative for hoarseness, difficulty swallowing , nasal congestion. CV: Negative for chest pain, angina, palpitations, dyspnea on exertion, peripheral edema.  Respiratory: Negative for dyspnea at rest, dyspnea on exertion, cough, sputum, wheezing.  GI: See history of present illness. GU:  Negative for dysuria, hematuria, urinary incontinence, urinary frequency, nocturnal urination.  Endo: Negative for unusual weight change.    Physical Examination:   BP 142/86 mmHg  Pulse 67  Temp(Src) 98.3 F (36.8 C) (Oral)  Ht 5\' 2"  (1.575 m)  Wt 127 lb (57.607 kg)  BMI 23.22 kg/m2  General: Well-nourished, well-developed in no acute distress.  Eyes: No icterus. Conjunctivae pink. Mouth: Oropharyngeal mucosa moist and pink , no lesions erythema or exudate. Lungs: Clear to auscultation bilaterally. Non-labored. Heart: Regular rate and rhythm, no murmurs rubs or gallops.  Abdomen: Bowel sounds are normal, nontender, nondistended, no hepatosplenomegaly or masses, no abdominal bruits or hernia , no rebound or guarding.   Extremities: No lower extremity edema. No clubbing or deformities. Neuro: Alert and oriented x 3.  Grossly intact. Skin: Warm and dry, no jaundice.   Psych: Alert and cooperative, normal mood and affect.  Labs:    Imaging Studies: No results found.  Assessment and Plan:   Connie  WYNDI Francis is a 44 y.o. y/o female  who has irritable bowel syndrome with constipation predominance. The patient has been told to go back on her Miralax which he says has helped her in the past. She is also been told to restart the dicyclomine when she is having her cramps. She does report that she has felt better in the past and Miralax and she believes this may help her greatly. She has been explained the pathophysiology of  irritable bowel syndrome and how she may have good days and bad days. Due to the atrophic gastritis found on the patient's upper endoscopy she will have her blood sent off for B12 and for anti-parietal cell antibodies. The patient has been explained the plan and agrees with it.   Note: This dictation was prepared with Dragon dictation along with smaller phrase technology. Any transcriptional errors that result from this process are unintentional.

## 2014-09-09 ENCOUNTER — Other Ambulatory Visit: Payer: Self-pay | Admitting: General Surgery

## 2014-09-09 ENCOUNTER — Telehealth: Payer: 59 | Admitting: Nurse Practitioner

## 2014-09-09 ENCOUNTER — Ambulatory Visit
Admission: RE | Admit: 2014-09-09 | Discharge: 2014-09-09 | Disposition: A | Discharge status: Home / Self Care | Payer: 59 | Source: Ambulatory Visit | Attending: General Surgery | Admitting: General Surgery

## 2014-09-09 DIAGNOSIS — Z1231 Encounter for screening mammogram for malignant neoplasm of breast: Secondary | ICD-10-CM | POA: Insufficient documentation

## 2014-09-09 DIAGNOSIS — J0101 Acute recurrent maxillary sinusitis: Secondary | ICD-10-CM | POA: Diagnosis not present

## 2014-09-09 LAB — HM MAMMOGRAPHY: HM Mammogram: NORMAL

## 2014-09-09 MED ORDER — AZITHROMYCIN 250 MG PO TABS: ORAL_TABLET | ORAL | Status: DC

## 2014-09-09 NOTE — Progress Notes (Signed)

## 2014-09-10 LAB — VITAMIN B12: Vitamin B-12: 355 pg/mL (ref 211–946)

## 2014-09-11 LAB — ANTI-PARIETAL ANTIBODY: Parietal Cell Ab: 11.4 Units (ref 0.0–20.0)

## 2014-09-12 ENCOUNTER — Telehealth: Payer: Self-pay

## 2014-09-12 NOTE — Telephone Encounter (Signed)
Pt notified of results

## 2014-09-12 NOTE — Telephone Encounter (Signed)
-----   Message from Lucilla Lame, MD sent at 09/12/2014  1:04 PM EDT ----- Let the patient know that her blood tests were not consistent with pernicious anemia.

## 2014-09-15 ENCOUNTER — Ambulatory Visit (INDEPENDENT_AMBULATORY_CARE_PROVIDER_SITE_OTHER): Payer: 59 | Admitting: General Surgery

## 2014-09-15 ENCOUNTER — Encounter: Payer: Self-pay | Admitting: General Surgery

## 2014-09-15 VITALS — BP 104/62 | HR 77 | Resp 14 | Ht 62.0 in | Wt 126.0 lb

## 2014-09-15 DIAGNOSIS — Z872 Personal history of diseases of the skin and subcutaneous tissue: Secondary | ICD-10-CM | POA: Diagnosis not present

## 2014-09-15 NOTE — Patient Instructions (Signed)
Follow up in one year  Continue self breast exams. Call office for any new breast issues or concerns.  

## 2014-09-15 NOTE — Progress Notes (Signed)
Patient ID: Connie Francis, female   DOB: 10-28-1970, 44 y.o.   MRN: [id number]  Chief Complaint  Patient presents with  . Follow-up    mammogram    HPI Connie Francis is a 44 y.o. female who presents for a breast evaluation. The most recent mammogram was done on 09/09/14. Patient does perform regular self breast checks and gets regular mammograms done. She recently had an upper endoscopy and colonoscopy completed and was diagnosed with atrophic gastritis. She is still having GI symptoms of pain after eating.    HPI  Past Medical History  Diagnosis Date  . Obsessive compulsive disorder   . Breast cyst     left breast, follow up with Bodin Gorka  . Kidney stones   . Thyroid disease     hyper  . Lump or mass in breast     R-Breast  . Anxiety   . IBS (irritable bowel syndrome)   . GERD (gastroesophageal reflux disease)   . Abdominal bloating   . Chronic epigastric pain   . Constipation   . Migraine   . 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 CHF (congestive heart failure) 2006    Past Surgical History  Procedure Laterality Date  . Dilation and curettage of uterus  6734    complicated by heart failure  . Abdominal hysterectomy  2006    Partial  . Cesarean section    . Upper endoscopy w/ esophageal manometry    . Colonoscopy  2014  . Tubal ligation    . Esophagogastroduodenoscopy (egd) with propofol N/A 07/27/2014    Procedure: ESOPHAGOGASTRODUODENOSCOPY (EGD) WITH PROPOFOL;  Surgeon: Lucilla Lame, MD;  Location: [place];  Service: Endoscopy;  Laterality: N/A;  . Colonoscopy with propofol N/A 07/27/2014    Procedure: COLONOSCOPY WITH PROPOFOL;  Surgeon: Lucilla Lame, MD;  Location: [place];  Service: Endoscopy;  Laterality: N/A;  . Breast cyst aspiration Left     negative 2010    Family History  Problem Relation Age of Onset  . Cancer Maternal Aunt     parotid Ca  . Stroke Paternal Aunt   . Cancer Paternal Uncle     lung, nasopharnygeal  . Hearing  loss Maternal Grandmother   . Alcohol abuse Maternal Grandmother   . Arthritis Maternal Grandfather   . Colon cancer Neg Hx   . Liver disease Neg Hx   . Breast cancer Neg Hx     Social History Social History  Substance Use Topics  . Smoking status: Never Smoker   . Smokeless tobacco: Never Used  . Alcohol Use: No     Comment: Rare    Allergies  Allergen Reactions  . Latex     Swelling and itching   . Penicillins     swelling  . Promethazine   . Shellfish Allergy     hives  . Sulfa Antibiotics     Rash and itching   . Lamictal [Lamotrigine] Rash    Blisters     Current Outpatient Prescriptions  Medication Sig Dispense Refill  . ALPRAZolam (XANAX) 0.25 MG tablet Take 0.25 mg by mouth 2 (two) times daily as needed for anxiety.    . citalopram (CELEXA) 20 MG tablet Take 10 mg by mouth daily.   2  . Dexlansoprazole 30 MG capsule Take 1 capsule (30 mg total) by mouth daily. 30 capsule 0  . dicyclomine (BENTYL) 10 MG capsule Take 10 mg by mouth as needed.     Marland Kitchen  ibuprofen (ADVIL,MOTRIN) 600 MG tablet Take 600 mg by mouth every 6 (six) hours as needed.    . Probiotic Product (VSL#3 PO) Take 1 capsule by mouth 2 (two) times daily.    . ranitidine (ZANTAC) 150 MG tablet Take 1 tablet (150 mg total) by mouth 2 (two) times daily. 60 tablet 2  . sertraline (ZOLOFT) 50 MG tablet Take 50 mg by mouth daily.     No current facility-administered medications for this visit.    Review of Systems Review of Systems  Constitutional: Negative.   Respiratory: Negative.   Cardiovascular: Negative.     Blood pressure 104/62, pulse 77, resp. rate 14, height 5\' 2"  (1.575 m), weight 126 lb (57.153 kg).  Physical Exam Physical Exam  Constitutional: She is oriented to person, place, and time. She appears well-developed and well-nourished.  Eyes: Conjunctivae are normal. No scleral icterus.  Neck: Neck supple.  Cardiovascular: Normal rate, regular rhythm and normal heart sounds.    Pulmonary/Chest: Effort normal and breath sounds normal. Right breast exhibits no inverted nipple, no mass, no nipple discharge, no skin change and no tenderness. Left breast exhibits no inverted nipple, no mass, no nipple discharge, no skin change and no tenderness.  Abdominal: Soft. Bowel sounds are normal.  Lymphadenopathy:    She has no cervical adenopathy.    She has no axillary adenopathy.  Neurological: She is alert and oriented to person, place, and time.  Skin: Skin is warm and dry.  Psychiatric: She has a normal mood and affect.    Data Reviewed Mammogram stable  Assessment    Stable exam. History of breast cysts     Plan    Follow up on one year with bilateral screening mammogram and office visit.     PCP: Deirdre Pippins 09/15/2014, 11:51 AM

## 2014-09-20 ENCOUNTER — Telehealth: Payer: Self-pay | Admitting: Gastroenterology

## 2014-09-20 DIAGNOSIS — R112 Nausea with vomiting, unspecified: Secondary | ICD-10-CM

## 2014-09-20 DIAGNOSIS — R1084 Generalized abdominal pain: Secondary | ICD-10-CM

## 2014-09-20 NOTE — Telephone Encounter (Signed)
Patient has been seeing Dr.Wohl for stomach problems , And this past weekend now vomiting and having pain. She said she does have IBS but has never been this bad please call patient [phone]

## 2014-09-21 NOTE — Telephone Encounter (Signed)
Called pt and she stated that she is having horrible RUQ pain and radiating to his back. Please see message below as well and advise.

## 2014-09-22 MED ORDER — ONDANSETRON HCL 4 MG PO TABS: 4.0000 mg | ORAL_TABLET | Freq: Four times a day (QID) | ORAL | Status: DC | PRN

## 2014-09-22 NOTE — Telephone Encounter (Signed)
Please put patient on Zofran and get a gall bladder HIDA scan with [facility].

## 2014-09-22 NOTE — Telephone Encounter (Signed)
Pt notified of Dr. Dorothey Baseman recommendations. Rx for Zofran sent to [facility] employee pharmacy and [place] with HIDA scan ordered.

## 2014-09-26 ENCOUNTER — Ambulatory Visit
Admission: RE | Admit: 2014-09-26 | Discharge: 2014-09-26 | Disposition: A | Discharge status: Home / Self Care | Payer: 59 | Source: Ambulatory Visit | Attending: Gastroenterology | Admitting: Gastroenterology

## 2014-09-26 DIAGNOSIS — R1084 Generalized abdominal pain: Secondary | ICD-10-CM | POA: Insufficient documentation

## 2014-09-26 DIAGNOSIS — R112 Nausea with vomiting, unspecified: Secondary | ICD-10-CM | POA: Insufficient documentation

## 2014-09-29 ENCOUNTER — Telehealth: Payer: Self-pay

## 2014-09-29 NOTE — Telephone Encounter (Signed)
-----   Message from Lucilla Lame, MD sent at 09/26/2014 10:37 AM EDT ----- Let the patient know the Ultrasound was normal.

## 2014-09-29 NOTE — Telephone Encounter (Signed)
Pt notified of US results.  

## 2014-10-07 ENCOUNTER — Ambulatory Visit
Admission: RE | Admit: 2014-10-07 | Discharge: 2014-10-07 | Disposition: A | Discharge status: Home / Self Care | Payer: 59 | Source: Ambulatory Visit | Attending: Gastroenterology | Admitting: Gastroenterology

## 2014-10-07 DIAGNOSIS — R1084 Generalized abdominal pain: Secondary | ICD-10-CM | POA: Insufficient documentation

## 2014-10-07 DIAGNOSIS — R112 Nausea with vomiting, unspecified: Secondary | ICD-10-CM | POA: Diagnosis present

## 2014-10-07 MED ORDER — TECHNETIUM TC 99M MEBROFENIN IV KIT
5.0000 | PACK | Freq: Once | INTRAVENOUS | Status: DC | PRN
  Administered 2014-10-07: 4.91 via INTRAVENOUS
  Filled 2014-10-07: qty 6

## 2014-10-07 MED ORDER — SINCALIDE 5 MCG IJ SOLR
0.0200 ug/kg | Freq: Once | INTRAMUSCULAR | Status: AC
  Administered 2014-10-07: 1.15 ug via INTRAVENOUS

## 2014-10-10 ENCOUNTER — Ambulatory Visit (INDEPENDENT_AMBULATORY_CARE_PROVIDER_SITE_OTHER)
Admission: RE | Admit: 2014-10-10 | Discharge: 2014-10-10 | Disposition: A | Discharge status: Home / Self Care | Payer: 59 | Source: Ambulatory Visit | Attending: Family Medicine | Admitting: Family Medicine

## 2014-10-10 ENCOUNTER — Ambulatory Visit (INDEPENDENT_AMBULATORY_CARE_PROVIDER_SITE_OTHER): Payer: 59 | Admitting: Family Medicine

## 2014-10-10 ENCOUNTER — Encounter: Payer: Self-pay | Admitting: Family Medicine

## 2014-10-10 VITALS — BP 118/80 | HR 55 | Temp 98.8°F | Ht 62.0 in | Wt 123.5 lb

## 2014-10-10 DIAGNOSIS — R05 Cough: Secondary | ICD-10-CM | POA: Diagnosis not present

## 2014-10-10 DIAGNOSIS — R0781 Pleurodynia: Secondary | ICD-10-CM

## 2014-10-10 DIAGNOSIS — R059 Cough, unspecified: Secondary | ICD-10-CM | POA: Insufficient documentation

## 2014-10-10 MED ORDER — HYDROCODONE-HOMATROPINE 5-1.5 MG/5ML PO SYRP: 5.0000 mL | ORAL_SOLUTION | Freq: Three times a day (TID) | ORAL | Status: DC | PRN

## 2014-10-10 MED ORDER — DOXYCYCLINE HYCLATE 100 MG PO TABS: 100.0000 mg | ORAL_TABLET | Freq: Two times a day (BID) | ORAL | Status: DC

## 2014-10-10 NOTE — Progress Notes (Signed)
Subjective:  Patient ID: Connie Francis, female    DOB: 09/15/1970  Age: 44 y.o. MRN: [id number]  CC: Cough, SOB, Pain with breathing  HPI:  44 year old female presents to clinic today for an acute visit with complaints of cough, pain with breathing, and shortness of breath with exertion.  Patient reports that she has had the above symptoms for approximately 1 week. She reports associated low-grade fever at home. She reports marked pain with inspiration of the lower ribs bilaterally.  No known inciting factor. No recent sick contacts. No exacerbating or relieving factors. Patient states that this feels similar to when she had walking pneumonia previously.  Social Hx   Social History   Social History  . Marital Status: Married    Spouse Name: N/A  . Number of Children: N/A  . Years of Education: N/A   Social History Main Topics  . Smoking status: Never Smoker   . Smokeless tobacco: Never Used  . Alcohol Use: No     Comment: Rare  . Drug Use: No  . Sexual Activity: No   Other Topics Concern  . None   Social History Narrative   Review of Systems  Constitutional: Positive for fever.  Respiratory: Positive for cough and shortness of breath.    Objective:  BP 118/80 mmHg  Pulse 55  Temp(Src) 98.8 F (37.1 C) (Oral)  Ht 5\' 2"  (1.575 m)  Wt 123 lb 8 oz (56.019 kg)  BMI 22.58 kg/m2  SpO2 97%  BP/Weight 10/10/2014 7/67/2094 7/0/9628  Systolic BP 366 - 294  Diastolic BP 80 - 62  Wt. (Lbs) 123.5 126 126  BMI 22.58 23.04 23.04   Physical Exam  Constitutional: She is oriented to person, place, and time. She appears well-developed and well-nourished. No distress.  HENT:  Head: Normocephalic and atraumatic.  Mouth/Throat: No oropharyngeal exudate.  Mild erythema of the oropharynx. Normal TM's bilaterally.  Cardiovascular: Normal rate and regular rhythm.   No murmur heard. Pulmonary/Chest: Effort normal and breath sounds normal. No respiratory distress. She has no wheezes.  She has no rales.  Neurological: She is alert and oriented to person, place, and time.  Psychiatric:  Flat affect noted.   Vitals reviewed.  Lab Results  Component Value Date   WBC 5.2 06/24/2013   HGB 12.6 06/24/2013   HCT 37.8 06/24/2013   PLT 261 06/24/2013   GLUCOSE 90 06/24/2013   CHOL 179 07/15/2014   TRIG 95.0 07/15/2014   HDL 53.80 07/15/2014   LDLCALC 106* 07/15/2014   ALT 12 07/15/2014   AST 17 07/15/2014   NA 140 06/24/2013   K 3.8 06/24/2013   CL 102 06/24/2013   CREATININE 0.63 06/24/2013   BUN 8 06/24/2013   CO2 31 06/24/2013   TSH 0.727 07/06/2013   Assessment & Plan:   Problem List Items Addressed This Visit    Cough - Primary    Cough with pleuritic pain x 1 week. Normal exam today. Obtaining chest xray. Treating as acute bronchitis/? PNA. Doxycycline given (to be filled after xray results). Hycodan for cough.      Relevant Medications   HYDROcodone-homatropine (HYCODAN) 5-1.5 MG/5ML syrup   doxycycline (VIBRA-TABS) 100 MG tablet   Other Relevant Orders   DG Chest 2 View (Completed)    Other Visit Diagnoses    Pleuritic pain        Relevant Medications    HYDROcodone-homatropine (HYCODAN) 5-1.5 MG/5ML syrup    doxycycline (VIBRA-TABS) 100 MG tablet  Other Relevant Orders    DG Chest 2 View (Completed)       Meds ordered this encounter  Medications  . HYDROcodone-homatropine (HYCODAN) 5-1.5 MG/5ML syrup    Sig: Take 5 mLs by mouth every 8 (eight) hours as needed for cough.    Dispense:  120 mL    Refill:  0  . doxycycline (VIBRA-TABS) 100 MG tablet    Sig: Take 1 tablet (100 mg total) by mouth 2 (two) times daily.    Dispense:  20 tablet    Refill:  0    Follow-up: PRN  Thersa Salt, DO

## 2014-10-10 NOTE — Assessment & Plan Note (Addendum)
Cough with pleuritic pain x 1 week. Normal exam today. Obtaining chest xray. Treating as acute bronchitis/? PNA. Doxycycline given (to be filled after xray results). Hycodan for cough.

## 2014-10-10 NOTE — Patient Instructions (Signed)
It was nice to see you today.  We will call you with the xray results.  Use the hycodan for cough (particularly at night)  Take care  Dr. Lacinda Axon

## 2014-10-10 NOTE — Progress Notes (Signed)
Pre visit review using our clinic review tool, if applicable. No additional management support is needed unless otherwise documented below in the visit note. 

## 2014-10-11 ENCOUNTER — Telehealth: Payer: Self-pay

## 2014-10-11 NOTE — Telephone Encounter (Signed)
-----   Message from Lucilla Lame, MD sent at 10/10/2014 11:20 AM EDT ----- Make sure we have inform this patient that her gallbladder ejection fraction was 99% with normal being over 40%.

## 2014-10-11 NOTE — Telephone Encounter (Signed)
Pt notified of results

## 2014-10-17 ENCOUNTER — Telehealth: Payer: Self-pay | Admitting: *Deleted

## 2014-10-17 NOTE — Telephone Encounter (Signed)
Patient called and has a account credit and was told on 09/15/14 that when she was checking out that the credit would apply to her account balance. Pt just got her statement in the mail and the credit was not applied.

## 2014-10-18 NOTE — Telephone Encounter (Signed)
Patient aware of her new balance and paid over the phone.

## 2014-11-30 ENCOUNTER — Encounter: Payer: Self-pay | Admitting: Obstetrics and Gynecology

## 2014-11-30 ENCOUNTER — Ambulatory Visit (INDEPENDENT_AMBULATORY_CARE_PROVIDER_SITE_OTHER): Payer: 59 | Admitting: Obstetrics and Gynecology

## 2014-11-30 VITALS — BP 136/67 | HR 65 | Ht 62.6 in | Wt 123.2 lb

## 2014-11-30 DIAGNOSIS — Z0000 Encounter for general adult medical examination without abnormal findings: Secondary | ICD-10-CM | POA: Diagnosis not present

## 2014-11-30 DIAGNOSIS — K589 Irritable bowel syndrome without diarrhea: Secondary | ICD-10-CM | POA: Diagnosis not present

## 2014-11-30 DIAGNOSIS — Z90710 Acquired absence of both cervix and uterus: Secondary | ICD-10-CM | POA: Diagnosis not present

## 2014-11-30 DIAGNOSIS — K5900 Constipation, unspecified: Secondary | ICD-10-CM

## 2014-11-30 DIAGNOSIS — N809 Endometriosis, unspecified: Secondary | ICD-10-CM

## 2014-11-30 DIAGNOSIS — Z01419 Encounter for gynecological examination (general) (routine) without abnormal findings: Secondary | ICD-10-CM

## 2014-11-30 DIAGNOSIS — K5909 Other constipation: Secondary | ICD-10-CM

## 2014-11-30 NOTE — Patient Instructions (Signed)
1.  Necessary. 2.  Mammogram already done per surgery 3.  Recommend increase water intake, Colace, stool softener, increase fiber in diet, Metamucil fiber additive for management of IBS/chronic constipation 4.  Endometriosis is asymptomatic. 5.  Return in 1 year.Marland Kitchen

## 2014-11-30 NOTE — Progress Notes (Signed)
GYN ANNUAL PREVENTATIVE CARE ENCOUNTER NOTE  Subjective:       Connie Francis is a 44 y.o. [phone] female here for a routine annual gynecologic exam.  Current complaints:  1.  Chronic constipation. 2.  History of endometriosis.     Gynecologic History No LMP recorded. Patient has had a hysterectomy. Contraception: status post hysterectomy TVH Last Pap: Not needed Last mammogram: negative, followed by Dr. Jamal Collin, surgery  Obstetric History OB History  Gravida Para Term Preterm AB SAB TAB Ectopic Multiple Living  3    1 1    2     # Outcome Date GA Lbr Len/2nd Weight Sex Delivery Anes PTL Lv  3 Gravida           2 Gravida           1 SAB             Obstetric Comments  Menstrual: 54  First pregnancy at age 54    Past Medical History  Diagnosis Date  . Obsessive compulsive disorder   . Breast cyst     left breast, follow up with [facility]  . Kidney stones   . Thyroid disease     hyper  . Lump or mass in breast     R-Breast  . Anxiety   . IBS (irritable bowel syndrome)   . GERD (gastroesophageal reflux disease)   . Abdominal bloating   . Chronic epigastric pain   . Constipation   . Migraine   . 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 CHF (congestive heart failure) ([place]) 2006  . Hiatal hernia 2017    Past Surgical History  Procedure Laterality Date  . Dilation and curettage of uterus  123456    complicated by heart failure  . Abdominal hysterectomy  2006    Partial  . Cesarean section    . Upper endoscopy w/ esophageal manometry    . Colonoscopy  2014  . Tubal ligation    . Esophagogastroduodenoscopy (egd) with propofol N/A 07/27/2014    Procedure: ESOPHAGOGASTRODUODENOSCOPY (EGD) WITH PROPOFOL;  Surgeon: Lucilla Lame, MD;  Location: [facility];  Service: Endoscopy;  Laterality: N/A;  . Colonoscopy with propofol N/A 07/27/2014    Procedure: COLONOSCOPY WITH PROPOFOL;  Surgeon: Lucilla Lame, MD;  Location: [facility];  Service: Endoscopy;   Laterality: N/A;  . Breast cyst aspiration Left     negative 2010  . Colonscopy  07/27/2015    Current Outpatient Prescriptions on File Prior to Visit  Medication Sig Dispense Refill  . ALPRAZolam (XANAX) 0.25 MG tablet Take 0.25 mg by mouth 2 (two) times daily as needed for anxiety.    . citalopram (CELEXA) 20 MG tablet Take 10 mg by mouth daily.   2  . Dexlansoprazole 30 MG capsule Take 1 capsule (30 mg total) by mouth daily. 30 capsule 0  . dicyclomine (BENTYL) 10 MG capsule Take 10 mg by mouth as needed.     . doxycycline (VIBRA-TABS) 100 MG tablet Take 1 tablet (100 mg total) by mouth 2 (two) times daily. 20 tablet 0  . HYDROcodone-homatropine (HYCODAN) 5-1.5 MG/5ML syrup Take 5 mLs by mouth every 8 (eight) hours as needed for cough. 120 mL 0  . ibuprofen (ADVIL,MOTRIN) 600 MG tablet Take 600 mg by mouth every 6 (six) hours as needed.    . ondansetron (ZOFRAN) 4 MG tablet Take 1 tablet (4 mg total) by mouth every 6 (six) hours as needed for nausea or vomiting. Hubbard  tablet 1  . Probiotic Product (VSL#3 PO) Take 1 capsule by mouth 2 (two) times daily.    . ranitidine (ZANTAC) 150 MG tablet Take 1 tablet (150 mg total) by mouth 2 (two) times daily. 60 tablet 2  . sertraline (ZOLOFT) 50 MG tablet Take 50 mg by mouth daily.     No current facility-administered medications on file prior to visit.    Allergies  Allergen Reactions  . Latex     Swelling and itching   . Penicillins     swelling  . Promethazine   . Shellfish Allergy     hives  . Sulfa Antibiotics     Rash and itching   . Lamictal [Lamotrigine] Rash    Blisters     Social History   Social History  . Marital Status: Married    Spouse Name: N/A  . Number of Children: N/A  . Years of Education: N/A   Occupational History  . Not on file.   Social History Main Topics  . Smoking status: Never Smoker   . Smokeless tobacco: Never Used  . Alcohol Use: No     Comment: Rare  . Drug Use: No  . Sexual Activity: No    Other Topics Concern  . Not on file   Social History Narrative    Family History  Problem Relation Age of Onset  . Cancer Maternal Aunt     parotid Ca  . Stroke Paternal Aunt   . Cancer Paternal Uncle     lung, nasopharnygeal  . Hearing loss Maternal Grandmother   . Alcohol abuse Maternal Grandmother   . Arthritis Maternal Grandfather   . Colon cancer Neg Hx   . Liver disease Neg Hx   . Breast cancer Neg Hx     The following portions of the patient's history were reviewed and updated as appropriate: allergies, current medications, past family history, past medical history, past social history, past surgical history and problem list.  Review of Systems ROS   Objective:   BP 136/67 mmHg  Pulse 65  Ht 5' 2.6" (1.59 m)  Wt 123 lb 4 oz (55.906 kg)  BMI 22.11 kg/m2 Physical Exam  Constitutional: She is oriented to person, place, and time. She appears well-developed and well-nourished.  HENT:  Head: Normocephalic and atraumatic.  Neck: Normal range of motion. Neck supple. No thyromegaly present.  Cardiovascular: Normal rate, regular rhythm and normal heart sounds.   Pulmonary/Chest: Effort normal and breath sounds normal.  Abdominal: Soft. She exhibits no distension. There is no tenderness.  Genitourinary:  PELVIC: External genitalia-normal BUS-normal. Vagina-normal estrogen effect; good vault support; palpable firm stool in the rectal vault (noted through the vaginal mucosa). Rectal-deferred per patient request and due to recent colonoscopy. Cervix-surgically absent Uterus-surgically absent Adnexa-nonpalpable; nontender  Musculoskeletal: Normal range of motion.  Lymphadenopathy:    She has no cervical adenopathy.  Neurological: She is alert and oriented to person, place, and time.  Skin: Skin is warm and dry. No rash noted.  Psychiatric: She has a normal mood and affect. Her behavior is normal.    Assessment:   Annual gynecologic examination 44  y.o. Contraception: status post hysterectomy Normal BMI Patient Active Problem List   Diagnosis Date Noted  . Cough 10/10/2014  . IBS (irritable bowel syndrome) 07/17/2014  . S/P total hysterectomy 07/15/2014  . Elevated liver function tests 07/15/2014  . DOE (dyspnea on exertion) 03/03/2014  . Chest tightness 03/03/2014  . Palpitations 07/19/2013  . Anancastic neurosis  07/12/2013  . Cyst (solitary) of breast 07/12/2013  . S/P hysterectomy 07/06/2013  . Edema 01/18/2013  . Elevated blood pressure 01/18/2013  . Psychogenic dyspepsia 11/27/2012  . Routine general medical examination at a health care facility 06/18/2012  . Diffuse cystic mastopathy 06/18/2012  . H/O renal calculi 05/05/2012  . Acquired cyst of kidney 02/18/2012  . Excessive urination at night 02/18/2012  . H/O urinary stone 02/18/2012  . Depression with anxiety 01/07/2012  . Depression, neurotic 01/07/2012  . Adaptive colitis 12/06/2011  . Breast cyst   . Obsessive compulsive disorder   . 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 Derrel Nip Routine preventative health maintenance measures emphasized: Diet/Weight control, Tobacco Cessation and Alcohol/Drug use Encourage increased water intake, Colace, high-fiber diet, Metamucil for chronic constipation/IBS Return to Clinic - 1 Year   Brayton Mars, MD

## 2014-11-30 NOTE — Progress Notes (Signed)
Patient ID: Connie Francis, female   DOB: 14-Jan-1971, 44 y.o.   MRN: [id number] Annual exam Mammogram: 09/09/2014 normal (Dr. Jamal Collin) Pap: 07/07/2010  Colonscopy/endoscopy: negative; hiatal hernia, hemorroids CT: abdomen pelvis 07/2014

## 2014-12-01 ENCOUNTER — Ambulatory Visit (INDEPENDENT_AMBULATORY_CARE_PROVIDER_SITE_OTHER): Payer: 59 | Admitting: Internal Medicine

## 2014-12-01 ENCOUNTER — Encounter: Payer: Self-pay | Admitting: Internal Medicine

## 2014-12-01 VITALS — BP 126/80 | HR 69 | Temp 98.3°F | Wt 124.0 lb

## 2014-12-01 DIAGNOSIS — E162 Hypoglycemia, unspecified: Secondary | ICD-10-CM | POA: Diagnosis not present

## 2014-12-01 DIAGNOSIS — E161 Other hypoglycemia: Secondary | ICD-10-CM

## 2014-12-01 NOTE — Progress Notes (Signed)
Subjective:  Patient ID: Connie Francis, female    DOB: Jan 18, 1970  Age: 44 y.o. MRN: [id number]  CC: The primary encounter diagnosis was Hypoglycemia. A diagnosis of Reactive hypoglycemia was also pertinent to this visit.  HPI Connie Francis presents for RECURRENT HYPOGLYCEMIA.  HAD AN episode of feeling "funny" while out doing multiple errands,  Blurred vision,  Feeling sleepy,  Gelt better after she drank a Pepsi.    2nd episode occurred last week,  CBG done at work was 6,  Had just eaten a sausage biscuit and a Pepsi.    Has rreduced her Pepsi consumption from 8 to 10 bottles daily down to 3!!!        Outpatient Prescriptions Prior to Visit  Medication Sig Dispense Refill  . ALPRAZolam (XANAX) 0.25 MG tablet Take 0.25 mg by mouth 2 (two) times daily as needed for anxiety.    . citalopram (CELEXA) 20 MG tablet Take 10 mg by mouth daily.   2  . Dexlansoprazole 30 MG capsule Take 1 capsule (30 mg total) by mouth daily. 30 capsule 0  . dicyclomine (BENTYL) 10 MG capsule Take 10 mg by mouth as needed.     . doxycycline (VIBRA-TABS) 100 MG tablet Take 1 tablet (100 mg total) by mouth 2 (two) times daily. 20 tablet 0  . HYDROcodone-homatropine (HYCODAN) 5-1.5 MG/5ML syrup Take 5 mLs by mouth every 8 (eight) hours as needed for cough. 120 mL 0  . ibuprofen (ADVIL,MOTRIN) 600 MG tablet Take 600 mg by mouth every 6 (six) hours as needed.    . ondansetron (ZOFRAN) 4 MG tablet Take 1 tablet (4 mg total) by mouth every 6 (six) hours as needed for nausea or vomiting. 30 tablet 1  . Probiotic Product (VSL#3 PO) Take 1 capsule by mouth 2 (two) times daily.    . ranitidine (ZANTAC) 150 MG tablet Take 1 tablet (150 mg total) by mouth 2 (two) times daily. 60 tablet 2  . sertraline (ZOLOFT) 50 MG tablet Take 50 mg by mouth daily.     No facility-administered medications prior to visit.    Review of Systems;  Patient denies headache, fevers, malaise, unintentional weight loss, skin rash, eye  pain, sinus congestion and sinus pain, sore throat, dysphagia,  hemoptysis , cough, dyspnea, wheezing, chest pain, palpitations, orthopnea, edema, abdominal pain, nausea, melena, diarrhea, constipation, flank pain, dysuria, hematuria, urinary  Frequency, nocturia, numbness, tingling, seizures,  Focal weakness, Loss of consciousness,  Tremor, insomnia, depression, anxiety, and suicidal ideation.      Objective:  BP 126/80 mmHg  Pulse 69  Temp(Src) 98.3 F (36.8 C) (Oral)  Wt 124 lb (56.246 kg)  SpO2 97%  BP Readings from Last 3 Encounters:  12/01/14 126/80  11/30/14 136/67  10/10/14 118/80    Wt Readings from Last 3 Encounters:  12/01/14 124 lb (56.246 kg)  11/30/14 123 lb 4 oz (55.906 kg)  10/10/14 123 lb 8 oz (56.019 kg)    General appearance: alert, cooperative and appears stated age Ears: normal TM's and external ear canals both ears Throat: lips, mucosa, and tongue normal; teeth and gums normal Neck: no adenopathy, no carotid bruit, supple, symmetrical, trachea midline and thyroid not enlarged, symmetric, no tenderness/mass/nodules Back: symmetric, no curvature. ROM normal. No CVA tenderness. Lungs: clear to auscultation bilaterally Heart: regular rate and rhythm, S1, S2 normal, no murmur, click, rub or gallop Abdomen: soft, non-tender; bowel sounds normal; no masses,  no organomegaly Pulses: 2+ and symmetric Skin: Skin color,  texture, turgor normal. No rashes or lesions Lymph nodes: Cervical, supraclavicular, and axillary nodes normal.  No results found for: HGBA1C  Lab Results  Component Value Date   CREATININE 0.63 06/24/2013   CREATININE 0.64 06/12/2013   CREATININE 0.64 02/15/2013    Lab Results  Component Value Date   WBC 5.2 06/24/2013   HGB 12.6 06/24/2013   HCT 37.8 06/24/2013   PLT 261 06/24/2013   GLUCOSE 90 06/24/2013   CHOL 179 07/15/2014   TRIG 95.0 07/15/2014   HDL 53.80 07/15/2014   LDLCALC 106* 07/15/2014   ALT 12 07/15/2014   AST 17  07/15/2014   NA 140 06/24/2013   K 3.8 06/24/2013   CL 102 06/24/2013   CREATININE 0.63 06/24/2013   BUN 8 06/24/2013   CO2 31 06/24/2013   TSH 0.727 07/06/2013    Dg Chest 2 View  10/10/2014  CLINICAL DATA:  Cough, shortness of breath, pleuritic chest pain EXAM: CHEST  2 VIEW COMPARISON:  Chest CT scan of March 11, 2014 and chest x-ray of December 24, 2013 FINDINGS: The lungs are well-expanded and clear. There is no pleural effusion or pneumothorax. The heart and pulmonary vascularity are normal. The mediastinum is normal in width. There is stable mild S-shaped thoracolumbar scoliosis. IMPRESSION: There is no active cardiopulmonary disease. Electronically Signed   By: David  Martinique M.D.   On: 10/10/2014 13:44    Assessment & Plan:   Problem List Items Addressed This Visit    Reactive hypoglycemia    Secondary to diet which  is full on concentrated sweets.  Diet discussed with patient .  Advised to stop drinking sodas ,  Combine protein with all carbohydrate meals.   No results found for: HGBA1C        Other Visit Diagnoses    Hypoglycemia    -  Primary    Relevant Orders    Comprehensive metabolic panel    Hemoglobin A1c    Lipid panel    LDL cholesterol, direct     A total of 40 minutes of face to face time was spent with patient more than half of which was spent in counselling and coordination of care    I am having Ms. Macedo maintain her ALPRAZolam, sertraline, ibuprofen, citalopram, Probiotic Product (VSL#3 PO), dicyclomine, ranitidine, Dexlansoprazole, ondansetron, HYDROcodone-homatropine, and doxycycline.  No orders of the defined types were placed in this encounter.    There are no discontinued medications.  Follow-up: No Follow-up on file.   Crecencio Mc, MD

## 2014-12-01 NOTE — Patient Instructions (Addendum)
Try the Heritage Flakes cereal available at [facility] on the bottom shelf, with vanilla flavored almond milk   If you want to lower the carb in your milk,  Try the almond milk [employer] sells an organic almond mik, non refrigerated,  Called :"orgain"  That is fortified with protein and very low ca  Keep a KIND or Atkins Bar hadny at all times to avoid skipping meals   The lowest arb cracker available is the [employer] Protein shake is 160 cal    1 NET CARB.   Return for fasting labs

## 2014-12-02 ENCOUNTER — Other Ambulatory Visit: Payer: Self-pay | Admitting: *Deleted

## 2014-12-02 ENCOUNTER — Other Ambulatory Visit: Payer: 59

## 2014-12-02 DIAGNOSIS — E162 Hypoglycemia, unspecified: Secondary | ICD-10-CM

## 2014-12-03 ENCOUNTER — Encounter: Payer: Self-pay | Admitting: Internal Medicine

## 2014-12-03 DIAGNOSIS — E161 Other hypoglycemia: Secondary | ICD-10-CM | POA: Insufficient documentation

## 2014-12-03 NOTE — Assessment & Plan Note (Addendum)
Secondary to diet which  is full on concentrated sweets.  Diet discussed with patient .  Advised to stop drinking sodas ,  Combine protein with all carbohydrate meals. She will return for fasting labs and a1c   No results found for: HGBA1C

## 2014-12-05 ENCOUNTER — Telehealth: Payer: Self-pay | Admitting: Internal Medicine

## 2014-12-05 LAB — LIPID PANEL
Cholesterol: 176 mg/dL (ref 0–200)
HDL: 57 mg/dL (ref 35–70)
LDL CALC: 105 mg/dL
TRIGLYCERIDES: 72 mg/dL (ref 40–160)

## 2014-12-05 LAB — HEPATIC FUNCTION PANEL
ALT: 11 U/L (ref 7–35)
AST: 16 U/L (ref 13–35)
Alkaline Phosphatase: 62 U/L (ref 25–125)

## 2014-12-05 LAB — BASIC METABOLIC PANEL
BUN: 11 mg/dL (ref 4–21)
CREATININE: 0.6 mg/dL (ref 0.5–1.1)
GLUCOSE: 83 mg/dL
POTASSIUM: 3.9 mmol/L (ref 3.4–5.3)
SODIUM: 140 mmol/L (ref 137–147)

## 2014-12-05 LAB — HEMOGLOBIN A1C: HEMOGLOBIN A1C: 5.3 % (ref 4.0–6.0)

## 2014-12-05 NOTE — Telephone Encounter (Signed)
Please advise, results are not in the  Chart.

## 2014-12-05 NOTE — Telephone Encounter (Signed)
These labs are still showing as need to be collected. But were collected at [facility] and they are faxing over results, notified patient labs will be faxed to office patient requested email results through [facility] chart.

## 2014-12-05 NOTE — Telephone Encounter (Signed)
Pt called about Lab results that was done on 12/02/2014 at [facility] [place]. Thank You!

## 2014-12-12 ENCOUNTER — Encounter: Payer: Self-pay | Admitting: Internal Medicine

## 2014-12-17 ENCOUNTER — Encounter: Payer: Self-pay | Admitting: Internal Medicine

## 2014-12-20 ENCOUNTER — Telehealth: Payer: Self-pay | Admitting: Gastroenterology

## 2014-12-20 ENCOUNTER — Telehealth: Payer: Self-pay | Admitting: Internal Medicine

## 2014-12-20 NOTE — Telephone Encounter (Signed)
Patient called said having the same symptoms as last time before her colonoscopy, per patient when she eats it feels like her insides are on fire Please call patient

## 2014-12-20 NOTE — Telephone Encounter (Signed)
Received labs from [facility] for patient have abstracted and notified patient by email received. Awaiting MD to read in red folder since we have been waiting.

## 2014-12-21 ENCOUNTER — Telehealth: Payer: Self-pay | Admitting: Internal Medicine

## 2014-12-21 NOTE — Telephone Encounter (Signed)
MyChart message sent re labs 

## 2014-12-21 NOTE — Telephone Encounter (Signed)
Please advise 

## 2014-12-26 ENCOUNTER — Encounter: Payer: Self-pay | Admitting: Gastroenterology

## 2014-12-26 ENCOUNTER — Telehealth: Payer: Self-pay

## 2014-12-26 NOTE — Telephone Encounter (Signed)
Called pt regarding her symptoms. Pt stated she felt like she had a stomach ulcer and every time she ate her stomach would hurt. Per Dr. Allen Norris, her stomach doesn't produce acid so taking PPI's will not help her. She is to stop the Dexilant 30mg  and take 3 tbsp of apple cider vinegar three times daily until her symptoms improve. Pt will keep me updated if this works.

## 2014-12-28 ENCOUNTER — Other Ambulatory Visit (INDEPENDENT_AMBULATORY_CARE_PROVIDER_SITE_OTHER): Payer: 59 | Admitting: *Deleted

## 2014-12-28 DIAGNOSIS — R339 Retention of urine, unspecified: Secondary | ICD-10-CM

## 2014-12-28 LAB — POCT URINALYSIS DIPSTICK
Bilirubin, UA: NEGATIVE
Glucose, UA: NEGATIVE
KETONES UA: NEGATIVE
Nitrite, UA: NEGATIVE
PROTEIN UA: NEGATIVE
SPEC GRAV UA: 1.02
UROBILINOGEN UA: 0.2
pH, UA: 7.5

## 2014-12-28 NOTE — Progress Notes (Signed)
Pt c/o inabilty to empty her bladder fully for the past few days and mid-lower pelvic discomfort.

## 2014-12-29 LAB — URINE CULTURE: Colony Count: 5000

## 2014-12-30 ENCOUNTER — Ambulatory Visit: Payer: 59 | Admitting: Family Medicine

## 2015-01-01 ENCOUNTER — Encounter: Payer: Self-pay | Admitting: Gynecology

## 2015-01-01 ENCOUNTER — Ambulatory Visit
Admission: EM | Admit: 2015-01-01 | Discharge: 2015-01-01 | Disposition: A | Discharge status: Home / Self Care | Payer: 59 | Attending: Family Medicine | Admitting: Family Medicine

## 2015-01-01 DIAGNOSIS — J069 Acute upper respiratory infection, unspecified: Secondary | ICD-10-CM | POA: Diagnosis not present

## 2015-01-01 NOTE — ED Notes (Signed)
Patient c/o cough/ fever at home of a 100 / exertion moving around x 1 week.

## 2015-01-01 NOTE — Discharge Instructions (Signed)
Ibuprofen 400-600mg  OR Tylenol as directed Mucinex 1-2 tabs BID PRN Recommend supportive treatment with rest,  Increased fluids  Seek additional medical care if symptoms worsen or are not improving   Upper Respiratory Infection, Adult Most upper respiratory infections (URIs) are a viral infection of the air passages leading to the lungs. A URI affects the nose, throat, and upper air passages. The most common type of URI is nasopharyngitis and is typically referred to as "the common cold." URIs run their course and usually go away on their own. Most of the time, a URI does not require medical attention, but sometimes a bacterial infection in the upper airways can follow a viral infection. This is called a secondary infection. Sinus and middle ear infections are common types of secondary upper respiratory infections. Bacterial pneumonia can also complicate a URI. A URI can worsen asthma and chronic obstructive pulmonary disease (COPD). Sometimes, these complications can require emergency medical care and may be life threatening.  CAUSES Almost all URIs are caused by viruses. A virus is a type of germ and can spread from one person to another.  RISKS FACTORS You may be at risk for a URI if:   You smoke.   You have chronic heart or lung disease.  You have a weakened defense (immune) system.   You are very young or very old.   You have nasal allergies or asthma.  You work in crowded or poorly ventilated areas.  You work in health care facilities or schools. SIGNS AND SYMPTOMS  Symptoms typically develop 2-3 days after you come in contact with a cold virus. Most viral URIs last 7-10 days. However, viral URIs from the influenza virus (flu virus) can last 14-18 days and are typically more severe. Symptoms may include:   Runny or stuffy (congested) nose.   Sneezing.   Cough.   Sore throat.   Headache.   Fatigue.   Fever.   Loss of appetite.   Pain in your forehead,  behind your eyes, and over your cheekbones (sinus pain).  Muscle aches.  DIAGNOSIS  Your health care provider may diagnose a URI by:  Physical exam.  Tests to check that your symptoms are not due to another condition such as:  Strep throat.  Sinusitis.  Pneumonia.  Asthma. TREATMENT  A URI goes away on its own with time. It cannot be cured with medicines, but medicines may be prescribed or recommended to relieve symptoms. Medicines may help:  Reduce your fever.  Reduce your cough.  Relieve nasal congestion. HOME CARE INSTRUCTIONS   Take medicines only as directed by your health care provider.   Gargle warm saltwater or take cough drops to comfort your throat as directed by your health care provider.  Use a warm mist humidifier or inhale steam from a shower to increase air moisture. This may make it easier to breathe.  Drink enough fluid to keep your urine clear or pale yellow.   Eat soups and other clear broths and maintain good nutrition.   Rest as needed.   Return to work when your temperature has returned to normal or as your health care provider advises. You may need to stay home longer to avoid infecting others. You can also use a face mask and careful hand washing to prevent spread of the virus.  Increase the usage of your inhaler if you have asthma.   Do not use any tobacco products, including cigarettes, chewing tobacco, or electronic cigarettes. If you need help quitting,  ask your health care provider. PREVENTION  The best way to protect yourself from getting a cold is to practice good hygiene.   Avoid oral or hand contact with people with cold symptoms.   Wash your hands often if contact occurs.  There is no clear evidence that vitamin C, vitamin E, echinacea, or exercise reduces the chance of developing a cold. However, it is always recommended to get plenty of rest, exercise, and practice good nutrition.  SEEK MEDICAL CARE IF:   You are getting  worse rather than better.   Your symptoms are not controlled by medicine.   You have chills.  You have worsening shortness of breath.  You have brown or red mucus.  You have yellow or brown nasal discharge.  You have pain in your face, especially when you bend forward.  You have a fever.  You have swollen neck glands.  You have pain while swallowing.  You have white areas in the back of your throat. SEEK IMMEDIATE MEDICAL CARE IF:   You have severe or persistent:  Headache.  Ear pain.  Sinus pain.  Chest pain.  You have chronic lung disease and any of the following:  Wheezing.  Prolonged cough.  Coughing up blood.  A change in your usual mucus.  You have a stiff neck.  You have changes in your:  Vision.  Hearing.  Thinking.  Mood. MAKE SURE YOU:   Understand these instructions.  Will watch your condition.  Will get help right away if you are not doing well or get worse.   This information is not intended to replace advice given to you by your health care provider. Make sure you discuss any questions you have with your health care provider.   Document Released: 06/26/2000 Document Revised: 05/17/2014 Document Reviewed: 04/07/2013 Elsevier Interactive Patient Education [employer].

## 2015-01-01 NOTE — ED Provider Notes (Signed)
CSN: [id number]     Arrival date & time 01/01/15  [id number] History   First MD Initiated Contact with Patient 01/01/15 1117     Chief Complaint  Patient presents with  . Cough   HPI Connie Francis is a pleasant 44 y.o. female who presents with cough, low-grade fever, shortness of breath with exertion for the past week. She states she has been having some night sweats and fatigue.  The cough is nonproductive. Shortness of breath is mild only with exertion. O2 sats 99%.  She had a normal chest xray in September.  She denies chest pain, palpitations, diaphoresis, or radiating pain. He has tried ibuprofen and that seems to help some. She denies lower extremity edema.  Nothing seems to make the cough better or worse. She denies ill contacts. Past Medical History  Diagnosis Date  . Obsessive compulsive disorder   . Breast cyst     left breast, follow up with [facility]  . Kidney stones   . Thyroid disease     hyper  . Lump or mass in breast     R-Breast  . Anxiety   . IBS (irritable bowel syndrome)   . GERD (gastroesophageal reflux disease)   . Abdominal bloating   . Chronic epigastric pain   . Constipation   . Migraine   . 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 CHF (congestive heart failure) ([place]) 2006  . Hiatal hernia 2017   Past Surgical History  Procedure Laterality Date  . Dilation and curettage of uterus  123456    complicated by heart failure  . Abdominal hysterectomy  2006    Partial  . Cesarean section    . Upper endoscopy w/ esophageal manometry    . Colonoscopy  2014  . Tubal ligation    . Esophagogastroduodenoscopy (egd) with propofol N/A 07/27/2014    Procedure: ESOPHAGOGASTRODUODENOSCOPY (EGD) WITH PROPOFOL;  Surgeon: Lucilla Lame, MD;  Location: [facility];  Service: Endoscopy;  Laterality: N/A;  . Colonoscopy with propofol N/A 07/27/2014    Procedure: COLONOSCOPY WITH PROPOFOL;  Surgeon: Lucilla Lame, MD;  Location: [facility];  Service: Endoscopy;   Laterality: N/A;  . Breast cyst aspiration Left     negative 2010  . Colonscopy  07/27/2015   Family History  Problem Relation Age of Onset  . Cancer Maternal Aunt     parotid Ca  . Stroke Paternal Aunt   . Cancer Paternal Uncle     lung, nasopharnygeal  . Hearing loss Maternal Grandmother   . Alcohol abuse Maternal Grandmother   . Arthritis Maternal Grandfather   . Colon cancer Neg Hx   . Liver disease Neg Hx   . Breast cancer Neg Hx    Social History  Substance Use Topics  . Smoking status: Never Smoker   . Smokeless tobacco: Never Used  . Alcohol Use: No     Comment: Rare   OB History    Gravida Para Term Preterm AB TAB SAB Ectopic Multiple Living   3    1  1   2       Obstetric Comments   Menstrual: 16 First pregnancy at age 73     Review of Systems  Constitutional: Positive for fever and fatigue.  HENT: Negative.   Eyes: Negative.   Respiratory: Positive for cough and shortness of breath. Negative for apnea, chest tightness, wheezing and stridor.   Cardiovascular: Negative.   Gastrointestinal: Negative.   Endocrine: Negative.   Genitourinary: Negative.  Musculoskeletal: Negative.   Skin: Negative.   Neurological: Negative.   Hematological: Negative.   Psychiatric/Behavioral: Negative.     Allergies  Latex; Penicillins; Promethazine; Shellfish allergy; Sulfa antibiotics; and Lamictal  Home Medications   Prior to Admission medications   Medication Sig Start Date End Date Taking? Authorizing Provider  ALPRAZolam (XANAX) 0.25 MG tablet Take 0.25 mg by mouth 2 (two) times daily as needed for anxiety.    Historical Provider, MD  citalopram (CELEXA) 20 MG tablet Take 10 mg by mouth daily.  10/07/13   Historical Provider, MD  Dexlansoprazole 30 MG capsule Take 1 capsule (30 mg total) by mouth daily. 08/19/14   Lucilla Lame, MD  dicyclomine (BENTYL) 10 MG capsule Take 10 mg by mouth as needed.     Historical Provider, MD  doxycycline (VIBRA-TABS) 100 MG tablet  Take 1 tablet (100 mg total) by mouth 2 (two) times daily. 10/10/14   Coral Spikes, DO  HYDROcodone-homatropine (HYCODAN) 5-1.5 MG/5ML syrup Take 5 mLs by mouth every 8 (eight) hours as needed for cough. 10/10/14   Coral Spikes, DO  ibuprofen (ADVIL,MOTRIN) 600 MG tablet Take 600 mg by mouth every 6 (six) hours as needed.    Historical Provider, MD  ondansetron (ZOFRAN) 4 MG tablet Take 1 tablet (4 mg total) by mouth every 6 (six) hours as needed for nausea or vomiting. 09/22/14   Lucilla Lame, MD  Probiotic Product (VSL#3 PO) Take 1 capsule by mouth 2 (two) times daily.    Historical Provider, MD  ranitidine (ZANTAC) 150 MG tablet Take 1 tablet (150 mg total) by mouth 2 (two) times daily. 07/20/14   Andria Meuse, NP  sertraline (ZOLOFT) 50 MG tablet Take 50 mg by mouth daily.    Historical Provider, MD   Meds Ordered and Administered this Visit  Medications - No data to display  BP 142/86 mmHg  Pulse 71  Temp(Src) 97.3 F (36.3 C) (Oral)  Resp 16  Ht 5\' 2"  (1.575 m)  Wt 121 lb (54.885 kg)  BMI 22.13 kg/m2  SpO2 99% No data found.   Physical Exam  ED Course  Procedures n/a   MDM   1. URI, acute    Explained to patient this is likely viral URI. Cough and symptoms may persist for up to 3 weeks at times. Continue supportive measures. Reassured and discussed warning signs for patient to return to clinic including SOB, weakness, unrelenting fever, severe pain or worsening symptoms.  Patient agrees with plan.  Plan: Diagnosis reviewed with patient/mother Ibuprofen 400-600mg  TID PRN or Tylenol PRN Mucinex 1-2 tabs BID PRN Recommend supportive treatment with rest,  Increased fluids Seek additional medical care if symptoms worsen or are not improving     Andria Meuse, NP 01/01/15 1136

## 2015-01-02 ENCOUNTER — Ambulatory Visit (INDEPENDENT_AMBULATORY_CARE_PROVIDER_SITE_OTHER): Payer: 59 | Admitting: Family Medicine

## 2015-01-02 ENCOUNTER — Encounter: Payer: Self-pay | Admitting: Family Medicine

## 2015-01-02 VITALS — BP 138/82 | HR 80 | Wt 125.0 lb

## 2015-01-02 DIAGNOSIS — R102 Pelvic and perineal pain: Secondary | ICD-10-CM | POA: Diagnosis not present

## 2015-01-02 NOTE — Progress Notes (Signed)
    Subjective:    Patient ID: Connie Francis is a 44 y.o. female presenting with Pelvic Pain  on 01/02/2015  HPI: Pelvic pressure and pain with intercourse and with urinating. Feels bloated. Reports symptoms x 1 year.  She is s/p TVH with both ovaries and tubes remain.  She has had CT abd, upper EDG and colonoscopy which were normal except for atrophic gastritis. Began taking colace twice daily, but got excessive. Pain is in lower abdomen and radiates to the back. Pain is better with lying flat. No goods that make it worse.  Has been to GI, and kept food diary. Tested for sprue and that came back negative. Am nausea. All symptoms began within one year after hysterectomy. Feels like there is some gut spasms. Just does not feel well and wants to feel better.  She is not taking her anti-depressants as prescribed.  Review of Systems  Constitutional: Negative for fever and chills.  Respiratory: Negative for shortness of breath.   Cardiovascular: Negative for chest pain.  Gastrointestinal: Positive for nausea and abdominal pain. Negative for vomiting.  Genitourinary: Negative for dysuria and menstrual problem.  Skin: Negative for rash.      Objective:    BP 138/82 mmHg  Pulse 80  Wt 125 lb (56.7 kg) Physical Exam  Constitutional: She is oriented to person, place, and time. She appears well-developed and well-nourished. No distress.  HENT:  Head: Normocephalic and atraumatic.  Eyes: No scleral icterus.  Neck: Neck supple.  Cardiovascular: Normal rate.   Pulmonary/Chest: Effort normal.  Abdominal: Soft.  Genitourinary: Right adnexum displays tenderness. Right adnexum displays no mass. Left adnexum displays tenderness. Left adnexum displays no mass.  Uterus and cervix are absent.  Neurological: She is alert and oriented to person, place, and time.  Skin: Skin is warm and dry.  Psychiatric: She has a normal mood and affect.   CT pelvis Reproductive: Uterus is absent. 3.5 cm long tubular  fluid-filled structure left adnexa measuring 12 mm in diameter with average attenuation value of 1. This is stable from 2014 and likely represents either a postoperative seroma or a chronically dilated fallopian tube. In the right adnexa, there is a similar finding which is also stable from 2014. There is a 1-2 mm focus of calcification in the right adnexa which is stable from 2015 and likely not of acute clinical consequence.     Assessment & Plan:   Problem List Items Addressed This Visit    None    Visit Diagnoses    Pelvic pain in female    -  Primary    Unclear etiology with large amount of prior w/u.  Will get pelvic sono to look at tubes and ovaries.  Discussed hydrosalpinx. Mental state may be playing a role    Relevant Orders    US Pelvis Complete    US Transvaginal Non-OB       Total face-to-face time with patient: 30 minutes. Over 50% of encounter was spent on counseling and coordination of care. Return in about 2 months (around 03/05/2015).  Argelia Formisano S 01/02/2015 1:49 PM

## 2015-01-02 NOTE — Patient Instructions (Signed)

## 2015-01-05 ENCOUNTER — Telehealth: Payer: 59 | Admitting: Physician Assistant

## 2015-01-05 DIAGNOSIS — J019 Acute sinusitis, unspecified: Secondary | ICD-10-CM

## 2015-01-05 DIAGNOSIS — B9689 Other specified bacterial agents as the cause of diseases classified elsewhere: Secondary | ICD-10-CM

## 2015-01-05 MED ORDER — DOXYCYCLINE HYCLATE 100 MG PO CAPS: 100.0000 mg | ORAL_CAPSULE | Freq: Two times a day (BID) | ORAL | Status: DC

## 2015-01-05 NOTE — Progress Notes (Signed)

## 2015-01-06 ENCOUNTER — Ambulatory Visit
Admission: RE | Admit: 2015-01-06 | Discharge: 2015-01-06 | Disposition: A | Discharge status: Home / Self Care | Payer: 59 | Source: Ambulatory Visit | Attending: Family Medicine | Admitting: Family Medicine

## 2015-01-06 DIAGNOSIS — R102 Pelvic and perineal pain: Secondary | ICD-10-CM | POA: Diagnosis present

## 2015-01-06 DIAGNOSIS — N7011 Chronic salpingitis: Secondary | ICD-10-CM | POA: Insufficient documentation

## 2015-01-11 ENCOUNTER — Encounter: Payer: Self-pay | Admitting: Gastroenterology

## 2015-01-12 ENCOUNTER — Encounter: Payer: Self-pay | Admitting: Internal Medicine

## 2015-01-15 ENCOUNTER — Ambulatory Visit
Admission: EM | Admit: 2015-01-15 | Discharge: 2015-01-15 | Disposition: A | Discharge status: Home / Self Care | Payer: 59 | Attending: Family Medicine | Admitting: Family Medicine

## 2015-01-15 DIAGNOSIS — K449 Diaphragmatic hernia without obstruction or gangrene: Secondary | ICD-10-CM

## 2015-01-15 DIAGNOSIS — J018 Other acute sinusitis: Secondary | ICD-10-CM | POA: Diagnosis not present

## 2015-01-15 HISTORY — DX: Diaphragmatic hernia without obstruction or gangrene: K44.9

## 2015-01-15 LAB — RAPID STREP SCREEN (MED CTR MEBANE ONLY): STREPTOCOCCUS, GROUP A SCREEN (DIRECT): NEGATIVE

## 2015-01-15 MED ORDER — LEVOFLOXACIN 500 MG PO TABS: 500.0000 mg | ORAL_TABLET | Freq: Every day | ORAL | Status: DC

## 2015-01-15 NOTE — ED Notes (Signed)
Pt c/o cough and fever (up to 99.5), as well as sore throat. Pt reports she was on a Z Pak, sx improved. Pt completed course of abx, and sx have returned.

## 2015-01-15 NOTE — ED Provider Notes (Signed)
History presents today with symptoms of mild productive cough, low-grade fever, sore throat, nasal congestion, sinus pressure. Patient states that she was put on doxycycline for her symptoms which improved her symptoms but after her course of the doxycycline her symptoms came back. She denies any facial swelling, night sweats, chest pain, shortness of breath, severe headache, neck stiffness, photophobia, nausea, vomiting, diarrhea, abdominal pain. She's not been taking any other medications for her symptoms.   ROS: Negative except mentioned above.  Vitals as per Epic. GENERAL: NAD HEENT: mild pharyngeal erythema, no exudate, no erythema of TMs, no cervical LAD RESP: CTA B CARD: RRR NEURO: CN II-XII  grossly intact   A/P: Sinusitis- Will treat with Levaquin(discussed risk of QT issues with Celexa), recommend patient take Claritin-D when necessary, Flonase when necessary, Delsym when necessary, Tylenol/Ibuprofen when necessary, rest, hydration, seek medical attention if symptoms persist or worsen as discussed.   Paulina Fusi, MD 01/15/15 [phone]

## 2015-01-15 NOTE — ED Notes (Signed)
Addendum: Pt was taking Doxy, not Z Pak.

## 2015-01-17 LAB — CULTURE, GROUP A STREP (THRC)

## 2015-01-20 ENCOUNTER — Ambulatory Visit: Payer: Self-pay | Admitting: Internal Medicine

## 2015-01-20 DIAGNOSIS — N281 Cyst of kidney, acquired: Secondary | ICD-10-CM | POA: Diagnosis not present

## 2015-01-20 DIAGNOSIS — R3989 Other symptoms and signs involving the genitourinary system: Secondary | ICD-10-CM | POA: Diagnosis not present

## 2015-01-20 DIAGNOSIS — N200 Calculus of kidney: Secondary | ICD-10-CM | POA: Diagnosis not present

## 2015-01-20 DIAGNOSIS — R3915 Urgency of urination: Secondary | ICD-10-CM | POA: Diagnosis not present

## 2015-01-20 DIAGNOSIS — R339 Retention of urine, unspecified: Secondary | ICD-10-CM | POA: Diagnosis not present

## 2015-01-23 ENCOUNTER — Other Ambulatory Visit: Payer: 59 | Admitting: *Deleted

## 2015-01-23 DIAGNOSIS — N898 Other specified noninflammatory disorders of vagina: Secondary | ICD-10-CM

## 2015-01-23 NOTE — Progress Notes (Signed)
Patient having vaginal discharge and thinks she may have a yeast infection.  I will send off wet prep.

## 2015-01-24 ENCOUNTER — Telehealth: Payer: Self-pay | Admitting: *Deleted

## 2015-01-24 DIAGNOSIS — B9689 Other specified bacterial agents as the cause of diseases classified elsewhere: Secondary | ICD-10-CM

## 2015-01-24 DIAGNOSIS — N760 Acute vaginitis: Principal | ICD-10-CM

## 2015-01-24 LAB — WET PREP, GENITAL
Trich, Wet Prep: NONE SEEN
YEAST WET PREP: NONE SEEN

## 2015-01-24 MED ORDER — METRONIDAZOLE 500 MG PO TABS: 500.0000 mg | ORAL_TABLET | Freq: Two times a day (BID) | ORAL | Status: DC

## 2015-01-24 NOTE — Telephone Encounter (Signed)
Patient wet prep showed clue cells which indicated bacterial vaginosis, which goes along with the patients symptoms.  I have sent in Flagyl to patient pharmacy. Patient aware.

## 2015-01-26 DIAGNOSIS — R109 Unspecified abdominal pain: Secondary | ICD-10-CM | POA: Diagnosis not present

## 2015-01-26 DIAGNOSIS — N200 Calculus of kidney: Secondary | ICD-10-CM | POA: Diagnosis not present

## 2015-01-26 DIAGNOSIS — I878 Other specified disorders of veins: Secondary | ICD-10-CM | POA: Diagnosis not present

## 2015-01-26 DIAGNOSIS — N281 Cyst of kidney, acquired: Secondary | ICD-10-CM | POA: Diagnosis not present

## 2015-02-03 ENCOUNTER — Other Ambulatory Visit: Payer: 59 | Admitting: *Deleted

## 2015-02-03 DIAGNOSIS — N898 Other specified noninflammatory disorders of vagina: Secondary | ICD-10-CM

## 2015-02-03 NOTE — Progress Notes (Signed)
Patient having vaginal discharge.  I will send off a wet prep.

## 2015-02-04 LAB — WET PREP, GENITAL
TRICH WET PREP: NONE SEEN
YEAST WET PREP: NONE SEEN

## 2015-02-06 ENCOUNTER — Telehealth: Payer: Self-pay | Admitting: *Deleted

## 2015-02-06 DIAGNOSIS — B9689 Other specified bacterial agents as the cause of diseases classified elsewhere: Secondary | ICD-10-CM

## 2015-02-06 DIAGNOSIS — N760 Acute vaginitis: Principal | ICD-10-CM

## 2015-02-06 MED ORDER — METRONIDAZOLE 0.75 % VA GEL: 1.0000 | Freq: Every day | VAGINAL | Status: DC

## 2015-02-06 NOTE — Telephone Encounter (Signed)
Pt unable to complete prescription for Flagyl taken by mouth because it upset her stomach, sent rx for Metrogel to pharmacy to treat BV.

## 2015-02-15 DIAGNOSIS — K5900 Constipation, unspecified: Secondary | ICD-10-CM | POA: Diagnosis not present

## 2015-02-15 DIAGNOSIS — Z8719 Personal history of other diseases of the digestive system: Secondary | ICD-10-CM | POA: Diagnosis not present

## 2015-02-15 DIAGNOSIS — K581 Irritable bowel syndrome with constipation: Secondary | ICD-10-CM | POA: Diagnosis not present

## 2015-03-02 DIAGNOSIS — R350 Frequency of micturition: Secondary | ICD-10-CM | POA: Diagnosis not present

## 2015-04-19 DIAGNOSIS — K581 Irritable bowel syndrome with constipation: Secondary | ICD-10-CM | POA: Diagnosis not present

## 2015-04-19 DIAGNOSIS — R140 Abdominal distension (gaseous): Secondary | ICD-10-CM | POA: Diagnosis not present

## 2015-04-21 DIAGNOSIS — F4001 Agoraphobia with panic disorder: Secondary | ICD-10-CM | POA: Diagnosis not present

## 2015-04-21 DIAGNOSIS — F4312 Post-traumatic stress disorder, chronic: Secondary | ICD-10-CM | POA: Diagnosis not present

## 2015-04-21 DIAGNOSIS — F330 Major depressive disorder, recurrent, mild: Secondary | ICD-10-CM | POA: Diagnosis not present

## 2015-04-21 DIAGNOSIS — F411 Generalized anxiety disorder: Secondary | ICD-10-CM | POA: Diagnosis not present

## 2015-05-24 DIAGNOSIS — K581 Irritable bowel syndrome with constipation: Secondary | ICD-10-CM | POA: Diagnosis not present

## 2015-05-24 DIAGNOSIS — K5909 Other constipation: Secondary | ICD-10-CM | POA: Diagnosis not present

## 2015-05-24 DIAGNOSIS — R140 Abdominal distension (gaseous): Secondary | ICD-10-CM | POA: Diagnosis not present

## 2015-07-20 ENCOUNTER — Encounter: Payer: Self-pay | Admitting: Internal Medicine

## 2015-07-20 ENCOUNTER — Ambulatory Visit (INDEPENDENT_AMBULATORY_CARE_PROVIDER_SITE_OTHER): Payer: 59 | Admitting: Internal Medicine

## 2015-07-20 VITALS — BP 136/84 | HR 70 | Temp 98.0°F | Resp 12 | Ht 62.0 in | Wt 131.8 lb

## 2015-07-20 DIAGNOSIS — R635 Abnormal weight gain: Secondary | ICD-10-CM

## 2015-07-20 DIAGNOSIS — Z0000 Encounter for general adult medical examination without abnormal findings: Secondary | ICD-10-CM

## 2015-07-20 DIAGNOSIS — R030 Elevated blood-pressure reading, without diagnosis of hypertension: Secondary | ICD-10-CM

## 2015-07-20 DIAGNOSIS — E161 Other hypoglycemia: Secondary | ICD-10-CM

## 2015-07-20 DIAGNOSIS — R5383 Other fatigue: Secondary | ICD-10-CM

## 2015-07-20 DIAGNOSIS — E162 Hypoglycemia, unspecified: Secondary | ICD-10-CM | POA: Diagnosis not present

## 2015-07-20 DIAGNOSIS — IMO0001 Reserved for inherently not codable concepts without codable children: Secondary | ICD-10-CM

## 2015-07-20 DIAGNOSIS — E559 Vitamin D deficiency, unspecified: Secondary | ICD-10-CM | POA: Diagnosis not present

## 2015-07-20 DIAGNOSIS — F418 Other specified anxiety disorders: Secondary | ICD-10-CM

## 2015-07-20 LAB — COMPREHENSIVE METABOLIC PANEL
ALK PHOS: 68 U/L (ref 39–117)
ALT: 11 U/L (ref 0–35)
AST: 15 U/L (ref 0–37)
Albumin: 4.2 g/dL (ref 3.5–5.2)
BILIRUBIN TOTAL: 0.4 mg/dL (ref 0.2–1.2)
BUN: 12 mg/dL (ref 6–23)
CALCIUM: 9.4 mg/dL (ref 8.4–10.5)
CO2: 28 mEq/L (ref 19–32)
CREATININE: 0.66 mg/dL (ref 0.40–1.20)
Chloride: 104 mEq/L (ref 96–112)
GFR: 103.92 mL/min (ref >60.00)
GLUCOSE: 90 mg/dL (ref 70–99)
Potassium: 4 mEq/L (ref 3.5–5.1)
Sodium: 138 mEq/L (ref 135–145)
TOTAL PROTEIN: 7.1 g/dL (ref 6.0–8.3)

## 2015-07-20 LAB — CBC WITH DIFFERENTIAL/PLATELET
BASOS ABS: 0 10*3/uL (ref 0.0–0.1)
Basophils Relative: 0.2 % (ref 0.0–3.0)
EOS ABS: 0 10*3/uL (ref 0.0–0.7)
Eosinophils Relative: 0.6 % (ref 0.0–5.0)
HEMATOCRIT: 38 % (ref 36.0–46.0)
Hemoglobin: 12.7 g/dL (ref 12.0–15.0)
LYMPHS PCT: 30.5 % (ref 12.0–46.0)
Lymphs Abs: 1.4 10*3/uL (ref 0.7–4.0)
MCHC: 33.5 g/dL (ref 30.0–36.0)
MCV: 90.1 fl (ref 78.0–100.0)
MONO ABS: 0.4 10*3/uL (ref 0.1–1.0)
Monocytes Relative: 8.7 % (ref 3.0–12.0)
NEUTROS ABS: 2.8 10*3/uL (ref 1.4–7.7)
NEUTROS PCT: 60 % (ref 43.0–77.0)
PLATELETS: 282 10*3/uL (ref 150.0–400.0)
RBC: 4.22 Mil/uL (ref 3.87–5.11)
RDW: 12.9 % (ref 11.5–15.5)
WBC: 4.7 10*3/uL (ref 4.0–10.5)

## 2015-07-20 LAB — LIPID PANEL
Cholesterol: 188 mg/dL (ref 0–200)
HDL: 67.5 mg/dL (ref >39.00)
LDL Cholesterol: 110 mg/dL — ABNORMAL HIGH (ref 0–99)
NonHDL: 120.41
Total CHOL/HDL Ratio: 3
Triglycerides: 53 mg/dL (ref 0.0–149.0)
VLDL: 10.6 mg/dL (ref 0.0–40.0)

## 2015-07-20 LAB — HEMOGLOBIN A1C: Hgb A1c MFr Bld: 4.8 % (ref 4.6–6.5)

## 2015-07-20 LAB — TSH: TSH: 0.68 u[IU]/mL (ref 0.35–4.50)

## 2015-07-20 LAB — VITAMIN D 25 HYDROXY (VIT D DEFICIENCY, FRACTURES): VITD: 40.24 ng/mL (ref 30.00–100.00)

## 2015-07-20 NOTE — Progress Notes (Signed)
Patient ID: Connie Francis, female    DOB: [birth year]  Age: 45 y.o. MRN: [id number]  The patient is here for annual non gyn wellness examination and management of other chronic and acute problems.    Gyn exam done annually by [facility] along with annual breast exam.  She is s/p hysterectomy   The risk factors are reflected in the social history.  The roster of all physicians providing medical care to patient - is listed in the Snapshot section of the chart.  Home safety : The patient has smoke detectors in the home. They wear seatbelts.  There are no firearms at home. There is no violence in the home.   There is no risks for hepatitis, STDs or HIV. There is no   history of blood transfusion. They have no travel history to infectious disease endemic areas of the world.  The patient has seen their dentist in the last six month. They have seen their eye doctor in the last year.   They do not  have excessive sun exposure. Discussed the need for sun protection: hats, long sleeves and use of sunscreen if there is significant sun exposure.   Diet: the importance of a healthy diet is discussed. They do have a healthy diet.  The benefits of regular aerobic exercise were discussed. She walks 4 times per week ,  20 minutes.   Depression screen: there are no signs or vegative symptoms of depression- irritability, change in appetite, anhedonia, sadness/tearfullness.   The following portions of the patient's history were reviewed and updated as appropriate: allergies, current medications, past family history, past medical history,  past surgical history, past social history  and problem list.  Visual acuity was not assessed per patient preference since she has regular follow up with her ophthalmologist. Hearing and body mass index were assessed and reviewed.   During the course of the visit the patient was educated and counseled about appropriate screening and preventive services including : fall  prevention , diabetes screening, nutrition counseling, colorectal cancer screening, and recommended immunizations.    CC: The primary encounter diagnosis was Vitamin D deficiency. Diagnoses of Reactive hypoglycemia, Weight gain, Other fatigue, Depression with anxiety, Visit for preventive health examination, and Elevated blood pressure were also pertinent to this visit.   She is concerned about her weight gain.  She is not exercising regularly.  She does try to follow a sensible diet, but continues to drink 3 bottle sof Pepsi daly, which is reduction from 10 last year.  She continues to have infrequent episodes of subjective reactive hypoglycemia .    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   She has past surgical history that includes Dilation and curettage of uterus (2006); Cesarean section; Upper endoscopy w/ esophageal manometry; Tubal ligation; Esophagogastroduodenoscopy (egd) with propofol (N/A, 07/27/2014); Colonoscopy with propofol (N/A, 07/27/2014); Breast cyst aspiration (Left); colonscopy (07/27/2015); and Vaginal hysterectomy.   Her family history includes Alcohol abuse in her maternal grandmother; Arthritis in her maternal grandfather; Cancer in her maternal aunt and paternal uncle; Hearing loss in her maternal grandmother; Stroke in her paternal aunt. There is no history of Colon cancer, Liver disease, or Breast cancer.She reports that she has never smoked. She has never  used smokeless tobacco. She reports that she does not drink alcohol or use illicit drugs.  Outpatient Prescriptions Prior to Visit  Medication Sig Dispense  Refill  . ALPRAZolam (XANAX) 0.25 MG tablet Take 0.25 mg by mouth 2 (two) times daily as needed for anxiety.    . citalopram (CELEXA) 20 MG tablet Take 10 mg by mouth daily.   2  . sertraline (ZOLOFT) 50 MG tablet Take 50 mg by mouth daily.    Marland Kitchen dicyclomine (BENTYL) 10 MG capsule Take 10 mg by mouth as needed. Reported on 07/20/2015    . doxycycline (VIBRAMYCIN) 100 MG capsule Take 1 capsule (100 mg total) by mouth 2 (two) times daily. (Patient not taking: Reported on 07/20/2015) 20 capsule 0  . levofloxacin (LEVAQUIN) 500 MG tablet Take 1 tablet (500 mg total) by mouth daily. (Patient not taking: Reported on 07/20/2015) 7 tablet 0  . metroNIDAZOLE (FLAGYL) 500 MG tablet Take 1 tablet (500 mg total) by mouth 2 (two) times daily. (Patient not taking: Reported on 07/20/2015) 14 tablet 0  . metroNIDAZOLE (METROGEL) 0.75 % vaginal gel Place 1 Applicatorful vaginally at bedtime. Apply one applicatorful to vagina at bedtime for 5 days (Patient not taking: Reported on 07/20/2015) 70 g 0   No facility-administered medications prior to visit.    Review of Systems   Patient denies headache, fevers, malaise, unintentional weight loss, skin rash, eye pain, sinus congestion and sinus pain, sore throat, dysphagia,  hemoptysis , cough, dyspnea, wheezing, chest pain, palpitations, orthopnea, edema, abdominal pain, nausea, melena, diarrhea, constipation, flank pain, dysuria, hematuria, urinary  Frequency, nocturia, numbness, tingling, seizures,  Focal weakness, Loss of consciousness,  Tremor, insomnia, depression, anxiety, and suicidal ideation.      Objective:  BP 136/84 mmHg  Pulse 70  Temp(Src) 98 F (36.7 C) (Oral)  Resp 12  Ht 5\' 2"  (1.575 m)  Wt 131 lb 12 oz (59.761 kg)  BMI 24.09 kg/m2  SpO2 98%  Physical Exam   General appearance: alert, cooperative and appears stated age Ears: normal TM's and external ear canals both ears Throat: lips, mucosa, and tongue normal; teeth and gums normal Neck: no  adenopathy, no carotid bruit, supple, symmetrical, trachea midline and thyroid not enlarged, symmetric, no tenderness/mass/nodules Back: symmetric, no curvature. ROM normal. No CVA tenderness. Lungs: clear to auscultation bilaterally Heart: regular rate and rhythm, S1, S2 normal, no murmur, click, rub or gallop Abdomen: soft, non-tender; bowel sounds normal; no masses,  no organomegaly Pulses: 2+ and symmetric Skin: Skin color, texture, turgor normal. No rashes or lesions Lymph nodes: Cervical, supraclavicular, and axillary nodes normal.    Assessment & Plan:   Problem List Items Addressed This Visit    Depression with anxiety    Controlled with citalopramzoloft, and alprazolam managed by  Dr. Nicolasa Ducking.         Visit for preventive health examination    Annual comprehensive preventive exam was done as well as an evaluation and management of chronic conditions .  During the course of the visit the patient was educated and counseled about appropriate screening and preventive services including :  diabetes screening, lipid analysis with projected  10 year  risk for CAD , nutrition counseling, breast, cervical and colorectal cancer screening, and recommended immunizations.  Printed recommendations for health maintenance screenings was given.  Low glycemic index diet was recommended along with regular exercise..      Elevated blood pressure    Secondary to white coat  Hypertension.  BP in other  settings has been checked and are consistently < 130/80      Reactive hypoglycemia    Secondary to diet which  is full on concentrated sweets.  Diet discussed with patient .  Advised to stop drinking sodas ,  Combine protein with all carbohydrate meals. She has a normal  fasting glucose and a1c .  Lab Results  Component Value Date   HGBA1C 4.8 07/20/2015   Lab Results  Component Value Date   NA 138 07/20/2015   K 4.0 07/20/2015   CL 104 07/20/2015   CO2 28 07/20/2015           Relevant  Orders   Hemoglobin A1c (Completed)    Other Visit Diagnoses    Vitamin D deficiency    -  Primary    Relevant Orders    VITAMIN D 25 Hydroxy (Vit-D Deficiency, Fractures) (Completed)    Weight gain        Relevant Orders    Comprehensive metabolic panel (Completed)    Lipid panel (Completed)    TSH (Completed)    Other fatigue        Relevant Orders    CBC with Differential/Platelet (Completed)       I have discontinued Ms. Wilkerson's doxycycline, levofloxacin, metroNIDAZOLE, and metroNIDAZOLE. I am also having her maintain her ALPRAZolam, sertraline, citalopram, and dicyclomine.  No orders of the defined types were placed in this encounter.    Medications Discontinued During This Encounter  Medication Reason  . doxycycline (VIBRAMYCIN) 100 MG capsule   . levofloxacin (LEVAQUIN) 500 MG tablet   . metroNIDAZOLE (METROGEL) 0.75 % vaginal gel   . metroNIDAZOLE (FLAGYL) 500 MG tablet     Follow-up: Return in about 1 year (around 07/19/2016).   Crecencio Mc, MD

## 2015-07-20 NOTE — Patient Instructions (Signed)
Your weight is fine,  But I appreciate your concern about the trend  FIND A WAY TO GET OFF THE PEPSI DRINKS AND YOU'LL LOSE WEIGHT   To make a low carb chip :  Take the Joseph's Lavash or Pita bread,  Or the [facility] Low carb whole wheat tortilla   Place on metal cookie sheet  Brush with olive oil  Sprinkle garlic powder (NOT garlic salt), grated parmesan cheese, mediterranean seasoning , or all of them?  Bake at 275 for 30 minutes   We have substitutions for your potatoes!!  Try the mashed cauliflower and riced cauliflower dishes instead of rice and mashed potatoes  Mashed turnips are also very low carb!   For desserts :  Try the Dannon Lt n Fit greek yogurt dessert flavors and top with reddi Whip .  8 carbs,  80 calories  Try Oikos Triple Zero [place] Yogurt in the salted caramel, and the coffee flavors  With Whipped Cream for dessert  breyer's low carb ice cream, available in bars (on a stick, better ) or scoopable ice cream  Low carb ice cream  Has become more popular and there are many more choices :  Halo Top  E enlightenment  Yazzo  frozen yogurt   Skinny Cow  Weight watchers    HERE ARE THE LOW CARB  BREAD CHOICES

## 2015-07-20 NOTE — Progress Notes (Signed)
Pre-visit discussion using our clinic review tool. No additional management support is needed unless otherwise documented below in the visit note.  

## 2015-07-21 ENCOUNTER — Encounter: Payer: Self-pay | Admitting: Internal Medicine

## 2015-07-22 NOTE — Assessment & Plan Note (Signed)
Secondary to white coat  Hypertension.  BP in other settings has been checked and are consistently < 130/80

## 2015-07-22 NOTE — Assessment & Plan Note (Signed)
Secondary to diet which  is full on concentrated sweets.  Diet discussed with patient .  Advised to stop drinking sodas ,  Combine protein with all carbohydrate meals. She has a normal  fasting glucose and a1c .  Lab Results  Component Value Date   HGBA1C 4.8 07/20/2015   Lab Results  Component Value Date   NA 138 07/20/2015   K 4.0 07/20/2015   CL 104 07/20/2015   CO2 28 07/20/2015

## 2015-07-22 NOTE — Assessment & Plan Note (Signed)
Controlled with citalopramzoloft, and alprazolam managed by  Dr. Nicolasa Ducking.

## 2015-07-22 NOTE — Assessment & Plan Note (Signed)
Annual comprehensive preventive exam was done as well as an evaluation and management of chronic conditions .  During the course of the visit the patient was educated and counseled about appropriate screening and preventive services including :  diabetes screening, lipid analysis with projected  10 year  risk for CAD , nutrition counseling, breast, cervical and colorectal cancer screening, and recommended immunizations.  Printed recommendations for health maintenance screenings was given.  Low glycemic index diet was recommended along with regular exercise.Marland Kitchen

## 2015-07-27 ENCOUNTER — Encounter: Payer: Self-pay | Admitting: *Deleted

## 2015-07-27 ENCOUNTER — Encounter: Payer: Self-pay | Admitting: General Surgery

## 2015-07-27 ENCOUNTER — Other Ambulatory Visit: Payer: Self-pay | Admitting: General Surgery

## 2015-07-27 DIAGNOSIS — Z1231 Encounter for screening mammogram for malignant neoplasm of breast: Secondary | ICD-10-CM

## 2015-07-27 HISTORY — PX: OTHER SURGICAL HISTORY: SHX169

## 2015-07-31 ENCOUNTER — Other Ambulatory Visit: Payer: 59 | Admitting: *Deleted

## 2015-07-31 DIAGNOSIS — N949 Unspecified condition associated with female genital organs and menstrual cycle: Secondary | ICD-10-CM | POA: Diagnosis not present

## 2015-08-01 ENCOUNTER — Telehealth: Payer: Self-pay | Admitting: *Deleted

## 2015-08-01 DIAGNOSIS — B9689 Other specified bacterial agents as the cause of diseases classified elsewhere: Secondary | ICD-10-CM

## 2015-08-01 DIAGNOSIS — N760 Acute vaginitis: Principal | ICD-10-CM

## 2015-08-01 LAB — WET PREP, GENITAL
TRICH WET PREP: NONE SEEN
WBC WET PREP: NONE SEEN
YEAST WET PREP: NONE SEEN

## 2015-08-01 MED ORDER — METRONIDAZOLE 500 MG PO TABS: 500.0000 mg | ORAL_TABLET | Freq: Two times a day (BID) | ORAL | Status: DC

## 2015-08-01 NOTE — Telephone Encounter (Signed)
Sent Flagyl to pharm for BV

## 2015-08-09 ENCOUNTER — Telehealth: Payer: Self-pay | Admitting: *Deleted

## 2015-08-09 MED ORDER — FLUCONAZOLE 150 MG PO TABS: 150.0000 mg | ORAL_TABLET | ORAL | 3 refills | Status: DC

## 2015-08-09 NOTE — Telephone Encounter (Signed)
Vaginal yeast after taking abx

## 2015-08-24 DIAGNOSIS — K581 Irritable bowel syndrome with constipation: Secondary | ICD-10-CM | POA: Diagnosis not present

## 2015-08-24 DIAGNOSIS — K5909 Other constipation: Secondary | ICD-10-CM | POA: Diagnosis not present

## 2015-08-24 DIAGNOSIS — R140 Abdominal distension (gaseous): Secondary | ICD-10-CM | POA: Diagnosis not present

## 2015-09-15 ENCOUNTER — Other Ambulatory Visit: Payer: Self-pay | Admitting: General Surgery

## 2015-09-15 ENCOUNTER — Ambulatory Visit
Admission: RE | Admit: 2015-09-15 | Discharge: 2015-09-15 | Disposition: A | Discharge status: Home / Self Care | Payer: 59 | Source: Ambulatory Visit | Attending: General Surgery | Admitting: General Surgery

## 2015-09-15 DIAGNOSIS — Z1231 Encounter for screening mammogram for malignant neoplasm of breast: Secondary | ICD-10-CM

## 2015-09-20 ENCOUNTER — Ambulatory Visit (INDEPENDENT_AMBULATORY_CARE_PROVIDER_SITE_OTHER): Payer: 59 | Admitting: General Surgery

## 2015-09-20 ENCOUNTER — Encounter: Payer: Self-pay | Admitting: General Surgery

## 2015-09-20 VITALS — BP 102/62 | HR 60 | Resp 12 | Ht 62.0 in | Wt 132.0 lb

## 2015-09-20 DIAGNOSIS — Z872 Personal history of diseases of the skin and subcutaneous tissue: Secondary | ICD-10-CM

## 2015-09-20 NOTE — Progress Notes (Signed)
Patient ID: Connie Francis, female   DOB: [birth year], 45 y.o.   MRN: [id number]  Chief Complaint  Patient presents with  . Follow-up    mammogram    HPI Connie Francis is a 45 y.o. female.  who presents for a breast evaluation. The most recent mammogram was done on 09-15-15 .  Patient does perform regular self breast checks and gets regular mammograms done.  No new breast complaints. She has had an upper endoscopy and colonoscopy completed and was diagnosed with atrophic gastritis last year and is followed by Laurine Blazer PA at [facility]. I have reviewed the history of present illness with the patient.   HPI  Past Medical History:  Diagnosis Date  . Abdominal bloating   . Anxiety   . Breast cyst    left breast, follow up with Sankar  . 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 CHF (congestive heart failure) ([place]) 2006  . Chronic epigastric pain   . Constipation   . GERD (gastroesophageal reflux disease)   . Hiatal hernia 2017  . IBS (irritable bowel syndrome)   . Kidney stones   . Lump or mass in breast    R-Breast  . Migraine   . Obsessive compulsive disorder   . Thyroid disease    hyper    Past Surgical History:  Procedure Laterality Date  . BREAST CYST ASPIRATION Left    negative 2010  . CESAREAN SECTION    . COLONOSCOPY WITH PROPOFOL N/A 07/27/2014   Procedure: COLONOSCOPY WITH PROPOFOL;  Surgeon: Lucilla Lame, MD;  Location: [facility];  Service: Endoscopy;  Laterality: N/A;  . colonscopy  07/27/2015  . DILATION AND CURETTAGE OF UTERUS  123456   complicated by heart failure  . ESOPHAGOGASTRODUODENOSCOPY (EGD) WITH PROPOFOL N/A 07/27/2014   Procedure: ESOPHAGOGASTRODUODENOSCOPY (EGD) WITH PROPOFOL;  Surgeon: Lucilla Lame, MD;  Location: [facility];  Service: Endoscopy;  Laterality: N/A;  . TUBAL LIGATION    . UPPER ENDOSCOPY W/ ESOPHAGEAL MANOMETRY    . VAGINAL HYSTERECTOMY      Family History  Problem Relation Age of Onset  . Cancer  Maternal Aunt     parotid Ca  . Hearing loss Maternal Grandmother   . Alcohol abuse Maternal Grandmother   . Stroke Paternal Aunt   . Cancer Paternal Uncle     lung, nasopharnygeal  . Arthritis Maternal Grandfather   . Esophageal cancer Cousin 64  . Colon cancer Neg Hx   . Liver disease Neg Hx   . Breast cancer Neg Hx     Social History Social History  Substance Use Topics  . Smoking status: Never Smoker  . Smokeless tobacco: Never Used  . Alcohol use No     Comment: Rare    Allergies  Allergen Reactions  . Latex     Swelling and itching   . Penicillins     swelling  . Promethazine   . Shellfish Allergy     hives  . Sulfa Antibiotics     Rash and itching   . Lamictal [Lamotrigine] Rash    Blisters     Current Outpatient Prescriptions  Medication Sig Dispense Refill  . ALPRAZolam (XANAX) 0.25 MG tablet Take 0.25 mg by mouth 2 (two) times daily as needed for anxiety.     No current facility-administered medications for this visit.     Review of Systems Review of Systems  Constitutional: Negative.   Respiratory: Negative.   Cardiovascular: Negative.  Blood pressure 102/62, pulse 60, resp. rate 12, height 5\' 2"  (1.575 m), weight 132 lb (59.9 kg).  Physical Exam Physical Exam  Constitutional: She is oriented to person, place, and time. She appears well-developed and well-nourished.  HENT:  Mouth/Throat: Oropharynx is clear and moist.  Eyes: Conjunctivae are normal. No scleral icterus.  Neck: Neck supple.  Cardiovascular: Normal rate, regular rhythm and normal heart sounds.   Pulmonary/Chest: Effort normal and breath sounds normal. Right breast exhibits no inverted nipple, no mass, no nipple discharge, no skin change and no tenderness. Left breast exhibits no inverted nipple, no mass, no nipple discharge, no skin change and no tenderness.  Abdominal: Soft. Bowel sounds are normal. There is no tenderness.  Lymphadenopathy:    She has no cervical  adenopathy.    She has no axillary adenopathy.  Neurological: She is alert and oriented to person, place, and time.  Skin: Skin is dry.  Psychiatric: Her behavior is normal.    Data Reviewed Mammogram reviewed and stable.  Assessment    Stable exam. History of breast cysts     Plan  Follow up on one year with bilateral screening mammogram and office visit.   This information has been scribed by Karie Fetch RN, BSN,BC. Marland Kitchen       SANKAR,SEEPLAPUTHUR G 09/20/2015, 9:39 AM

## 2015-09-20 NOTE — Patient Instructions (Addendum)
The patient is aware to call back for any questions or concerns. Follow up on one year with bilateral screening mammogram and office visit.

## 2015-10-06 DIAGNOSIS — L820 Inflamed seborrheic keratosis: Secondary | ICD-10-CM | POA: Diagnosis not present

## 2015-10-20 ENCOUNTER — Ambulatory Visit: Payer: 59 | Admitting: Family Medicine

## 2015-11-10 DIAGNOSIS — F4001 Agoraphobia with panic disorder: Secondary | ICD-10-CM | POA: Diagnosis not present

## 2015-11-10 DIAGNOSIS — F411 Generalized anxiety disorder: Secondary | ICD-10-CM | POA: Diagnosis not present

## 2015-11-10 DIAGNOSIS — F4312 Post-traumatic stress disorder, chronic: Secondary | ICD-10-CM | POA: Diagnosis not present

## 2015-11-10 DIAGNOSIS — F330 Major depressive disorder, recurrent, mild: Secondary | ICD-10-CM | POA: Diagnosis not present

## 2015-12-05 ENCOUNTER — Encounter: Payer: 59 | Admitting: Obstetrics and Gynecology

## 2015-12-11 NOTE — Progress Notes (Signed)
GYN ANNUAL PREVENTATIVE CARE ENCOUNTER NOTE  Subjective:       Connie Francis is a 45 y.o. [phone] female here for a routine annual gynecologic exam.  Current complaints:  1.  Chronic constipation.- pelvic pain- has seen GI 2.  History of endometriosis.      Gynecologic History No LMP recorded. Patient has had a hysterectomy. Contraception: status post hysterectomy TVH Last Pap: Not needed Last mammogram: 09/2015 [facility] , followed by Dr. Jamal Collin, surgery  Obstetric History OB History  Gravida Para Term Preterm AB Living  3 2 2   1 2   SAB TAB Ectopic Multiple Live Births  1            # Outcome Date GA Lbr Len/2nd Weight Sex Delivery Anes PTL Lv  3 Term           2 Term           1 SAB             Obstetric Comments  Menstrual: 16  First pregnancy at age 44    Past Medical History:  Diagnosis Date  . Abdominal bloating   . Anxiety   . Breast cyst    left breast, follow up with Sankar  . 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 CHF (congestive heart failure) ([place]) 2006  . Chronic epigastric pain   . Constipation   . GERD (gastroesophageal reflux disease)   . Hiatal hernia 2017  . IBS (irritable bowel syndrome)   . Kidney stones   . Lump or mass in breast    R-Breast  . Migraine   . Obsessive compulsive disorder   . Thyroid disease    hyper    Past Surgical History:  Procedure Laterality Date  . BREAST CYST ASPIRATION Left    negative 2010  . CESAREAN SECTION    . COLONOSCOPY WITH PROPOFOL N/A 07/27/2014   Procedure: COLONOSCOPY WITH PROPOFOL;  Surgeon: Lucilla Lame, MD;  Location: [facility];  Service: Endoscopy;  Laterality: N/A;  . colonscopy  07/27/2015  . DILATION AND CURETTAGE OF UTERUS  123456   complicated by heart failure  . ESOPHAGOGASTRODUODENOSCOPY (EGD) WITH PROPOFOL N/A 07/27/2014   Procedure: ESOPHAGOGASTRODUODENOSCOPY (EGD) WITH PROPOFOL;  Surgeon: Lucilla Lame, MD;  Location: [facility];  Service: Endoscopy;  Laterality: N/A;   . TUBAL LIGATION    . UPPER ENDOSCOPY W/ ESOPHAGEAL MANOMETRY    . VAGINAL HYSTERECTOMY      Current Outpatient Prescriptions on File Prior to Visit  Medication Sig Dispense Refill  . ALPRAZolam (XANAX) 0.25 MG tablet Take 0.25 mg by mouth 2 (two) times daily as needed for anxiety.     No current facility-administered medications on file prior to visit.     Allergies  Allergen Reactions  . Latex     Swelling and itching   . Penicillins     swelling  . Promethazine   . Shellfish Allergy     hives  . Sulfa Antibiotics     Rash and itching   . Lamictal [Lamotrigine] Rash    Blisters     Social History   Social History  . Marital status: Married    Spouse name: N/A  . Number of children: N/A  . Years of education: N/A   Occupational History  . Not on file.   Social History Main Topics  . Smoking status: Never Smoker  . Smokeless tobacco: Never Used  . Alcohol use No  Comment: Rare  . Drug use: No  . Sexual activity: No   Other Topics Concern  . Not on file   Social History Narrative  . No narrative on file    Family History  Problem Relation Age of Onset  . Cancer Maternal Aunt     parotid Ca  . Hearing loss Maternal Grandmother   . Alcohol abuse Maternal Grandmother   . Stroke Paternal Aunt   . Cancer Paternal Uncle     lung, nasopharnygeal  . Arthritis Maternal Grandfather   . Esophageal cancer Cousin 62  . Colon cancer Neg Hx   . Liver disease Neg Hx   . Breast cancer Neg Hx     The following portions of the patient's history were reviewed and updated as appropriate: allergies, current medications, past family history, past medical history, past social history, past surgical history and problem list.  Review of Systems ROS   Objective:  Physical Exam  Constitutional: She is oriented to person, place, and time. She appears well-developed and well-nourished.  HENT:  Head: Normocephalic and atraumatic.  Neck: Normal range of motion.  Neck supple. No thyromegaly present.  Cardiovascular: Normal rate, regular rhythm and normal heart sounds.   Pulmonary/Chest: Effort normal and breath sounds normal.  Abdominal: Soft. She exhibits no distension. There is no tenderness.  Genitourinary:  Genitourinary Comments: PELVIC: External genitalia-normal BUS-normal. Vagina-normal estrogen effect; good vault support Rectal-normal sphincter tone; no rectal masses Cervix-surgically absent Uterus-surgically absent Adnexa-nonpalpable; nontender  Musculoskeletal: Normal range of motion.  Lymphadenopathy:    She has no cervical adenopathy.  Neurological: She is alert and oriented to person, place, and time.  Skin: Skin is warm and dry. No rash noted.  Psychiatric: She has a normal mood and affect. Her behavior is normal.   BP 130/80   Pulse 69   Ht 5\' 2"  (1.575 m)   Wt 134 lb 11.2 oz (61.1 kg)   BMI 24.64 kg/m   Assessment:   Annual gynecologic examination 45 y.o. Contraception: status post hysterectomy Normal BMI Patient Active Problem List   Diagnosis Date Noted  . Reactive hypoglycemia 12/03/2014  . IBS (irritable bowel syndrome) 07/17/2014  . S/P total hysterectomy 07/15/2014  . Cyst (solitary) of breast 07/12/2013  . Edema 01/18/2013  . Elevated blood pressure 01/18/2013  . Psychogenic dyspepsia 11/27/2012  . Visit for preventive health examination 06/18/2012  . Acquired cyst of kidney 02/18/2012  . H/O urinary stone 02/18/2012  . Depression with anxiety 01/07/2012  . Adaptive colitis 12/06/2011  . Obsessive compulsive disorder   . Postpartum cardiomyopathy 09/13/2010     Plan:  Pap: Not needed Mammogram: negative (followed by general surgery) Labs: followed by Dr. Derrel Nip  Stool Guaiac Testing:not indicated Routine preventative health maintenance measures emphasized: Diet/Weight control, Tobacco Cessation and Alcohol/Drug use Encourage increased water intake, Colace, high-fiber diet, Metamucil for chronic  constipation/IBS Return to Clinic - [place], [place]

## 2015-12-13 ENCOUNTER — Ambulatory Visit (INDEPENDENT_AMBULATORY_CARE_PROVIDER_SITE_OTHER): Payer: 59 | Admitting: Obstetrics and Gynecology

## 2015-12-13 ENCOUNTER — Encounter: Payer: Self-pay | Admitting: Obstetrics and Gynecology

## 2015-12-13 VITALS — BP 130/80 | HR 69 | Ht 62.0 in | Wt 134.7 lb

## 2015-12-13 DIAGNOSIS — N809 Endometriosis, unspecified: Secondary | ICD-10-CM

## 2015-12-13 DIAGNOSIS — Z90710 Acquired absence of both cervix and uterus: Secondary | ICD-10-CM

## 2015-12-13 DIAGNOSIS — K5909 Other constipation: Secondary | ICD-10-CM

## 2015-12-13 DIAGNOSIS — Z01419 Encounter for gynecological examination (general) (routine) without abnormal findings: Secondary | ICD-10-CM

## 2015-12-13 MED ORDER — NORETHINDRONE 0.35 MG PO TABS: 1.0000 | ORAL_TABLET | Freq: Every day | ORAL | 1 refills | Status: DC

## 2015-12-13 NOTE — Patient Instructions (Signed)
1. Start Micronor 1 tablet a day for endometriosis management 2. Return in 3 months for follow-up  Health Maintenance, Female Introduction Adopting a healthy lifestyle and getting preventive care can go a long way to promote health and wellness. Talk with your health care provider about what schedule of regular examinations is right for you. This is a good chance for you to check in with your provider about disease prevention and staying healthy. In between checkups, there are plenty of things you can do on your own. Experts have done a lot of research about which lifestyle changes and preventive measures are most likely to keep you healthy. Ask your health care provider for more information. Weight and diet Eat a healthy diet  Be sure to include plenty of vegetables, fruits, low-fat dairy products, and lean protein.  Do not eat a lot of foods high in solid fats, added sugars, or salt.  Get regular exercise. This is one of the most important things you can do for your health.  Most adults should exercise for at least 150 minutes each week. The exercise should increase your heart rate and make you sweat (moderate-intensity exercise).  Most adults should also do strengthening exercises at least twice a week. This is in addition to the moderate-intensity exercise. Maintain a healthy weight  Body mass index (BMI) is a measurement that can be used to identify possible weight problems. It estimates body fat based on height and weight. Your health care provider can help determine your BMI and help you achieve or maintain a healthy weight.  For females 62 years of age and older:  A BMI below 18.5 is considered underweight.  A BMI of 18.5 to 24.9 is normal.  A BMI of 25 to 29.9 is considered overweight.  A BMI of 30 and above is considered obese. Watch levels of cholesterol and blood lipids  You should start having your blood tested for lipids and cholesterol at 43 years of age, then have  this test every 5 years.  You may need to have your cholesterol levels checked more often if:  Your lipid or cholesterol levels are high.  You are older than 43 years of age.  You are at high risk for heart disease. Cancer screening Lung Cancer  Lung cancer screening is recommended for adults 79-23 years old who are at high risk for lung cancer because of a history of smoking.  A yearly low-dose CT scan of the lungs is recommended for people who:  Currently smoke.  Have quit within the past 15 years.  Have at least a 30-pack-year history of smoking. A pack year is smoking an average of one pack of cigarettes a day for 1 year.  Yearly screening should continue until it has been 15 years since you quit.  Yearly screening should stop if you develop a health problem that would prevent you from having lung cancer treatment. Breast Cancer  Practice breast self-awareness. This means understanding how your breasts normally appear and feel.  It also means doing regular breast self-exams. Let your health care provider know about any changes, no matter how small.  If you are in your 20s or 30s, you should have a clinical breast exam (CBE) by a health care provider every 1-3 years as part of a regular health exam.  If you are 28 or older, have a CBE every year. Also consider having a breast X-ray (mammogram) every year.  If you have a family history of breast cancer, talk  to your health care provider about genetic screening.  If you are at high risk for breast cancer, talk to your health care provider about having an MRI and a mammogram every year.  Breast cancer gene (BRCA) assessment is recommended for women who have family members with BRCA-related cancers. BRCA-related cancers include:  Breast.  Ovarian.  Tubal.  Peritoneal cancers.  Results of the assessment will determine the need for genetic counseling and BRCA1 and BRCA2 testing. Cervical Cancer  Your health care  provider may recommend that you be screened regularly for cancer of the pelvic organs (ovaries, uterus, and vagina). This screening involves a pelvic examination, including checking for microscopic changes to the surface of your cervix (Pap test). You may be encouraged to have this screening done every 3 years, beginning at age 20.  For women ages 23-65, health care providers may recommend pelvic exams and Pap testing every 3 years, or they may recommend the Pap and pelvic exam, combined with testing for human papilloma virus (HPV), every 5 years. Some types of HPV increase your risk of cervical cancer. Testing for HPV may also be done on women of any age with unclear Pap test results.  Other health care providers may not recommend any screening for nonpregnant women who are considered low risk for pelvic cancer and who do not have symptoms. Ask your health care provider if a screening pelvic exam is right for you.  If you have had past treatment for cervical cancer or a condition that could lead to cancer, you need Pap tests and screening for cancer for at least 20 years after your treatment. If Pap tests have been discontinued, your risk factors (such as having a new sexual partner) need to be reassessed to determine if screening should resume. Some women have medical problems that increase the chance of getting cervical cancer. In these cases, your health care provider may recommend more frequent screening and Pap tests. Colorectal Cancer  This type of cancer can be detected and often prevented.  Routine colorectal cancer screening usually begins at 43 years of age and continues through 43 years of age.  Your health care provider may recommend screening at an earlier age if you have risk factors for colon cancer.  Your health care provider may also recommend using home test kits to check for hidden blood in the stool.  A small camera at the end of a tube can be used to examine your colon directly  (sigmoidoscopy or colonoscopy). This is done to check for the earliest forms of colorectal cancer.  Routine screening usually begins at age 64.  Direct examination of the colon should be repeated every 5-10 years through 43 years of age. However, you may need to be screened more often if early forms of precancerous polyps or small growths are found. Skin Cancer  Check your skin from head to toe regularly.  Tell your health care provider about any new moles or changes in moles, especially if there is a change in a mole's shape or color.  Also tell your health care provider if you have a mole that is larger than the size of a pencil eraser.  Always use sunscreen. Apply sunscreen liberally and repeatedly throughout the day.  Protect yourself by wearing long sleeves, pants, a wide-brimmed hat, and sunglasses whenever you are outside. Heart disease, diabetes, and high blood pressure  High blood pressure causes heart disease and increases the risk of stroke. High blood pressure is more likely to develop  in:  People who have blood pressure in the high end of the normal range (130-139/85-89 mm Hg).  People who are overweight or obese.  People who are African American.  If you are 8-61 years of age, have your blood pressure checked every 3-5 years. If you are 24 years of age or older, have your blood pressure checked every year. You should have your blood pressure measured twice-once when you are at a hospital or clinic, and once when you are not at a hospital or clinic. Record the average of the two measurements. To check your blood pressure when you are not at a hospital or clinic, you can use:  An automated blood pressure machine at a pharmacy.  A home blood pressure monitor.  If you are between 13 years and 17 years old, ask your health care provider if you should take aspirin to prevent strokes.  Have regular diabetes screenings. This involves taking a blood sample to check your  fasting blood sugar level.  If you are at a normal weight and have a low risk for diabetes, have this test once every three years after 43 years of age.  If you are overweight and have a high risk for diabetes, consider being tested at a younger age or more often. Preventing infection Hepatitis B  If you have a higher risk for hepatitis B, you should be screened for this virus. You are considered at high risk for hepatitis B if:  You were born in a country where hepatitis B is common. Ask your health care provider which countries are considered high risk.  Your parents were born in a high-risk country, and you have not been immunized against hepatitis B (hepatitis B vaccine).  You have HIV or AIDS.  You use needles to inject street drugs.  You live with someone who has hepatitis B.  You have had sex with someone who has hepatitis B.  You get hemodialysis treatment.  You take certain medicines for conditions, including cancer, organ transplantation, and autoimmune conditions. Hepatitis C  Blood testing is recommended for:  Everyone born from [birth year] through 1963.  Anyone with known risk factors for hepatitis C. Sexually transmitted infections (STIs)  You should be screened for sexually transmitted infections (STIs) including gonorrhea and chlamydia if:  You are sexually active and are younger than 43 years of age.  You are older than 43 years of age and your health care provider tells you that you are at risk for this type of infection.  Your sexual activity has changed since you were last screened and you are at an increased risk for chlamydia or gonorrhea. Ask your health care provider if you are at risk.  If you do not have HIV, but are at risk, it may be recommended that you take a prescription medicine daily to prevent HIV infection. This is called pre-exposure prophylaxis (PrEP). You are considered at risk if:  You are sexually active and do not regularly use condoms or  know the HIV status of your partner(s).  You take drugs by injection.  You are sexually active with a partner who has HIV. Talk with your health care provider about whether you are at high risk of being infected with HIV. If you choose to begin PrEP, you should first be tested for HIV. You should then be tested every 3 months for as long as you are taking PrEP. Pregnancy  If you are premenopausal and you may become pregnant, ask your health care  provider about preconception counseling.  If you may become pregnant, take 400 to 800 micrograms (mcg) of folic acid every day.  If you want to prevent pregnancy, talk to your health care provider about birth control (contraception). Osteoporosis and menopause  Osteoporosis is a disease in which the bones lose minerals and strength with aging. This can result in serious bone fractures. Your risk for osteoporosis can be identified using a bone density scan.  If you are 44 years of age or older, or if you are at risk for osteoporosis and fractures, ask your health care provider if you should be screened.  Ask your health care provider whether you should take a calcium or vitamin D supplement to lower your risk for osteoporosis.  Menopause may have certain physical symptoms and risks.  Hormone replacement therapy may reduce some of these symptoms and risks. Talk to your health care provider about whether hormone replacement therapy is right for you. Follow these instructions at home:  Schedule regular health, dental, and eye exams.  Stay current with your immunizations.  Do not use any tobacco products including cigarettes, chewing tobacco, or electronic cigarettes.  If you are pregnant, do not drink alcohol.  If you are breastfeeding, limit how much and how often you drink alcohol.  Limit alcohol intake to no more than 1 drink per day for nonpregnant women. One drink equals 12 ounces of beer, 5 ounces of wine, or 1 ounces of hard  liquor.  Do not use street drugs.  Do not share needles.  Ask your health care provider for help if you need support or information about quitting drugs.  Tell your health care provider if you often feel depressed.  Tell your health care provider if you have ever been abused or do not feel safe at home. This information is not intended to replace advice given to you by your health care provider. Make sure you discuss any questions you have with your health care provider. Document Released: 07/16/2010 Document Revised: 06/08/2015 Document Reviewed: 10/04/2014  2017 [employer]

## 2016-01-03 ENCOUNTER — Ambulatory Visit (INDEPENDENT_AMBULATORY_CARE_PROVIDER_SITE_OTHER): Payer: 59 | Admitting: Internal Medicine

## 2016-01-03 ENCOUNTER — Encounter: Payer: Self-pay | Admitting: Internal Medicine

## 2016-01-03 DIAGNOSIS — R519 Headache, unspecified: Secondary | ICD-10-CM

## 2016-01-03 DIAGNOSIS — F422 Mixed obsessional thoughts and acts: Secondary | ICD-10-CM | POA: Diagnosis not present

## 2016-01-03 DIAGNOSIS — F418 Other specified anxiety disorders: Secondary | ICD-10-CM

## 2016-01-03 DIAGNOSIS — H538 Other visual disturbances: Secondary | ICD-10-CM

## 2016-01-03 DIAGNOSIS — R51 Headache: Secondary | ICD-10-CM | POA: Diagnosis not present

## 2016-01-03 MED ORDER — METOPROLOL SUCCINATE ER 25 MG PO TB24: 25.0000 mg | ORAL_TABLET | Freq: Every day | ORAL | 3 refills | Status: DC

## 2016-01-03 NOTE — Progress Notes (Signed)
Subjective:  Patient ID: Connie Francis, female    DOB: 03/17/1970  Age: 45 y.o. MRN: [id number]  CC: Diagnoses of Depression with anxiety, Mixed obsessional thoughts and acts, Recurrent headache, and Blurred vision, bilateral were pertinent to this visit.  HPI Connie Francis presents for evaluation of elevated blood pressure, frequent  headaches and blurred vision.  Patient reports increased fatigue due to work schedule .  She is averaging 10 hours of overtime per week, most of which is spend looking at a computer screen.  She notes blurred vision by the end of the day, when she tries to help her 15 yr old son with his homework.  She has been getting less sleep due to holiday visitations from relatives and has not been  going to bed before midnight. Averaging 6 hours of sleep per night.   No trouble falling alseep , headaches have been more frequent and are typical migraines.  She has been measuring her blood pressure at [facility] and her readings are never over [id number] systolic    Outpatient Medications Prior to Visit  Medication Sig Dispense Refill  . ALPRAZolam (XANAX) 0.25 MG tablet Take 0.25 mg by mouth 2 (two) times daily as needed for anxiety.    . norethindrone (ORTHO MICRONOR) 0.35 MG tablet Take 1 tablet (0.35 mg total) by mouth daily. 3 Package 1   No facility-administered medications prior to visit.     Review of Systems;  Patient denies headache, fevers, malaise, unintentional weight loss, skin rash, eye pain, sinus congestion and sinus pain, sore throat, dysphagia,  hemoptysis , cough, dyspnea, wheezing, chest pain, palpitations, orthopnea, edema, abdominal pain, nausea, melena, diarrhea, constipation, flank pain, dysuria, hematuria, urinary  Frequency, nocturia, numbness, tingling, seizures,  Focal weakness, Loss of consciousness,  Tremor, insomnia, depression, anxiety, and suicidal ideation.      Objective:  BP 140/82   Pulse 78   Temp 98.2 F (36.8 C) (Oral)   Resp 16   Ht 5'  2" (1.575 m)   Wt 136 lb 4 oz (61.8 kg)   SpO2 97%   BMI 24.92 kg/m   BP Readings from Last 3 Encounters:  01/03/16 140/82  12/13/15 130/80  09/20/15 102/62    Wt Readings from Last 3 Encounters:  01/03/16 136 lb 4 oz (61.8 kg)  12/13/15 134 lb 11.2 oz (61.1 kg)  09/20/15 132 lb (59.9 kg)    General appearance: alert, cooperative and appears stated age Ears: normal TM's and external ear canals both ears Throat: lips, mucosa, and tongue normal; teeth and gums normal Neck: no adenopathy, no carotid bruit, supple, symmetrical, trachea midline and thyroid not enlarged, symmetric, no tenderness/mass/nodules Back: symmetric, no curvature. ROM normal. No CVA tenderness. Lungs: clear to auscultation bilaterally Heart: regular rate and rhythm, S1, S2 normal, no murmur, click, rub or gallop Abdomen: soft, non-tender; bowel sounds normal; no masses,  no organomegaly Pulses: 2+ and symmetric Skin: Skin color, texture, turgor normal. No rashes or lesions Lymph nodes: Cervical, supraclavicular, and axillary nodes normal.  Lab Results  Component Value Date   HGBA1C 4.8 07/20/2015   HGBA1C 5.3 12/05/2014    Lab Results  Component Value Date   CREATININE 0.66 07/20/2015   CREATININE 0.6 12/05/2014   CREATININE 0.63 06/24/2013    Lab Results  Component Value Date   WBC 4.7 07/20/2015   HGB 12.7 07/20/2015   HCT 38.0 07/20/2015   PLT 282.0 07/20/2015   GLUCOSE 90 07/20/2015   CHOL 188 07/20/2015  TRIG 53.0 07/20/2015   HDL 67.50 07/20/2015   LDLCALC 110 (H) 07/20/2015   ALT 11 07/20/2015   AST 15 07/20/2015   NA 138 07/20/2015   K 4.0 07/20/2015   CL 104 07/20/2015   CREATININE 0.66 07/20/2015   BUN 12 07/20/2015   CO2 28 07/20/2015   TSH 0.68 07/20/2015   HGBA1C 4.8 07/20/2015    Mm Screening Breast Tomo Bilateral  Result Date: 09/19/2015 CLINICAL DATA:  Screening. EXAM: 2D DIGITAL SCREENING BILATERAL MAMMOGRAM WITH CAD AND ADJUNCT TOMO COMPARISON:  Previous exam(s).  ACR Breast Density Category c: The breast tissue is heterogeneously dense, which may obscure small masses. FINDINGS: There are no findings suspicious for malignancy. Images were processed with CAD. IMPRESSION: No mammographic evidence of malignancy. A result letter of this screening mammogram will be mailed directly to the patient. RECOMMENDATION: Screening mammogram in one year. (Code:SM-B-01Y) BI-RADS CATEGORY  1: Negative. Electronically Signed   By: Lillia Mountain M.D.   On: 09/19/2015 08:51    Assessment & Plan:   Problem List Items Addressed This Visit    Blurred vision, bilateral     Recurrent daily . By end of day suggestive of dry eye from too much computer screen work without resting eyes.  recommended use of OTC lubricants every 2 hours       Depression with anxiety    Controlled with citalopram daily and rare use of alprazolam managed by  Dr. Nicolasa Ducking.         Obsessive compulsive disorder    Managed with zoloft 50 mg daily by dr. Nicolasa Ducking, no changes today       Recurrent headache    Secondary to fatigue and eye strain vs migraine.  Occurring > 1/week,  Adding toprol       Relevant Medications   sertraline (ZOLOFT) 50 MG tablet   citalopram (CELEXA) 10 MG tablet   metoprolol succinate (TOPROL-XL) 25 MG 24 hr tablet      I have discontinued Ms. Chasse's norethindrone. I am also having her start on metoprolol succinate. Additionally, I am having her maintain her ALPRAZolam, sertraline, and citalopram.  Meds ordered this encounter  Medications  . sertraline (ZOLOFT) 50 MG tablet    Sig: Take 50 mg by mouth daily.  . citalopram (CELEXA) 10 MG tablet    Sig: Take 10 mg by mouth daily.  . metoprolol succinate (TOPROL-XL) 25 MG 24 hr tablet    Sig: Take 1 tablet (25 mg total) by mouth daily.    Dispense:  90 tablet    Refill:  3  A total of 25 minutes of face to face time was spent with patient more than half of which was spent in counselling about the above mentioned conditions   and coordination of care   Medications Discontinued During This Encounter  Medication Reason  . norethindrone (ORTHO MICRONOR) 0.35 MG tablet Patient Discharge    Follow-up: No Follow-up on file.   Crecencio Mc, MD

## 2016-01-03 NOTE — Patient Instructions (Addendum)
Your headaches are not from your elevated blood pressure (it takes a systolic of [id number] to cause blurred vision and headaches)  I recommend getting eye lubricating drops (Systane ,  Refresh) to use every 2-3 hours because staring at the screen dries out your eyes.   Trial of toprol xl 25 mg at bedtime for headache prevention and  for  lowering of of blood pressure

## 2016-01-03 NOTE — Progress Notes (Signed)
Pre-visit discussion using our clinic review tool. No additional management support is needed unless otherwise documented below in the visit note.  

## 2016-01-06 ENCOUNTER — Encounter: Payer: Self-pay | Admitting: Internal Medicine

## 2016-01-06 DIAGNOSIS — R51 Headache: Secondary | ICD-10-CM

## 2016-01-06 DIAGNOSIS — R519 Headache, unspecified: Secondary | ICD-10-CM | POA: Insufficient documentation

## 2016-01-06 DIAGNOSIS — H538 Other visual disturbances: Secondary | ICD-10-CM | POA: Insufficient documentation

## 2016-01-06 NOTE — Assessment & Plan Note (Signed)
Adding lose dose toprol xl at bedtime given concurrent headache disorder

## 2016-01-06 NOTE — Assessment & Plan Note (Signed)
Recurrent daily . By end of day suggestive of dry eye from too much computer screen work without resting eyes.  recommended use of OTC lubricants every 2 hours

## 2016-01-06 NOTE — Assessment & Plan Note (Signed)
Secondary to fatigue and eye strain vs migraine.  Occurring > 1/week,  Adding toprol

## 2016-01-06 NOTE — Assessment & Plan Note (Addendum)
Controlled with citalopram daily and rare use of alprazolam managed by  Dr. Nicolasa Ducking.

## 2016-01-06 NOTE — Assessment & Plan Note (Signed)
Managed with zoloft 50 mg daily by dr. Nicolasa Ducking, no changes today

## 2016-01-14 ENCOUNTER — Telehealth: Payer: 59 | Admitting: Nurse Practitioner

## 2016-01-14 DIAGNOSIS — J0100 Acute maxillary sinusitis, unspecified: Secondary | ICD-10-CM

## 2016-01-14 MED ORDER — AZITHROMYCIN 250 MG PO TABS: ORAL_TABLET | ORAL | 0 refills | Status: DC

## 2016-01-14 NOTE — Progress Notes (Signed)

## 2016-01-26 DIAGNOSIS — H5203 Hypermetropia, bilateral: Secondary | ICD-10-CM | POA: Diagnosis not present

## 2016-02-23 DIAGNOSIS — F411 Generalized anxiety disorder: Secondary | ICD-10-CM | POA: Diagnosis not present

## 2016-02-23 DIAGNOSIS — F4001 Agoraphobia with panic disorder: Secondary | ICD-10-CM | POA: Diagnosis not present

## 2016-02-23 DIAGNOSIS — F4312 Post-traumatic stress disorder, chronic: Secondary | ICD-10-CM | POA: Diagnosis not present

## 2016-02-23 DIAGNOSIS — F330 Major depressive disorder, recurrent, mild: Secondary | ICD-10-CM | POA: Diagnosis not present

## 2016-02-24 ENCOUNTER — Ambulatory Visit (INDEPENDENT_AMBULATORY_CARE_PROVIDER_SITE_OTHER): Payer: 59

## 2016-02-24 ENCOUNTER — Ambulatory Visit
Admission: EM | Admit: 2016-02-24 | Discharge: 2016-02-24 | Disposition: A | Discharge status: Home / Self Care | Payer: 59 | Attending: Family Medicine | Admitting: Family Medicine

## 2016-02-24 ENCOUNTER — Encounter: Payer: Self-pay | Admitting: Gynecology

## 2016-02-24 DIAGNOSIS — J069 Acute upper respiratory infection, unspecified: Secondary | ICD-10-CM | POA: Diagnosis not present

## 2016-02-24 DIAGNOSIS — R059 Cough, unspecified: Secondary | ICD-10-CM

## 2016-02-24 DIAGNOSIS — J209 Acute bronchitis, unspecified: Secondary | ICD-10-CM | POA: Diagnosis not present

## 2016-02-24 DIAGNOSIS — R05 Cough: Secondary | ICD-10-CM

## 2016-02-24 MED ORDER — HYDROCOD POLST-CPM POLST ER 10-8 MG/5ML PO SUER: 5.0000 mL | Freq: Two times a day (BID) | ORAL | 0 refills | Status: DC | PRN

## 2016-02-24 MED ORDER — AZITHROMYCIN 500 MG PO TABS: ORAL_TABLET | ORAL | 0 refills | Status: DC

## 2016-02-24 NOTE — ED Triage Notes (Signed)
Per patient cough x 1 week. Patient stated constant cough causing mid back pain.

## 2016-02-24 NOTE — ED Provider Notes (Signed)
[facility]    CSN: [id number] Arrival date & time: 02/24/16  [id number]     History   Chief Complaint Chief Complaint  Patient presents with  . Cough    HPI Connie Francis is a 46 y.o. female.   Patient is a 46 year old white female with persistent cough and chest congestion that started last weekend. The cough has persisted but around Wednesday to cough and chest congestion has gotten worse. The cough and chest congestion was mostly just in the morning when she woke up but after Wednesday she started having now sharp pain in her right lower back right foot. She is is noticed increased coughing while she's has some running nose nasal congestion that is clear. She is unable to bring up anything from her lungs at all. She is not noticing wheezing and bronchospasms. She had a left-sided pneumonia a few years ago and she's had bronchitis before and required prednisone one time with bronchitis and not respond after a round of antibiotics. She is allergic to latex, penicillin, sulfur, Lamictal, Phenergan and shellfish. Family medical history positive for esophageal cancer in a cousin and hypertension in the mother. She does not smoke. And she has a history of hypertension, and irritable bowel.   The history is provided by the patient.  Cough  Cough characteristics:  Non-productive and productive Sputum characteristics:  Nondescript Severity:  Moderate Timing:  Constant Progression:  Worsening Chronicity:  New Smoker: no   Context: upper respiratory infection   Context: not smoke exposure   Relieved by:  Nothing Worsened by:  Nothing Ineffective treatments:  None tried Associated symptoms: chest pain, rhinorrhea and shortness of breath   Associated symptoms: no sore throat and no wheezing     Past Medical History:  Diagnosis Date  . Abdominal bloating   . Anxiety   . Breast cyst    left breast, follow up with [facility]  . 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  CHF (congestive heart failure) ([place]) 2006  . Chronic epigastric pain   . Constipation   . GERD (gastroesophageal reflux disease)   . Hiatal hernia 2017  . IBS (irritable bowel syndrome)   . Kidney stones   . Lump or mass in breast    R-Breast  . Migraine   . Obsessive compulsive disorder   . Thyroid disease    hyper    Patient Active Problem List   Diagnosis Date Noted  . Recurrent headache 01/06/2016  . Blurred vision, bilateral 01/06/2016  . Reactive hypoglycemia 12/03/2014  . IBS (irritable bowel syndrome) 07/17/2014  . S/P total hysterectomy 07/15/2014  . Cyst (solitary) of breast 07/12/2013  . Edema 01/18/2013  . Elevated blood pressure 01/18/2013  . Psychogenic dyspepsia 11/27/2012  . Visit for preventive health examination 06/18/2012  . Acquired cyst of kidney 02/18/2012  . H/O urinary stone 02/18/2012  . Depression with anxiety 01/07/2012  . Adaptive colitis 12/06/2011  . Obsessive compulsive disorder   . 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 BREAST CYST ASPIRATION Left    negative 2010  . CESAREAN SECTION    . COLONOSCOPY WITH PROPOFOL N/A 07/27/2014   Procedure: COLONOSCOPY WITH PROPOFOL;  Surgeon: Lucilla Lame, MD;  Location: [place];  Service: Endoscopy;  Laterality: N/A;  . colonscopy  07/27/2015  . DILATION AND CURETTAGE OF UTERUS  [id number]   complicated by heart failure  . ESOPHAGOGASTRODUODENOSCOPY (EGD) WITH PROPOFOL N/A 07/27/2014   Procedure:  ESOPHAGOGASTRODUODENOSCOPY (EGD) WITH PROPOFOL;  Surgeon: Lucilla Lame, MD;  Location: [place];  Service: Endoscopy;  Laterality: N/A;  . TUBAL LIGATION    . UPPER ENDOSCOPY W/ ESOPHAGEAL MANOMETRY    . VAGINAL HYSTERECTOMY      OB History    Gravida Para Term Preterm AB Living   3 2 2   1 2    SAB TAB Ectopic Multiple Live Births   1       2      Obstetric Comments   Menstrual: 89 First pregnancy at age 47       Home  Medications    Prior to Admission medications   Medication Sig Start Date End Date Taking? Authorizing Provider  ALPRAZolam (XANAX) 0.25 MG tablet Take 0.25 mg by mouth 2 (two) times daily as needed for anxiety.   Yes Historical Provider, MD  citalopram (CELEXA) 10 MG tablet Take 10 mg by mouth daily.   Yes Historical Provider, MD  metoprolol succinate (TOPROL-XL) 25 MG 24 hr tablet Take 1 tablet (25 mg total) by mouth daily. 01/03/16  Yes Crecencio Mc, MD  sertraline (ZOLOFT) 50 MG tablet Take 50 mg by mouth daily.   Yes Historical Provider, MD  azithromycin (ZITHROMAX Z-PAK) 250 MG tablet As directed 01/14/16   Mary-Margaret Hassell Done, FNP  azithromycin (ZITHROMAX) 500 MG tablet On tablet daily for 5 days 02/24/16   Frederich Cha, MD  chlorpheniramine-HYDROcodone [facility]) 10-8 MG/5ML SUER Take 5 mLs by mouth every 12 (twelve) hours as needed. 02/24/16   Frederich Cha, MD    Family History Family History  Problem Relation Age of Onset  . Cancer Maternal Aunt     parotid Ca  . Hearing loss Maternal Grandmother   . Alcohol abuse Maternal Grandmother   . Hypertension Mother   . Stroke Paternal Aunt   . Cancer Paternal Uncle     lung, nasopharnygeal  . Arthritis Maternal Grandfather   . Esophageal cancer Cousin 70  . Colon cancer Neg Hx   . Liver disease Neg Hx   . Breast cancer Neg Hx   . Ovarian cancer Neg Hx   . Diabetes Neg Hx     Social History Social History  Substance Use Topics  . Smoking status: Never Smoker  . Smokeless tobacco: Never Used  . Alcohol use 0.6 oz/week    1 Glasses of wine per week     Comment: Rare     Allergies   Latex; Penicillins; Promethazine; Shellfish allergy; Sulfa antibiotics; and Lamictal [lamotrigine]   Review of Systems Review of Systems  HENT: Positive for rhinorrhea. Negative for sore throat.   Respiratory: Positive for cough and shortness of breath. Negative for wheezing.   Cardiovascular: Positive for chest pain.    All other systems reviewed and are negative.    Physical Exam Triage Vital Signs ED Triage Vitals  Enc Vitals Group     BP 02/24/16 1009 140/81     Pulse Rate 02/24/16 1009 67     Resp --      Temp 02/24/16 1009 98.2 F (36.8 C)     Temp Source 02/24/16 1009 Oral     SpO2 02/24/16 1009 98 %     Weight 02/24/16 1011 135 lb (61.2 kg)     Height 02/24/16 1011 5\' 2"  (1.575 m)     Head Circumference --      Peak Flow --      Pain Score 02/24/16 1012 4  Pain Loc --      Pain Edu? --      Excl. in [place]? --    No data found.   Updated Vital Signs BP 140/81 (BP Location: Left Arm)   Pulse 67   Temp 98.2 F (36.8 C) (Oral)   Ht 5\' 2"  (1.575 m)   Wt 135 lb (61.2 kg)   SpO2 98%   BMI 24.69 kg/m   Visual Acuity Right Eye Distance:   Left Eye Distance:   Bilateral Distance:    Right Eye Near:   Left Eye Near:    Bilateral Near:     Physical Exam  Constitutional: She is oriented to person, place, and time. She appears well-developed and well-nourished.  HENT:  Head: Normocephalic and atraumatic.  Right Ear: Hearing, tympanic membrane, external ear and ear canal normal.  Left Ear: Hearing, tympanic membrane, external ear and ear canal normal.  Nose: Mucosal edema present.  Mouth/Throat: Uvula is midline and oropharynx is clear and moist. No uvula swelling. No posterior oropharyngeal erythema.  Eyes: Pupils are equal, round, and reactive to light.  Neck: Normal range of motion. Neck supple.  Cardiovascular: Normal rate, regular rhythm, S1 normal, S2 normal and normal heart sounds.  Exam reveals no distant heart sounds.   Pulmonary/Chest: Effort normal and breath sounds normal.  Patient has some crackling noise in the right lower base  Musculoskeletal: Normal range of motion.  Lymphadenopathy:    She has no cervical adenopathy.  Neurological: She is alert and oriented to person, place, and time.  Skin: Skin is warm.  Psychiatric: She has a normal mood and affect.   Vitals reviewed.    UC Treatments / Results  Labs (all labs ordered are listed, but only abnormal results are displayed) Labs Reviewed - No data to display  EKG  EKG Interpretation None       Radiology Dg Chest 2 View  Result Date: 02/24/2016 CLINICAL DATA:  Cough for 10 days EXAM: CHEST  2 VIEW COMPARISON:  10/10/2014 chest radiograph. FINDINGS: Stable cardiomediastinal silhouette with normal heart size. No pneumothorax. No pleural effusion. Lungs appear clear, with no acute consolidative airspace disease and no pulmonary edema. IMPRESSION: No active cardiopulmonary disease. Electronically Signed   By: Ilona Sorrel M.D.   On: 02/24/2016 11:48    Procedures Procedures (including critical care time)  Medications Ordered in UC Medications - No data to display   Initial Impression / Assessment and Plan / UC Course  I have reviewed the triage vital signs and the nursing notes.  Pertinent labs & imaging results that were available during my care of the patient were reviewed by me and considered in my medical decision making (see chart for details).   patient's chest x-ray was negative will treat for bronchitis with Zithromax and Tussionex   Final Clinical Impressions(s) / UC Diagnoses   Final diagnoses:  Acute bronchitis, unspecified organism  Cough  Upper respiratory tract infection, unspecified type    New Prescriptions New Prescriptions   AZITHROMYCIN (ZITHROMAX) 500 MG TABLET    On tablet daily for 5 days   CHLORPHENIRAMINE-HYDROCODONE (TUSSIONEX PENNKINETIC ER) 10-8 MG/5ML SUER    Take 5 mLs by mouth every 12 (twelve) hours as needed.     Note: This dictation was prepared with Dragon dictation along with smaller phrase technology. Any transcriptional errors that result from this process are unintentional.   Frederich Cha, MD 02/24/16 1209

## 2016-03-08 ENCOUNTER — Telehealth: Payer: Self-pay | Admitting: Internal Medicine

## 2016-03-08 NOTE — Telephone Encounter (Signed)
Pt called c/o abdominal pain and diarrhea times one week. No available appts today. Please advise, thank you!  Call pt @ [phone]

## 2016-03-08 NOTE — Telephone Encounter (Signed)
Reason for call: abdominal pain ,  Symptoms: right middle side navel, low grade fever, low back pain ,diarrhea Duration 1 week  Medications previously on azithromycin for bronchitis Advised patient to go to urgent care for further work up due to duration of symptoms .   Patient agreed to.

## 2016-03-21 ENCOUNTER — Encounter: Payer: 59 | Admitting: Obstetrics and Gynecology

## 2016-04-08 ENCOUNTER — Other Ambulatory Visit (INDEPENDENT_AMBULATORY_CARE_PROVIDER_SITE_OTHER): Payer: 59 | Admitting: *Deleted

## 2016-04-08 DIAGNOSIS — R319 Hematuria, unspecified: Secondary | ICD-10-CM | POA: Diagnosis not present

## 2016-04-08 LAB — POCT URINALYSIS DIPSTICK
Bilirubin, UA: NEGATIVE
GLUCOSE UA: NEGATIVE
KETONES UA: NEGATIVE
NITRITE UA: NEGATIVE
PH UA: 6 (ref 5.0–8.0)
Protein, UA: NEGATIVE
Spec Grav, UA: 1.01 (ref 1.030–1.035)
Urobilinogen, UA: 0.2 (ref ?–2.0)

## 2016-04-08 MED ORDER — CIPROFLOXACIN HCL 500 MG PO TABS: 500.0000 mg | ORAL_TABLET | Freq: Two times a day (BID) | ORAL | 0 refills | Status: DC

## 2016-04-08 NOTE — Progress Notes (Signed)
Pt here today c/o low back pain with low grade fever and malaise.  UA + leukocytes and moderate blood, will send urine cx to lab for confirmation.  Cipro sent to pharmacy.

## 2016-04-10 LAB — URINE CULTURE: Organism ID, Bacteria: NO GROWTH

## 2016-04-17 ENCOUNTER — Telehealth: Payer: Self-pay

## 2016-04-17 NOTE — Telephone Encounter (Addendum)
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

## 2016-04-23 ENCOUNTER — Ambulatory Visit
Admission: RE | Admit: 2016-04-23 | Discharge: 2016-04-23 | Disposition: A | Discharge status: Home / Self Care | Payer: 59 | Source: Ambulatory Visit | Attending: Family | Admitting: Family

## 2016-04-23 ENCOUNTER — Encounter: Payer: Self-pay | Admitting: Family

## 2016-04-23 ENCOUNTER — Telehealth: Payer: Self-pay | Admitting: *Deleted

## 2016-04-23 ENCOUNTER — Ambulatory Visit (INDEPENDENT_AMBULATORY_CARE_PROVIDER_SITE_OTHER): Payer: 59 | Admitting: Family

## 2016-04-23 VITALS — BP 144/88 | HR 69 | Temp 98.0°F | Ht 62.0 in | Wt 136.0 lb

## 2016-04-23 DIAGNOSIS — R1033 Periumbilical pain: Secondary | ICD-10-CM | POA: Insufficient documentation

## 2016-04-23 DIAGNOSIS — R109 Unspecified abdominal pain: Secondary | ICD-10-CM | POA: Diagnosis not present

## 2016-04-23 LAB — CBC WITH DIFFERENTIAL/PLATELET
Basophils Absolute: 0 10*3/uL (ref 0.0–0.1)
Basophils Relative: 0.3 % (ref 0.0–3.0)
EOS ABS: 0.1 10*3/uL (ref 0.0–0.7)
Eosinophils Relative: 1.2 % (ref 0.0–5.0)
HCT: 38.6 % (ref 36.0–46.0)
HEMOGLOBIN: 13.1 g/dL (ref 12.0–15.0)
LYMPHS ABS: 1.9 10*3/uL (ref 0.7–4.0)
Lymphocytes Relative: 35.5 % (ref 12.0–46.0)
MCHC: 33.9 g/dL (ref 30.0–36.0)
MCV: 88.5 fl (ref 78.0–100.0)
MONO ABS: 0.4 10*3/uL (ref 0.1–1.0)
Monocytes Relative: 7.9 % (ref 3.0–12.0)
Neutro Abs: 3 10*3/uL (ref 1.4–7.7)
Neutrophils Relative %: 55.1 % (ref 43.0–77.0)
Platelets: 276 10*3/uL (ref 150.0–400.0)
RBC: 4.36 Mil/uL (ref 3.87–5.11)
RDW: 12.7 % (ref 11.5–15.5)
WBC: 5.5 10*3/uL (ref 4.0–10.5)

## 2016-04-23 LAB — POCT URINALYSIS DIPSTICK
BILIRUBIN UA: NEGATIVE
Glucose, UA: NEGATIVE
KETONES UA: NEGATIVE
LEUKOCYTES UA: NEGATIVE
Nitrite, UA: NEGATIVE
PH UA: 6 (ref 5.0–8.0)
Spec Grav, UA: 1.005 (ref 1.030–1.035)
Urobilinogen, UA: 0.2 (ref ?–2.0)

## 2016-04-23 LAB — COMPREHENSIVE METABOLIC PANEL
ALBUMIN: 4.7 g/dL (ref 3.5–5.2)
ALK PHOS: 83 U/L (ref 39–117)
ALT: 12 U/L (ref 0–35)
AST: 15 U/L (ref 0–37)
BUN: 12 mg/dL (ref 6–23)
CALCIUM: 9.7 mg/dL (ref 8.4–10.5)
CO2: 30 mEq/L (ref 19–32)
Chloride: 103 mEq/L (ref 96–112)
Creatinine, Ser: 0.77 mg/dL (ref 0.40–1.20)
GFR: 86.67 mL/min (ref >60.00)
Glucose, Bld: 77 mg/dL (ref 70–99)
POTASSIUM: 3.8 meq/L (ref 3.5–5.1)
Sodium: 140 mEq/L (ref 135–145)
TOTAL PROTEIN: 7.8 g/dL (ref 6.0–8.3)
Total Bilirubin: 0.4 mg/dL (ref 0.2–1.2)

## 2016-04-23 LAB — LIPASE: LIPASE: 17 U/L (ref 11.0–59.0)

## 2016-04-23 MED ORDER — IOPAMIDOL (ISOVUE-300) INJECTION 61%
100.0000 mL | Freq: Once | INTRAVENOUS | Status: AC | PRN
  Administered 2016-04-23: 100 mL via INTRAVENOUS

## 2016-04-23 NOTE — Progress Notes (Signed)
Pre visit review using our clinic review tool, if applicable. No additional management support is needed unless otherwise documented below in the visit note. 

## 2016-04-23 NOTE — Telephone Encounter (Signed)
FYI [facility] CT Scan reported that the CT results were ready to read.

## 2016-04-23 NOTE — Patient Instructions (Signed)
Keep phone on you while we are in this acute work up phase  Will call with all results

## 2016-04-23 NOTE — Addendum Note (Signed)
Addended by: Arby Barrette on: 04/23/2016 09:12 AM   Modules accepted: Orders

## 2016-04-23 NOTE — Addendum Note (Signed)
Addended by: Burnard Hawthorne on: 04/23/2016 09:12 AM   Modules accepted: Orders

## 2016-04-23 NOTE — Progress Notes (Signed)
Subjective:    Patient ID: Connie Francis, female    DOB: [birth year], 46 y.o.   MRN: [id number]  CC: Connie Francis is a 46 y.o. female who presents today for an acute visit.    HPI: CC: abdominal pain near umbilicus which radiates to right upper quadrant, x one month, unchanged. Intermittent, worse with exercise or tying shoes. Initally thought it was a UTI when tested herself at [facility]- took cipro 2 weeks ago. UC came back clear 3/26 and stopped the cipro. Describes pain as ache, cramping. Not a/w food.   Has IBS H/o renal stone, diverticula ( no diverticulitis history).  h/o hysterectomy; C/S  Nausea, suprapubic pressure, chills. Last BM yesterday, formed, hard.  No blood in stool, fever, dysuria, diarrhea, dyspepsia.  Tried tylenol, ibuprofen with some relief.        colonoscopy 2016, no polyps EGD- gastritis, hiatal hernia.    HISTORY:  Past Medical History:  Diagnosis Date  . Abdominal bloating   . Anxiety   . Breast cyst    left breast, follow up with [facility]  . 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 CHF (congestive heart failure) ([place]) 2006  . Chronic epigastric pain   . Constipation   . GERD (gastroesophageal reflux disease)   . Hiatal hernia 2017  . IBS (irritable bowel syndrome)   . Kidney stones   . Lump or mass in breast    R-Breast  . Migraine   . Obsessive compulsive disorder   . Thyroid disease    hyper   Past Surgical History:  Procedure Laterality Date  . BREAST CYST ASPIRATION Left    negative 2010  . CESAREAN SECTION    . COLONOSCOPY WITH PROPOFOL N/A 07/27/2014   Procedure: COLONOSCOPY WITH PROPOFOL;  Surgeon: Lucilla Lame, MD;  Location: [facility];  Service: Endoscopy;  Laterality: N/A;  . colonscopy  07/27/2015  . DILATION AND CURETTAGE OF UTERUS  0923   complicated by heart failure  . ESOPHAGOGASTRODUODENOSCOPY (EGD) WITH PROPOFOL N/A 07/27/2014   Procedure: ESOPHAGOGASTRODUODENOSCOPY (EGD) WITH PROPOFOL;   Surgeon: Lucilla Lame, MD;  Location: [facility];  Service: Endoscopy;  Laterality: N/A;  . TUBAL LIGATION    . UPPER ENDOSCOPY W/ ESOPHAGEAL MANOMETRY    . VAGINAL HYSTERECTOMY     Family History  Problem Relation Age of Onset  . Cancer Maternal Aunt     parotid Ca  . Hearing loss Maternal Grandmother   . Alcohol abuse Maternal Grandmother   . Hypertension Mother   . Stroke Paternal Aunt   . Cancer Paternal Uncle     lung, nasopharnygeal  . Arthritis Maternal Grandfather   . Esophageal cancer Cousin 41  . Colon cancer Neg Hx   . Liver disease Neg Hx   . Breast cancer Neg Hx   . Ovarian cancer Neg Hx   . Diabetes Neg Hx     Allergies: Latex; Penicillins; Promethazine; Shellfish allergy; Sulfa antibiotics; and Lamictal [lamotrigine] Current Outpatient Prescriptions on File Prior to Visit  Medication Sig Dispense Refill  . ALPRAZolam (XANAX) 0.25 MG tablet Take 0.25 mg by mouth 2 (two) times daily as needed for anxiety.    . citalopram (CELEXA) 10 MG tablet Take 10 mg by mouth daily.    . sertraline (ZOLOFT) 50 MG tablet Take 50 mg by mouth daily.     No current facility-administered medications on file prior to visit.     Social History  Substance Use Topics  . Smoking  status: Never Smoker  . Smokeless tobacco: Never Used  . Alcohol use 0.6 oz/week    1 Glasses of wine per week     Comment: Rare  4  Review of Systems  Constitutional: Negative for chills and fever.  Respiratory: Negative for cough.   Cardiovascular: Negative for chest pain and palpitations.  Gastrointestinal: Positive for abdominal distention, abdominal pain and nausea. Negative for blood in stool, constipation, diarrhea and vomiting.      Objective:    BP (!) 144/88   Pulse 69   Temp 98 F (36.7 C) (Oral)   Ht 5\' 2"  (1.575 m)   Wt 136 lb (61.7 kg)   SpO2 96%   BMI 24.87 kg/m    Physical Exam  Constitutional: She appears well-developed and well-nourished.  Eyes: Conjunctivae are  normal.  Cardiovascular: Normal rate, regular rhythm, normal heart sounds and normal pulses.   Pulmonary/Chest: Effort normal and breath sounds normal. She has no wheezes. She has no rhonchi. She has no rales.  Abdominal: Soft. Normal appearance and bowel sounds are normal. She exhibits no distension, no fluid wave, no ascites and no mass. There is no tenderness. There is no rigidity, no rebound, no guarding and no CVA tenderness.  Neurological: She is alert.  Skin: Skin is warm and dry.  Psychiatric: She has a normal mood and affect. Her speech is normal and behavior is normal. Thought content normal.  Vitals reviewed.      Assessment & Plan:  1. Periumbilical abdominal pain Afebrile. Patient is very well appearing. I'm reassured by benign abdominal exam. However due to duration of symptoms and patient's description of periumbilical pain,  Concern for gallbladder etiology, atypical appendicitis. She also has a h/o renal stones. we jointly agreed that imaging would be an appropriate next step. If work up is benign, we discussed likely IBS.   - CBC with Differential/Platelet - Comprehensive metabolic panel - Lipase - POCT urinalysis dipstick - CT ABDOMEN PELVIS W CONTRAST     I have discontinued Ms. Romanello's metoprolol succinate, azithromycin, azithromycin, chlorpheniramine-HYDROcodone, and ciprofloxacin. I am also having her maintain her ALPRAZolam, sertraline, and citalopram.   No orders of the defined types were placed in this encounter.   Return precautions given.   Risks, benefits, and alternatives of the medications and treatment plan prescribed today were discussed, and patient expressed understanding.   Education regarding symptom management and diagnosis given to patient on AVS.  Continue to follow with TULLO, Aris Everts, MD for routine health maintenance.   [place] and I agreed with plan.   Mable Paris, FNP

## 2016-04-23 NOTE — Telephone Encounter (Signed)
FYI

## 2016-04-24 LAB — URINE CULTURE: Organism ID, Bacteria: NO GROWTH

## 2016-04-25 ENCOUNTER — Telehealth: Payer: Self-pay | Admitting: Family

## 2016-04-25 NOTE — Telephone Encounter (Signed)
Call pt to check on her  Seen earlier this week and evaluation didn't reveal any acute processes for abdominal pain  Is she doing better?  Would you she like a referral to GI?

## 2016-04-25 NOTE — Telephone Encounter (Signed)
Called and spoken to patient, Patient is still having episodes of pain and diarrhea.  Patient stated she did not want a referral just yet because she is being followed already with someone from [facility].  Patient did state that if SX become worse she will call [facility] to be seen earlier if needed.

## 2016-04-29 NOTE — Telephone Encounter (Signed)
Noted! Thank you

## 2016-05-24 DIAGNOSIS — F4312 Post-traumatic stress disorder, chronic: Secondary | ICD-10-CM | POA: Diagnosis not present

## 2016-05-24 DIAGNOSIS — F411 Generalized anxiety disorder: Secondary | ICD-10-CM | POA: Diagnosis not present

## 2016-05-24 DIAGNOSIS — F4001 Agoraphobia with panic disorder: Secondary | ICD-10-CM | POA: Diagnosis not present

## 2016-05-24 DIAGNOSIS — F330 Major depressive disorder, recurrent, mild: Secondary | ICD-10-CM | POA: Diagnosis not present

## 2016-06-11 ENCOUNTER — Ambulatory Visit: Payer: 59 | Admitting: Internal Medicine

## 2016-06-13 ENCOUNTER — Ambulatory Visit: Payer: Self-pay | Admitting: Physician Assistant

## 2016-06-13 ENCOUNTER — Encounter: Payer: Self-pay | Admitting: Physician Assistant

## 2016-06-13 VITALS — BP 130/90 | HR 83 | Temp 98.8°F

## 2016-06-13 DIAGNOSIS — J069 Acute upper respiratory infection, unspecified: Secondary | ICD-10-CM

## 2016-06-13 MED ORDER — DOXYCYCLINE HYCLATE 100 MG PO TABS: 100.0000 mg | ORAL_TABLET | Freq: Two times a day (BID) | ORAL | 0 refills | Status: DC

## 2016-06-13 MED ORDER — FLUCONAZOLE 150 MG PO TABS
150.0000 mg | ORAL_TABLET | Freq: Once | ORAL | 0 refills | Status: AC
Start: 2016-06-13 — End: 2016-06-13

## 2016-06-13 NOTE — Progress Notes (Signed)
S: C/o headache, sore throat, pnd, bodyaches and congestion for 3 days, + fever, chills, denies cp/sob, v/d; also pulled a tick off of her husband hasn't seen one on her   Using otc meds: ibuprofen, mucinex  O: PE: vitals wnl, nad, perrl eomi, normocephalic, tms dull, nasal mucosa red and swollen, throat injected, neck supple no lymph, lungs c t a, cv rrr, neuro intact  A:  Acute uri   P: drink fluids, continue regular meds , use otc meds of choice, return if not improving in 5 days, return earlier if worsening , doxy 100mg  bid, diflucan

## 2016-08-05 ENCOUNTER — Other Ambulatory Visit: Payer: Self-pay

## 2016-08-05 DIAGNOSIS — Z1231 Encounter for screening mammogram for malignant neoplasm of breast: Secondary | ICD-10-CM

## 2016-08-26 ENCOUNTER — Telehealth: Payer: 59 | Admitting: Family

## 2016-08-26 DIAGNOSIS — J019 Acute sinusitis, unspecified: Secondary | ICD-10-CM | POA: Diagnosis not present

## 2016-08-26 MED ORDER — DOXYCYCLINE HYCLATE 100 MG PO TABS
100.0000 mg | ORAL_TABLET | Freq: Two times a day (BID) | ORAL | 0 refills | Status: DC
Start: 2016-08-26 — End: 2016-09-02

## 2016-08-26 NOTE — Progress Notes (Signed)

## 2016-08-28 DIAGNOSIS — K581 Irritable bowel syndrome with constipation: Secondary | ICD-10-CM | POA: Diagnosis not present

## 2016-08-28 DIAGNOSIS — K5909 Other constipation: Secondary | ICD-10-CM | POA: Diagnosis not present

## 2016-09-02 ENCOUNTER — Ambulatory Visit (INDEPENDENT_AMBULATORY_CARE_PROVIDER_SITE_OTHER): Payer: 59 | Admitting: Internal Medicine

## 2016-09-02 ENCOUNTER — Encounter: Payer: Self-pay | Admitting: Internal Medicine

## 2016-09-02 VITALS — BP 126/84 | HR 81 | Temp 98.6°F | Resp 14 | Ht 62.0 in | Wt 136.6 lb

## 2016-09-02 DIAGNOSIS — I10 Essential (primary) hypertension: Secondary | ICD-10-CM | POA: Diagnosis not present

## 2016-09-02 DIAGNOSIS — K582 Mixed irritable bowel syndrome: Secondary | ICD-10-CM

## 2016-09-02 DIAGNOSIS — R030 Elevated blood-pressure reading, without diagnosis of hypertension: Secondary | ICD-10-CM | POA: Diagnosis not present

## 2016-09-02 MED ORDER — AMLODIPINE BESYLATE 2.5 MG PO TABS: 2.5000 mg | ORAL_TABLET | Freq: Every day | ORAL | 0 refills | Status: DC

## 2016-09-02 NOTE — Patient Instructions (Addendum)
Your blood pressure readings are BORDERLINE  I want you to walk for 30 minutes 5 days per week   CHECKING YOUR URINE TODAY FOR PROTEIN.  IF there is protein in it,  We will start losartan.   If not,  You can use the amlodipine 2.5 mg daily   You can start this if your blood pressure is consistently > 130/80   DASH Eating Plan DASH stands for "Dietary Approaches to Stop Hypertension." The DASH eating plan is a healthy eating plan that has been shown to reduce high blood pressure (hypertension). It may also reduce your risk for type 2 diabetes, heart disease, and stroke. The DASH eating plan may also help with weight loss. What are tips for following this plan? General guidelines  Avoid eating more than 2,300 mg (milligrams) of salt (sodium) a day. If you have hypertension, you may need to reduce your sodium intake to 1,500 mg a day.  Limit alcohol intake to no more than 1 drink a day for nonpregnant women and 2 drinks a day for men. One drink equals 12 oz of beer, 5 oz of wine, or 1 oz of hard liquor.  Work with your health care provider to maintain a healthy body weight or to lose weight. Ask what an ideal weight is for you.  Get at least 30 minutes of exercise that causes your heart to beat faster (aerobic exercise) most days of the week. Activities may include walking, swimming, or biking.  Work with your health care provider or diet and nutrition specialist (dietitian) to adjust your eating plan to your individual calorie needs. Reading food labels  Check food labels for the amount of sodium per serving. Choose foods with less than 5 percent of the Daily Value of sodium. Generally, foods with less than 300 mg of sodium per serving fit into this eating plan.  To find whole grains, look for the word "whole" as the first word in the ingredient list. Shopping  Buy products labeled as "low-sodium" or "no salt added."  Buy fresh foods. Avoid canned foods and premade or frozen  meals. Cooking  Avoid adding salt when cooking. Use salt-free seasonings or herbs instead of table salt or sea salt. Check with your health care provider or pharmacist before using salt substitutes.  Do not fry foods. Cook foods using healthy methods such as baking, boiling, grilling, and broiling instead.  Cook with heart-healthy oils, such as olive, canola, soybean, or sunflower oil. Meal planning   Eat a balanced diet that includes: ? 5 or more servings of fruits and vegetables each day. At each meal, try to fill half of your plate with fruits and vegetables. ? Up to 6-8 servings of whole grains each day. ? Less than 6 oz of lean meat, poultry, or fish each day. A 3-oz serving of meat is about the same size as a deck of cards. One egg equals 1 oz. ? 2 servings of low-fat dairy each day. ? A serving of nuts, seeds, or beans 5 times each week. ? Heart-healthy fats. Healthy fats called Omega-3 fatty acids are found in foods such as flaxseeds and coldwater fish, like sardines, salmon, and mackerel.  Limit how much you eat of the following: ? Canned or prepackaged foods. ? Food that is high in trans fat, such as fried foods. ? Food that is high in saturated fat, such as fatty meat. ? Sweets, desserts, sugary drinks, and other foods with added sugar. ? Full-fat dairy products.  Do not salt foods before eating.  Try to eat at least 2 vegetarian meals each week.  Eat more home-cooked food and less restaurant, buffet, and fast food.  When eating at a restaurant, ask that your food be prepared with less salt or no salt, if possible. What foods are recommended? The items listed may not be a complete list. Talk with your dietitian about what dietary choices are best for you. Grains Whole-grain or whole-wheat bread. Whole-grain or whole-wheat pasta. Brown rice. [place]ena Morrow. Bulgur. Whole-grain and low-sodium cereals. Pita bread. Low-fat, low-sodium crackers. Whole-wheat flour  tortillas. Vegetables Fresh or frozen vegetables (raw, steamed, roasted, or grilled). Low-sodium or reduced-sodium tomato and vegetable juice. Low-sodium or reduced-sodium tomato sauce and tomato paste. Low-sodium or reduced-sodium canned vegetables. Fruits All fresh, dried, or frozen fruit. Canned fruit in natural juice (without added sugar). Meat and other protein foods Skinless chicken or [place]. Ground chicken or [place]. Pork with fat trimmed off. Fish and seafood. Egg whites. Dried beans, peas, or lentils. Unsalted nuts, nut butters, and seeds. Unsalted canned beans. Lean cuts of beef with fat trimmed off. Low-sodium, lean deli meat. Dairy Low-fat (1%) or fat-free (skim) milk. Fat-free, low-fat, or reduced-fat cheeses. Nonfat, low-sodium ricotta or cottage cheese. Low-fat or nonfat yogurt. Low-fat, low-sodium cheese. Fats and oils Soft margarine without trans fats. Vegetable oil. Low-fat, reduced-fat, or light mayonnaise and salad dressings (reduced-sodium). Canola, safflower, olive, soybean, and sunflower oils. Avocado. Seasoning and other foods Herbs. Spices. Seasoning mixes without salt. Unsalted popcorn and pretzels. Fat-free sweets. What foods are not recommended? The items listed may not be a complete list. Talk with your dietitian about what dietary choices are best for you. Grains Baked goods made with fat, such as croissants, muffins, or some breads. Dry pasta or rice meal packs. Vegetables Creamed or fried vegetables. Vegetables in a cheese sauce. Regular canned vegetables (not low-sodium or reduced-sodium). Regular canned tomato sauce and paste (not low-sodium or reduced-sodium). Regular tomato and vegetable juice (not low-sodium or reduced-sodium). Angie Fava. Olives. Fruits Canned fruit in a light or heavy syrup. Fried fruit. Fruit in cream or butter sauce. Meat and other protein foods Fatty cuts of meat. Ribs. Fried meat. Berniece Salines. Sausage. Bologna and other processed lunch meats.  Salami. Fatback. Hotdogs. Bratwurst. Salted nuts and seeds. Canned beans with added salt. Canned or smoked fish. Whole eggs or egg yolks. Chicken or [place] with skin. Dairy Whole or 2% milk, cream, and half-and-half. Whole or full-fat cream cheese. Whole-fat or sweetened yogurt. Full-fat cheese. Nondairy creamers. Whipped toppings. Processed cheese and cheese spreads. Fats and oils Butter. Stick margarine. Lard. Shortening. Ghee. Bacon fat. Tropical oils, such as coconut, palm kernel, or palm oil. Seasoning and other foods Salted popcorn and pretzels. Onion salt, garlic salt, seasoned salt, table salt, and sea salt. Worcestershire sauce. Tartar sauce. Barbecue sauce. Teriyaki sauce. Soy sauce, including reduced-sodium. Steak sauce. Canned and packaged gravies. Fish sauce. Oyster sauce. Cocktail sauce. Horseradish that you find on the shelf. Ketchup. Mustard. Meat flavorings and tenderizers. Bouillon cubes. Hot sauce and Tabasco sauce. Premade or packaged marinades. Premade or packaged taco seasonings. Relishes. Regular salad dressings. Where to find more information:  National Heart, Lung, and [place]: https://wilson-eaton.com/  American Heart Association: www.heart.org Summary  The DASH eating plan is a healthy eating plan that has been shown to reduce high blood pressure (hypertension). It may also reduce your risk for type 2 diabetes, heart disease, and stroke.  With the DASH eating plan, you should limit salt (sodium)  intake to 2,300 mg a day. If you have hypertension, you may need to reduce your sodium intake to 1,500 mg a day.  When on the DASH eating plan, aim to eat more fresh fruits and vegetables, whole grains, lean proteins, low-fat dairy, and heart-healthy fats.  Work with your health care provider or diet and nutrition specialist (dietitian) to adjust your eating plan to your individual calorie needs. This information is not intended to replace advice given to you by your health  care provider. Make sure you discuss any questions you have with your health care provider. Document Released: 12/20/2010 Document Revised: 12/25/2015 Document Reviewed: 12/25/2015 Elsevier Interactive Patient Education  2017 [employer].

## 2016-09-02 NOTE — Progress Notes (Signed)
Subjective:  Patient ID: Connie Francis, female    DOB: Mar 26, 1970  Age: 46 y.o. MRN: [id number]  CC: The primary encounter diagnosis was Elevated blood pressure reading. Diagnoses of Elevated blood pressure reading with diagnosis of hypertension and Irritable bowel syndrome with both constipation and diarrhea were also pertinent to this visit.  HPI Connie Francis presents for follow up on new onset hypertension noted during recent GI visit.  Patient was noted to have a BP of  150/96.  Had been taking doxycycline and Mucinex D at the time  Of evaluation.  She has been checking her BPs at home and at work and all readings have been < 140/80 and > 127/85.  She denies LE edema.  Chest pain and shortness of breath.  Not exercising or walking regularly.     Mammogram due in sept   GAD: managed by Dr Nicolasa Ducking,  sympotms aggravated by job stressors.    Outpatient Medications Prior to Visit  Medication Sig Dispense Refill  . ALPRAZolam (XANAX) 0.25 MG tablet Take 0.25 mg by mouth 2 (two) times daily as needed for anxiety.    . citalopram (CELEXA) 10 MG tablet Take 10 mg by mouth daily.    . sertraline (ZOLOFT) 50 MG tablet Take 50 mg by mouth daily.    Marland Kitchen doxycycline (VIBRA-TABS) 100 MG tablet Take 1 tablet (100 mg total) by mouth 2 (two) times daily. (Patient not taking: Reported on 09/02/2016) 20 tablet 0   No facility-administered medications prior to visit.     Review of Systems;  Patient denies headache, fevers, malaise, unintentional weight loss, skin rash, eye pain, sinus congestion and sinus pain, sore throat, dysphagia,  hemoptysis , cough, dyspnea, wheezing, chest pain, palpitations, orthopnea, edema, abdominal pain, nausea, melena, diarrhea, constipation, flank pain, dysuria, hematuria, urinary  Frequency, nocturia, numbness, tingling, seizures,  Focal weakness, Loss of consciousness,  Tremor, insomnia, depression, anxiety, and suicidal ideation.      Objective:  BP 126/84  (BP Location: Left Arm, Patient Position: Sitting, Cuff Size: Normal)   Pulse 81   Temp 98.6 F (37 C) (Oral)   Resp 14   Ht 5\' 2"  (1.575 m)   Wt 136 lb 9.6 oz (62 kg)   SpO2 98%   BMI 24.98 kg/m   BP Readings from Last 3 Encounters:  09/02/16 126/84  06/13/16 130/90  04/23/16 (!) 144/88    Wt Readings from Last 3 Encounters:  09/02/16 136 lb 9.6 oz (62 kg)  04/23/16 136 lb (61.7 kg)  02/24/16 135 lb (61.2 kg)    General appearance: alert, cooperative and appears stated age Ears: normal TM's and external ear canals both ears Throat: lips, mucosa, and tongue normal; teeth and gums normal Neck: no adenopathy, no carotid bruit, supple, symmetrical, trachea midline and thyroid not enlarged, symmetric, no tenderness/mass/nodules Back: symmetric, no curvature. ROM normal. No CVA tenderness. Lungs: clear to auscultation bilaterally Heart: regular rate and rhythm, S1, S2 normal, no murmur, click, rub or gallop Abdomen: soft, non-tender; bowel sounds normal; no masses,  no organomegaly Pulses: 2+ and symmetric Skin: Skin color, texture, turgor normal. No rashes or lesions Lymph nodes: Cervical, supraclavicular, and axillary nodes normal.  Lab Results  Component Value Date   HGBA1C 4.8 07/20/2015   HGBA1C 5.3 12/05/2014    Lab Results  Component Value Date   CREATININE 0.77 04/23/2016   CREATININE 0.66 07/20/2015   CREATININE 0.6 12/05/2014    Lab Results  Component Value Date   WBC  5.5 04/23/2016   HGB 13.1 04/23/2016   HCT 38.6 04/23/2016   PLT 276.0 04/23/2016   GLUCOSE 77 04/23/2016   CHOL 188 07/20/2015   TRIG 53.0 07/20/2015   HDL 67.50 07/20/2015   LDLCALC 110 (H) 07/20/2015   ALT 12 04/23/2016   AST 15 04/23/2016   NA 140 04/23/2016   K 3.8 04/23/2016   CL 103 04/23/2016   CREATININE 0.77 04/23/2016   BUN 12 04/23/2016   CO2 30 04/23/2016   TSH 0.68 07/20/2015   HGBA1C 4.8 07/20/2015   MICROALBUR 0.8 09/02/2016    Ct Abdomen Pelvis W  Contrast  Result Date: 04/23/2016 CLINICAL DATA:  Abdominal pain and nausea. EXAM: CT ABDOMEN AND PELVIS WITH CONTRAST TECHNIQUE: Multidetector CT imaging of the abdomen and pelvis was performed using the standard protocol following bolus administration of intravenous contrast. CONTRAST:  168mL ISOVUE-300 IOPAMIDOL (ISOVUE-300) INJECTION 61% COMPARISON:  CT scan dated 07/21/2014 FINDINGS: Lower chest: Normal. Hepatobiliary: No focal liver abnormality is seen. No gallstones, gallbladder wall thickening, or biliary dilatation. Pancreas: Unremarkable. No pancreatic ductal dilatation or surrounding inflammatory changes. Spleen: Normal in size without focal abnormality. Adrenals/Urinary Tract: Adrenal glands are normal. Cyst present on the prior study in the upper pole of the left kidney has collapsed. Kidneys are otherwise normal. Ureters and bladder appear normal. Stomach/Bowel: Stomach is within normal limits. Appendix appears normal. No evidence of bowel wall thickening, distention, or inflammatory changes. Vascular/Lymphatic: No significant vascular findings are present. No enlarged abdominal or pelvic lymph nodes. Reproductive: Uterus is absent. Ovaries appear normal. Slightly dilated fallopian tubes bilaterally are unchanged since the prior study. Other: No abdominal wall hernia or abnormality. No abdominopelvic ascites. Musculoskeletal: No acute or significant osseous findings. IMPRESSION: Benign-appearing abdomen and pelvis. No change since the prior study. Electronically Signed   By: Lorriane Shire M.D.   On: 04/23/2016 12:00    Assessment & Plan:   Problem List Items Addressed This Visit    Elevated blood pressure reading with diagnosis of hypertension    Recent elevation occurring in the setting of acute illness and use of decongestant .  Home readings are lower but still borderline.renal function and urinalysis normal.  No medications unless readings are consistently > 140/80 (prn amlodipine).  DASH  diet given   Lab Results  Component Value Date   CREATININE 0.77 04/23/2016   Lab Results  Component Value Date   MICROALBUR 0.8 09/02/2016    Lab Results  Component Value Date   NA 140 04/23/2016   K 3.8 04/23/2016   CL 103 04/23/2016   CO2 30 04/23/2016         Relevant Medications   amLODipine (NORVASC) 2.5 MG tablet   IBS (irritable bowel syndrome)    Improved with avoidance of red meat and daily use of miralax.  Prn amitiza used.   Dietary recommendations made..      Relevant Medications   linaclotide (LINZESS) 72 MCG capsule   polyethylene glycol (MIRALAX / GLYCOLAX) packet    Other Visit Diagnoses    Elevated blood pressure reading    -  Primary   Relevant Orders   Microalbumin / creatinine urine ratio (Completed)     A total of 25 minutes of face to face time was spent with patient more than half of which was spent in counselling about the above mentioned conditions  and coordination of care  I have discontinued Ms. Gorgas's doxycycline. I am also having her start on amLODipine. Additionally, I am having  her maintain her ALPRAZolam, sertraline, citalopram, linaclotide, and polyethylene glycol.  Meds ordered this encounter  Medications  . linaclotide (LINZESS) 72 MCG capsule    Sig: Take by mouth.  . polyethylene glycol (MIRALAX / GLYCOLAX) packet    Sig: Take by mouth.  Marland Kitchen amLODipine (NORVASC) 2.5 MG tablet    Sig: Take 1 tablet (2.5 mg total) by mouth daily.    Dispense:  30 tablet    Refill:  0    Medications Discontinued During This Encounter  Medication Reason  . doxycycline (VIBRA-TABS) 100 MG tablet Patient has not taken in last 30 days    Follow-up: Return in about 6 months (around 03/05/2017).   Crecencio Mc, MD

## 2016-09-03 LAB — MICROALBUMIN / CREATININE URINE RATIO
Creatinine,U: 138.7 mg/dL
MICROALB UR: 0.8 mg/dL (ref 0.0–1.9)
Microalb Creat Ratio: 0.6 mg/g (ref 0.0–30.0)

## 2016-09-03 NOTE — Assessment & Plan Note (Addendum)
Improved with avoidance of red meat and daily use of miralax.  Prn amitiza used.   Dietary recommendations made.Marland Kitchen

## 2016-09-03 NOTE — Assessment & Plan Note (Addendum)
Recent elevation occurring in the setting of acute illness and use of decongestant .  Home readings are lower but still borderline.renal function and urinalysis normal.  No medications unless readings are consistently > 140/80 (prn amlodipine).  DASH diet given   Lab Results  Component Value Date   CREATININE 0.77 04/23/2016   Lab Results  Component Value Date   MICROALBUR 0.8 09/02/2016    Lab Results  Component Value Date   NA 140 04/23/2016   K 3.8 04/23/2016   CL 103 04/23/2016   CO2 30 04/23/2016

## 2016-09-04 ENCOUNTER — Encounter: Payer: Self-pay | Admitting: Internal Medicine

## 2016-09-20 ENCOUNTER — Ambulatory Visit
Admission: RE | Admit: 2016-09-20 | Discharge: 2016-09-20 | Disposition: A | Discharge status: Home / Self Care | Payer: 59 | Source: Ambulatory Visit | Attending: General Surgery | Admitting: General Surgery

## 2016-09-20 DIAGNOSIS — Z1231 Encounter for screening mammogram for malignant neoplasm of breast: Secondary | ICD-10-CM | POA: Insufficient documentation

## 2016-09-25 ENCOUNTER — Ambulatory Visit (INDEPENDENT_AMBULATORY_CARE_PROVIDER_SITE_OTHER): Payer: 59 | Admitting: General Surgery

## 2016-09-25 ENCOUNTER — Encounter: Payer: Self-pay | Admitting: General Surgery

## 2016-09-25 VITALS — BP 124/72 | HR 86 | Resp 12 | Ht 64.0 in | Wt 136.0 lb

## 2016-09-25 DIAGNOSIS — N6001 Solitary cyst of right breast: Secondary | ICD-10-CM | POA: Diagnosis not present

## 2016-09-25 NOTE — Patient Instructions (Signed)
Can return to PCP for breast mammogram checks

## 2016-09-25 NOTE — Progress Notes (Signed)
Patient ID: Connie Francis, female   DOB: Jul 05, 1970, 46 y.o.   MRN: [id number]  Chief Complaint  Patient presents with  . Follow-up    HPI Connie Francis is a 46 y.o. female who presents for a breast evaluation. The most recent mammogram was done on 09/20/2016.  Patient does perform regular self breast checks and gets regular mammograms done. She has a history of breast cysts and last aspiration was done in 2010 with negative results. Negative family history of breast cancer.   HPI  Past Medical History:  Diagnosis Date  . Abdominal bloating   . Anxiety   . 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 CHF (congestive heart failure) ([place]) 2006  . Chronic epigastric pain   . Constipation   . GERD (gastroesophageal reflux disease)   . Hiatal hernia 2017  . IBS (irritable bowel syndrome)   . Kidney stones   . Lump or mass in breast    R-Breast  . Migraine   . Obsessive compulsive disorder   . Thyroid disease    hyper    Past Surgical History:  Procedure Laterality Date  . BREAST CYST ASPIRATION Left    negative 2010  . CESAREAN SECTION    . COLONOSCOPY WITH PROPOFOL N/A 07/27/2014   Procedure: COLONOSCOPY WITH PROPOFOL;  Surgeon: Lucilla Lame, MD;  Location: [facility];  Service: Endoscopy;  Laterality: N/A;  . colonscopy  07/27/2015  . DILATION AND CURETTAGE OF UTERUS  4098   complicated by heart failure  . ESOPHAGOGASTRODUODENOSCOPY (EGD) WITH PROPOFOL N/A 07/27/2014   Procedure: ESOPHAGOGASTRODUODENOSCOPY (EGD) WITH PROPOFOL;  Surgeon: Lucilla Lame, MD;  Location: [facility];  Service: Endoscopy;  Laterality: N/A;  . TUBAL LIGATION    . UPPER ENDOSCOPY W/ ESOPHAGEAL MANOMETRY    . VAGINAL HYSTERECTOMY      Family History  Problem Relation Age of Onset  . Cancer Maternal Aunt        parotid Ca  . Hearing loss Maternal Grandmother   . Alcohol abuse Maternal Grandmother   . Hypertension Mother   . Stroke Paternal Aunt   . Cancer Paternal  Uncle        lung, nasopharnygeal  . Arthritis Maternal Grandfather   . Esophageal cancer Cousin 39  . Colon cancer Neg Hx   . Liver disease Neg Hx   . Breast cancer Neg Hx   . Ovarian cancer Neg Hx   . Diabetes Neg Hx     Social History Social History  Substance Use Topics  . Smoking status: Never Smoker  . Smokeless tobacco: Never Used  . Alcohol use 0.6 oz/week    1 Glasses of wine per week     Comment: Rare    Allergies  Allergen Reactions  . Latex     Swelling and itching   . Penicillins     swelling  . Promethazine   . Shellfish Allergy     hives  . Sulfa Antibiotics     Rash and itching   . Lamictal [Lamotrigine] Rash    Blisters     Current Outpatient Prescriptions  Medication Sig Dispense Refill  . ALPRAZolam (XANAX) 0.25 MG tablet Take 0.25 mg by mouth 2 (two) times daily as needed for anxiety.    Marland Kitchen amLODipine (NORVASC) 2.5 MG tablet Take 1 tablet (2.5 mg total) by mouth daily. 30 tablet 0  . citalopram (CELEXA) 10 MG tablet Take 10 mg by mouth daily.    Marland Kitchen linaclotide Rolan Lipa)  72 MCG capsule Take by mouth.    . polyethylene glycol (MIRALAX / GLYCOLAX) packet Take by mouth.    . sertraline (ZOLOFT) 50 MG tablet Take 50 mg by mouth daily.     No current facility-administered medications for this visit.     Review of Systems Review of Systems  Constitutional: Negative.   Respiratory: Negative.   Cardiovascular: Negative.     Blood pressure 124/72, pulse 86, resp. rate 12, height 5\' 4"  (1.626 m), weight 136 lb (61.7 kg).  Physical Exam Physical Exam  Constitutional: She is oriented to person, place, and time. She appears well-developed and well-nourished.  Eyes: Conjunctivae are normal. No scleral icterus.  Neck: Neck supple.  Cardiovascular: Normal rate, regular rhythm and normal heart sounds.   Pulmonary/Chest: Effort normal and breath sounds normal. Right breast exhibits no inverted nipple, no mass, no nipple discharge, no skin change and no  tenderness. Left breast exhibits no inverted nipple, no mass, no nipple discharge, no skin change and no tenderness.  Abdominal: Soft. Bowel sounds are normal.  Lymphadenopathy:    She has no cervical adenopathy.    She has no axillary adenopathy.  Neurological: She is alert and oriented to person, place, and time.  Skin: Skin is warm and dry.  Psychiatric: She has a normal mood and affect.    Data Reviewed Mammogram and prior notes reviewed.   Assessment    History of breast cysts . Exam stable    Plan    Can return to PCP for yearly mammogram and breast exam. Call here for any concerns or new issues with breast.  HPI, Physical Exam, Assessment and Plan have been scribed under the direction and in the presence of Mckinley Jewel, MD  Gaspar Cola, CMA  Gaspar Cola, CMA     I have completed the exam and reviewed the above documentation for accuracy and completeness.  I agree with the above.  Haematologist has been used and any errors in dictation or transcription are unintentional.  Seeplaputhur G. Jamal Collin, M.D., F.A.C.S.  I have completed the exam and reviewed the above documentation for accuracy and completeness.  I agree with the above.  Haematologist has been used and any errors in dictation or transcription are unintentional.  Seeplaputhur G. Jamal Collin, M.D., F.A.C.S.    Junie Panning G 09/25/2016, 9:56 AM

## 2016-10-18 DIAGNOSIS — F330 Major depressive disorder, recurrent, mild: Secondary | ICD-10-CM | POA: Diagnosis not present

## 2016-10-18 DIAGNOSIS — F4001 Agoraphobia with panic disorder: Secondary | ICD-10-CM | POA: Diagnosis not present

## 2016-10-18 DIAGNOSIS — F411 Generalized anxiety disorder: Secondary | ICD-10-CM | POA: Diagnosis not present

## 2016-10-18 DIAGNOSIS — F4312 Post-traumatic stress disorder, chronic: Secondary | ICD-10-CM | POA: Diagnosis not present

## 2016-10-29 ENCOUNTER — Telehealth: Payer: 59 | Admitting: Family

## 2016-10-29 DIAGNOSIS — J019 Acute sinusitis, unspecified: Secondary | ICD-10-CM | POA: Diagnosis not present

## 2016-10-29 DIAGNOSIS — B9689 Other specified bacterial agents as the cause of diseases classified elsewhere: Secondary | ICD-10-CM

## 2016-10-29 MED ORDER — AZITHROMYCIN 250 MG PO TABS: ORAL_TABLET | ORAL | 0 refills | Status: DC

## 2016-10-29 NOTE — Progress Notes (Signed)
We are sorry that you are not feeling well.  Here is how we plan to help!  Based on what you have shared with me it looks like you have sinusitis.  Sinusitis is inflammation and infection in the sinus cavities of the head.  Based on your presentation I believe you most likely have Acute Bacterial Sinusitis.  This is an infection caused by bacteria and is treated with antibiotics. I have prescribed Z-pak as directed. You may use an oral decongestant such as Mucinex D or if you have glaucoma or high blood pressure use plain Mucinex. Saline nasal spray help and can safely be used as often as needed for congestion.  If you develop worsening sinus pain, fever or notice severe headache and vision changes, or if symptoms are not better after completion of antibiotic, please schedule an appointment with a health care provider.    Sinus infections are not as easily transmitted as other respiratory infection, however we still recommend that you avoid close contact with loved ones, especially the very young and elderly.  Remember to wash your hands thoroughly throughout the day as this is the number one way to prevent the spread of infection!  Home Care:  Only take medications as instructed by your medical team.  Complete the entire course of an antibiotic.  Do not take these medications with alcohol.  A steam or ultrasonic humidifier can help congestion.  You can place a towel over your head and breathe in the steam from hot water coming from a faucet.  Avoid close contacts especially the very young and the elderly.  Cover your mouth when you cough or sneeze.  Always remember to wash your hands.  Get Help Right Away If:  You develop worsening fever or sinus pain.  You develop a severe head ache or visual changes.  Your symptoms persist after you have completed your treatment plan.  Make sure you  Understand these instructions.  Will watch your condition.  Will get help right away if you are  not doing well or get worse.  Your e-visit answers were reviewed by a board certified advanced clinical practitioner to complete your personal care plan.  Depending on the condition, your plan could have included both over the counter or prescription medications.  If there is a problem please reply  once you have received a response from your provider.  Your safety is important to us.  If you have drug allergies check your prescription carefully.    You can use MyChart to ask questions about today's visit, request a non-urgent call back, or ask for a work or school excuse for 24 hours related to this e-Visit. If it has been greater than 24 hours you will need to follow up with your provider, or enter a new e-Visit to address those concerns.  You will get an e-mail in the next two days asking about your experience.  I hope that your e-visit has been valuable and will speed your recovery. Thank you for using e-visits.   

## 2016-12-13 NOTE — Progress Notes (Signed)
GYN ANNUAL PREVENTATIVE CARE ENCOUNTER NOTE  Subjective:       Connie Francis is a 46 y.o. [id number] female here for a routine annual gynecologic exam.  Current complaints:   1.  History of endometriosis.  No current pelvic pain. 2. Dark area on labia- left side- ? mole; persisted for several months; was looked at by Dr. Kennon Rounds and thought to be possibly a shave related lesion; unchanging; asymptomatic 3. Night sweats only; no hot flashes  4. Irritation on the vaginal area; took antibiotics several weeks ago and has minimal discharge without itching or burning    Gynecologic History No LMP recorded. Patient has had a hysterectomy. Contraception: status post hysterectomy TVH Last Pap: Not needed Last mammogram: 09/2016 [facility] , followed by Dr. Jamal Collin, surgery  Obstetric History OB History  Gravida Para Term Preterm AB Living  3 2 2   1 2   SAB TAB Ectopic Multiple Live Births  1       2    # Outcome Date GA Lbr Len/2nd Weight Sex Delivery Anes PTL Lv  3 Term 2006    M CS-LTranv   LIV  2 Term 1995    M Vag-Spont   LIV  1 SAB 1995            Obstetric Comments  Menstrual: 59  First pregnancy at age 89    Past Medical History:  Diagnosis Date  . Abdominal bloating   . Anxiety   . 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 CHF (congestive heart failure) ([place]) 2006  . Chronic epigastric pain   . Constipation   . GERD (gastroesophageal reflux disease)   . Hiatal hernia 2017  . IBS (irritable bowel syndrome)   . Kidney stones   . Lump or mass in breast    R-Breast  . Migraine   . Obsessive compulsive disorder   . Thyroid disease    hyper    Past Surgical History:  Procedure Laterality Date  . BREAST CYST ASPIRATION Left    negative 2010  . CESAREAN SECTION    . COLONOSCOPY WITH PROPOFOL N/A 07/27/2014   Procedure: COLONOSCOPY WITH PROPOFOL;  Surgeon: Lucilla Lame, MD;  Location: [facility];  Service: Endoscopy;  Laterality: N/A;  . colonscopy   07/27/2015  . DILATION AND CURETTAGE OF UTERUS  5176   complicated by heart failure  . ESOPHAGOGASTRODUODENOSCOPY (EGD) WITH PROPOFOL N/A 07/27/2014   Procedure: ESOPHAGOGASTRODUODENOSCOPY (EGD) WITH PROPOFOL;  Surgeon: Lucilla Lame, MD;  Location: [facility];  Service: Endoscopy;  Laterality: N/A;  . TUBAL LIGATION    . UPPER ENDOSCOPY W/ ESOPHAGEAL MANOMETRY    . VAGINAL HYSTERECTOMY      Current Outpatient Medications on File Prior to Visit  Medication Sig Dispense Refill  . ALPRAZolam (XANAX) 0.25 MG tablet Take 0.25 mg by mouth 2 (two) times daily as needed for anxiety.    Marland Kitchen amLODipine (NORVASC) 2.5 MG tablet Take 1 tablet (2.5 mg total) by mouth daily. 30 tablet 0  . azithromycin (ZITHROMAX) 250 MG tablet zpak as directed 6 tablet 0  . citalopram (CELEXA) 10 MG tablet Take 10 mg by mouth daily.    Marland Kitchen linaclotide (LINZESS) 72 MCG capsule Take by mouth.    . polyethylene glycol (MIRALAX / GLYCOLAX) packet Take by mouth.    . sertraline (ZOLOFT) 50 MG tablet Take 50 mg by mouth daily.     No current facility-administered medications on file prior to visit.  Allergies  Allergen Reactions  . Latex     Swelling and itching   . Penicillins     swelling  . Promethazine   . Shellfish Allergy     hives  . Sulfa Antibiotics     Rash and itching   . Lamictal [Lamotrigine] Rash    Blisters     Social History   Socioeconomic History  . Marital status: Married    Spouse name: Not on file  . Number of children: Not on file  . Years of education: Not on file  . Highest education level: Not on file  Social Needs  . Financial resource strain: Not on file  . Food insecurity - worry: Not on file  . Food insecurity - inability: Not on file  . Transportation needs - medical: Not on file  . Transportation needs - non-medical: Not on file  Occupational History  . Not on file  Tobacco Use  . Smoking status: Never Smoker  . Smokeless tobacco: Never Used  Substance and  Sexual Activity  . Alcohol use: Yes    Alcohol/week: 0.6 oz    Types: 1 Glasses of wine per week    Comment: Rare  . Drug use: No  . Sexual activity: Yes    Birth control/protection: Surgical  Other Topics Concern  . Not on file  Social History Narrative  . Not on file    Family History  Problem Relation Age of Onset  . Cancer Maternal Aunt        parotid Ca  . Hearing loss Maternal Grandmother   . Alcohol abuse Maternal Grandmother   . Hypertension Mother   . Stroke Paternal Aunt   . Cancer Paternal Uncle        lung, nasopharnygeal  . Arthritis Maternal Grandfather   . Esophageal cancer Cousin 66  . Colon cancer Neg Hx   . Liver disease Neg Hx   . Breast cancer Neg Hx   . Ovarian cancer Neg Hx   . Diabetes Neg Hx     The following portions of the patient's history were reviewed and updated as appropriate: allergies, current medications, past family history, past medical history, past social history, past surgical history and problem list.  Review of Systems Review of Systems  Constitutional:       Night sweats. No hot flashes.  HENT: Negative.   Eyes: Negative.   Respiratory: Negative.   Cardiovascular: Negative.   Gastrointestinal:       History of IBS without significant change in symptoms  Genitourinary: Negative.   Skin:       Left labia majora lesion times 3 months, unchanging  Neurological: Negative.   Endo/Heme/Allergies: Negative.   Psychiatric/Behavioral: Negative.      Objective:  BP (!) 145/80   Pulse 66   Ht 5\' 2"  (1.575 m)   Wt 134 lb 1.6 oz (60.8 kg)   BMI 24.53 kg/m    Assessment:   Annual gynecologic examination 46 y.o. Contraception: status post TVH Normal BMI Patient Active Problem List   Diagnosis Date Noted  . Recurrent headache 01/06/2016  . Blurred vision, bilateral 01/06/2016  . IBS (irritable bowel syndrome) 07/17/2014  . S/P total hysterectomy 07/15/2014  . Cyst (solitary) of breast 07/12/2013  . Edema 01/18/2013   . Elevated blood pressure reading with diagnosis of hypertension 01/18/2013  . Psychogenic dyspepsia 11/27/2012  . Visit for preventive health examination 06/18/2012  . Acquired cyst of kidney 02/18/2012  . H/O urinary stone  02/18/2012  . Depression with anxiety 01/07/2012  . Adaptive colitis 12/06/2011  . Obsessive compulsive disorder   . Postpartum cardiomyopathy 09/13/2010     Plan:  Pap: Not needed Mammogram: negative (followed by general surgery); will began ordering mammograms next year as Dr. Jamal Collin is retiring Labs: followed by Dr. Derrel Nip [facility] is ordered today Stool Guaiac Testing:not indicated Routine preventative health maintenance measures emphasized: Diet/Weight control and Alcohol/Drug use Encourage increased water intake, Colace, high-fiber diet, Metamucil for chronic constipation/IBS Monitor blood pressure at home periodically Return to [place], CMA  Brayton Mars, MD  Note: This dictation was prepared with Dragon dictation along with smaller phrase technology. Any transcriptional errors that result from this process are unintentional.

## 2016-12-17 ENCOUNTER — Ambulatory Visit (INDEPENDENT_AMBULATORY_CARE_PROVIDER_SITE_OTHER): Payer: 59 | Admitting: Obstetrics and Gynecology

## 2016-12-17 ENCOUNTER — Encounter: Payer: Self-pay | Admitting: Obstetrics and Gynecology

## 2016-12-17 VITALS — BP 145/80 | HR 66 | Ht 62.0 in | Wt 134.1 lb

## 2016-12-17 DIAGNOSIS — N898 Other specified noninflammatory disorders of vagina: Secondary | ICD-10-CM

## 2016-12-17 DIAGNOSIS — Z90710 Acquired absence of both cervix and uterus: Secondary | ICD-10-CM | POA: Diagnosis not present

## 2016-12-17 DIAGNOSIS — R61 Generalized hyperhidrosis: Secondary | ICD-10-CM | POA: Diagnosis not present

## 2016-12-17 DIAGNOSIS — Z01419 Encounter for gynecological examination (general) (routine) without abnormal findings: Secondary | ICD-10-CM | POA: Diagnosis not present

## 2016-12-17 DIAGNOSIS — K5909 Other constipation: Secondary | ICD-10-CM | POA: Insufficient documentation

## 2016-12-17 DIAGNOSIS — N809 Endometriosis, unspecified: Secondary | ICD-10-CM | POA: Diagnosis not present

## 2016-12-17 HISTORY — DX: Endometriosis, unspecified: N80.9

## 2016-12-17 NOTE — Patient Instructions (Signed)
1.  No Pap smear taken. 2.  Mammograms per general surgery 3.  Screening labs are to be obtained through Dr. Derrel Nip, primary care 4.  Continue with healthy eating and exercise 5.  Return in 1 year for annual exam  Health Maintenance, Female Adopting a healthy lifestyle and getting preventive care can go a long way to promote health and wellness. Talk with your health care provider about what schedule of regular examinations is right for you. This is a good chance for you to check in with your provider about disease prevention and staying healthy. In between checkups, there are plenty of things you can do on your own. Experts have done a lot of research about which lifestyle changes and preventive measures are most likely to keep you healthy. Ask your health care provider for more information. Weight and diet Eat a healthy diet  Be sure to include plenty of vegetables, fruits, low-fat dairy products, and lean protein.  Do not eat a lot of foods high in solid fats, added sugars, or salt.  Get regular exercise. This is one of the most important things you can do for your health. ? Most adults should exercise for at least 150 minutes each week. The exercise should increase your heart rate and make you sweat (moderate-intensity exercise). ? Most adults should also do strengthening exercises at least twice a week. This is in addition to the moderate-intensity exercise.  Maintain a healthy weight  Body mass index (BMI) is a measurement that can be used to identify possible weight problems. It estimates body fat based on height and weight. Your health care provider can help determine your BMI and help you achieve or maintain a healthy weight.  For females 72 years of age and older: ? A BMI below 18.5 is considered underweight. ? A BMI of 18.5 to 24.9 is normal. ? A BMI of 25 to 29.9 is considered overweight. ? A BMI of 30 and above is considered obese.  Watch levels of cholesterol and blood  lipids  You should start having your blood tested for lipids and cholesterol at 44 years of age, then have this test every 5 years.  You may need to have your cholesterol levels checked more often if: ? Your lipid or cholesterol levels are high. ? You are older than 44 years of age. ? You are at high risk for heart disease.  Cancer screening Lung Cancer  Lung cancer screening is recommended for adults 72-64 years old who are at high risk for lung cancer because of a history of smoking.  A yearly low-dose CT scan of the lungs is recommended for people who: ? Currently smoke. ? Have quit within the past 15 years. ? Have at least a 30-pack-year history of smoking. A pack year is smoking an average of one pack of cigarettes a day for 1 year.  Yearly screening should continue until it has been 15 years since you quit.  Yearly screening should stop if you develop a health problem that would prevent you from having lung cancer treatment.  Breast Cancer  Practice breast self-awareness. This means understanding how your breasts normally appear and feel.  It also means doing regular breast self-exams. Let your health care provider know about any changes, no matter how small.  If you are in your 20s or 30s, you should have a clinical breast exam (CBE) by a health care provider every 1-3 years as part of a regular health exam.  If you are  12 or older, have a CBE every year. Also consider having a breast X-ray (mammogram) every year.  If you have a family history of breast cancer, talk to your health care provider about genetic screening.  If you are at high risk for breast cancer, talk to your health care provider about having an MRI and a mammogram every year.  Breast cancer gene (BRCA) assessment is recommended for women who have family members with BRCA-related cancers. BRCA-related cancers include: ? Breast. ? Ovarian. ? Tubal. ? Peritoneal cancers.  Results of the assessment will  determine the need for genetic counseling and BRCA1 and BRCA2 testing.  Cervical Cancer Your health care provider may recommend that you be screened regularly for cancer of the pelvic organs (ovaries, uterus, and vagina). This screening involves a pelvic examination, including checking for microscopic changes to the surface of your cervix (Pap test). You may be encouraged to have this screening done every 3 years, beginning at age 85.  For women ages 47-65, health care providers may recommend pelvic exams and Pap testing every 3 years, or they may recommend the Pap and pelvic exam, combined with testing for human papilloma virus (HPV), every 5 years. Some types of HPV increase your risk of cervical cancer. Testing for HPV may also be done on women of any age with unclear Pap test results.  Other health care providers may not recommend any screening for nonpregnant women who are considered low risk for pelvic cancer and who do not have symptoms. Ask your health care provider if a screening pelvic exam is right for you.  If you have had past treatment for cervical cancer or a condition that could lead to cancer, you need Pap tests and screening for cancer for at least 20 years after your treatment. If Pap tests have been discontinued, your risk factors (such as having a new sexual partner) need to be reassessed to determine if screening should resume. Some women have medical problems that increase the chance of getting cervical cancer. In these cases, your health care provider may recommend more frequent screening and Pap tests.  Colorectal Cancer  This type of cancer can be detected and often prevented.  Routine colorectal cancer screening usually begins at 44 years of age and continues through 44 years of age.  Your health care provider may recommend screening at an earlier age if you have risk factors for colon cancer.  Your health care provider may also recommend using home test kits to check  for hidden blood in the stool.  A small camera at the end of a tube can be used to examine your colon directly (sigmoidoscopy or colonoscopy). This is done to check for the earliest forms of colorectal cancer.  Routine screening usually begins at age 67.  Direct examination of the colon should be repeated every 5-10 years through 44 years of age. However, you may need to be screened more often if early forms of precancerous polyps or small growths are found.  Skin Cancer  Check your skin from head to toe regularly.  Tell your health care provider about any new moles or changes in moles, especially if there is a change in a mole's shape or color.  Also tell your health care provider if you have a mole that is larger than the size of a pencil eraser.  Always use sunscreen. Apply sunscreen liberally and repeatedly throughout the day.  Protect yourself by wearing long sleeves, pants, a wide-brimmed hat, and sunglasses whenever you  are outside.  Heart disease, diabetes, and high blood pressure  High blood pressure causes heart disease and increases the risk of stroke. High blood pressure is more likely to develop in: ? People who have blood pressure in the high end of the normal range (130-139/85-89 mm Hg). ? People who are overweight or obese. ? People who are African American.  If you are 77-66 years of age, have your blood pressure checked every 3-5 years. If you are 46 years of age or older, have your blood pressure checked every year. You should have your blood pressure measured twice-once when you are at a hospital or clinic, and once when you are not at a hospital or clinic. Record the average of the two measurements. To check your blood pressure when you are not at a hospital or clinic, you can use: ? An automated blood pressure machine at a pharmacy. ? A home blood pressure monitor.  If you are between 37 years and 59 years old, ask your health care provider if you should take  aspirin to prevent strokes.  Have regular diabetes screenings. This involves taking a blood sample to check your fasting blood sugar level. ? If you are at a normal weight and have a low risk for diabetes, have this test once every three years after 44 years of age. ? If you are overweight and have a high risk for diabetes, consider being tested at a younger age or more often. Preventing infection Hepatitis B  If you have a higher risk for hepatitis B, you should be screened for this virus. You are considered at high risk for hepatitis B if: ? You were born in a country where hepatitis B is common. Ask your health care provider which countries are considered high risk. ? Your parents were born in a high-risk country, and you have not been immunized against hepatitis B (hepatitis B vaccine). ? You have HIV or AIDS. ? You use needles to inject street drugs. ? You live with someone who has hepatitis B. ? You have had sex with someone who has hepatitis B. ? You get hemodialysis treatment. ? You take certain medicines for conditions, including cancer, organ transplantation, and autoimmune conditions.  Hepatitis C  Blood testing is recommended for: ? Everyone born from [birth year] through 1963. ? Anyone with known risk factors for hepatitis C.  Sexually transmitted infections (STIs)  You should be screened for sexually transmitted infections (STIs) including gonorrhea and chlamydia if: ? You are sexually active and are younger than 44 years of age. ? You are older than 44 years of age and your health care provider tells you that you are at risk for this type of infection. ? Your sexual activity has changed since you were last screened and you are at an increased risk for chlamydia or gonorrhea. Ask your health care provider if you are at risk.  If you do not have HIV, but are at risk, it may be recommended that you take a prescription medicine daily to prevent HIV infection. This is called  pre-exposure prophylaxis (PrEP). You are considered at risk if: ? You are sexually active and do not regularly use condoms or know the HIV status of your partner(s). ? You take drugs by injection. ? You are sexually active with a partner who has HIV.  Talk with your health care provider about whether you are at high risk of being infected with HIV. If you choose to begin PrEP, you should first be  tested for HIV. You should then be tested every 3 months for as long as you are taking PrEP. Pregnancy  If you are premenopausal and you may become pregnant, ask your health care provider about preconception counseling.  If you may become pregnant, take 400 to 800 micrograms (mcg) of folic acid every day.  If you want to prevent pregnancy, talk to your health care provider about birth control (contraception). Osteoporosis and menopause  Osteoporosis is a disease in which the bones lose minerals and strength with aging. This can result in serious bone fractures. Your risk for osteoporosis can be identified using a bone density scan.  If you are 65 years of age or older, or if you are at risk for osteoporosis and fractures, ask your health care provider if you should be screened.  Ask your health care provider whether you should take a calcium or vitamin D supplement to lower your risk for osteoporosis.  Menopause may have certain physical symptoms and risks.  Hormone replacement therapy may reduce some of these symptoms and risks. Talk to your health care provider about whether hormone replacement therapy is right for you. Follow these instructions at home:  Schedule regular health, dental, and eye exams.  Stay current with your immunizations.  Do not use any tobacco products including cigarettes, chewing tobacco, or electronic cigarettes.  If you are pregnant, do not drink alcohol.  If you are breastfeeding, limit how much and how often you drink alcohol.  Limit alcohol intake to no more  than 1 drink per day for nonpregnant women. One drink equals 12 ounces of beer, 5 ounces of wine, or 1 ounces of hard liquor.  Do not use street drugs.  Do not share needles.  Ask your health care provider for help if you need support or information about quitting drugs.  Tell your health care provider if you often feel depressed.  Tell your health care provider if you have ever been abused or do not feel safe at home. This information is not intended to replace advice given to you by your health care provider. Make sure you discuss any questions you have with your health care provider. Document Released: 07/16/2010 Document Revised: 06/08/2015 Document Reviewed: 10/04/2014 Elsevier Interactive Patient Education  2018 [employer]vier Inc.  

## 2016-12-17 NOTE — Addendum Note (Signed)
Addended by: Elouise Munroe on: 12/17/2016 10:03 AM   Modules accepted: Orders

## 2016-12-18 ENCOUNTER — Encounter: Payer: Self-pay | Admitting: Obstetrics and Gynecology

## 2016-12-18 LAB — FOLLICLE STIMULATING HORMONE: FSH: 38.6 m[IU]/mL

## 2016-12-19 LAB — NUSWAB BV AND CANDIDA, NAA
CANDIDA ALBICANS, NAA: NEGATIVE
CANDIDA GLABRATA, NAA: NEGATIVE

## 2016-12-24 ENCOUNTER — Telehealth: Payer: Self-pay | Admitting: Obstetrics and Gynecology

## 2016-12-24 ENCOUNTER — Other Ambulatory Visit: Payer: Self-pay

## 2016-12-24 MED ORDER — ESTRADIOL 0.5 MG PO TABS: 0.5000 mg | ORAL_TABLET | Freq: Every day | ORAL | 6 refills | Status: DC

## 2016-12-24 NOTE — Telephone Encounter (Signed)
Patient called stating she would like to do a trial of hormones and would also like her results from other labs. Thanks

## 2017-01-01 DIAGNOSIS — D239 Other benign neoplasm of skin, unspecified: Secondary | ICD-10-CM | POA: Diagnosis not present

## 2017-01-01 DIAGNOSIS — L821 Other seborrheic keratosis: Secondary | ICD-10-CM | POA: Diagnosis not present

## 2017-01-01 DIAGNOSIS — D229 Melanocytic nevi, unspecified: Secondary | ICD-10-CM | POA: Diagnosis not present

## 2017-01-01 DIAGNOSIS — L814 Other melanin hyperpigmentation: Secondary | ICD-10-CM | POA: Diagnosis not present

## 2017-01-01 DIAGNOSIS — L905 Scar conditions and fibrosis of skin: Secondary | ICD-10-CM | POA: Diagnosis not present

## 2017-01-01 DIAGNOSIS — D1801 Hemangioma of skin and subcutaneous tissue: Secondary | ICD-10-CM | POA: Diagnosis not present

## 2017-01-02 ENCOUNTER — Telehealth: Payer: 59 | Admitting: Physician Assistant

## 2017-01-02 DIAGNOSIS — R399 Unspecified symptoms and signs involving the genitourinary system: Secondary | ICD-10-CM

## 2017-01-02 MED ORDER — NITROFURANTOIN MONOHYD MACRO 100 MG PO CAPS: 100.0000 mg | ORAL_CAPSULE | Freq: Two times a day (BID) | ORAL | 0 refills | Status: DC

## 2017-01-02 NOTE — Progress Notes (Signed)

## 2017-02-21 ENCOUNTER — Ambulatory Visit
Admission: EM | Admit: 2017-02-21 | Discharge: 2017-02-21 | Disposition: A | Discharge status: Home / Self Care | Payer: No Typology Code available for payment source | Attending: Family Medicine | Admitting: Family Medicine

## 2017-02-21 ENCOUNTER — Ambulatory Visit: Payer: Self-pay | Admitting: Family Medicine

## 2017-02-21 ENCOUNTER — Other Ambulatory Visit: Payer: Self-pay

## 2017-02-21 ENCOUNTER — Encounter: Payer: Self-pay | Admitting: Emergency Medicine

## 2017-02-21 DIAGNOSIS — R0981 Nasal congestion: Secondary | ICD-10-CM | POA: Diagnosis not present

## 2017-02-21 DIAGNOSIS — R509 Fever, unspecified: Secondary | ICD-10-CM

## 2017-02-21 DIAGNOSIS — R69 Illness, unspecified: Secondary | ICD-10-CM

## 2017-02-21 DIAGNOSIS — J111 Influenza due to unidentified influenza virus with other respiratory manifestations: Secondary | ICD-10-CM

## 2017-02-21 DIAGNOSIS — M791 Myalgia, unspecified site: Secondary | ICD-10-CM

## 2017-02-21 MED ORDER — OSELTAMIVIR PHOSPHATE 75 MG PO CAPS: 75.0000 mg | ORAL_CAPSULE | Freq: Two times a day (BID) | ORAL | 0 refills | Status: DC

## 2017-02-21 NOTE — Discharge Instructions (Signed)
Take medication as prescribed. Rest. Drink plenty of fluids.  ° °Follow up with your primary care physician this week as needed. Return to Urgent care for new or worsening concerns.  ° °

## 2017-02-21 NOTE — ED Triage Notes (Signed)
Patient c/o sore throat, HAs, runny nose, and cough that started yesterday.

## 2017-02-21 NOTE — ED Provider Notes (Signed)
[facility] ____________________________________________  Time seen: Approximately 3:07 PM  I have reviewed the triage vital signs and the nursing notes.   HISTORY  Chief Complaint Sore Throat and Headache   HPI Connie Francis is a 47 y.o. female presenting for evaluation of runny nose, nasal congestion, scratchy throat, chills, body aches and fever that started yesterday morning upon awakening.  States her husband also sick with some similar as well as some recent sick contacts at work.  States fever last night was 100.  States cough is mild.  States having sinus congestion and clogged sinus sensation.  Has been taken over-the-counter cough and congestion medications as well as Tylenol and ibuprofen.  States last took Tylenol around 9 AM this morning.  Reports overall continues to eat and drink well.  Denies other aggravating or alleviating factors.  Denies recent sickness. Denies chest pain, shortness of breath, abdominal pain, dysuria, extremity pain, extremity swelling or rash. Denies recent sickness. Denies recent antibiotic use.   Crecencio Mc, MD: PCP   Past Medical History:  Diagnosis Date  . Abdominal bloating   . Anxiety   . 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 CHF (congestive heart failure) ([place]) 2006  . Chronic epigastric pain   . Constipation   . GERD (gastroesophageal reflux disease)   . Hiatal hernia 2017  . IBS (irritable bowel syndrome)   . Kidney stones   . Lump or mass in breast    R-Breast  . Migraine   . Obsessive compulsive disorder   . Thyroid disease    hyper    Patient Active Problem List   Diagnosis Date Noted  . Chronic constipation 12/17/2016  . Endometriosis 12/17/2016  . Recurrent headache 01/06/2016  . Blurred vision, bilateral 01/06/2016  . IBS (irritable bowel syndrome) 07/17/2014  . Status post [facility] 07/15/2014  . Cyst (solitary) of breast 07/12/2013  . Edema 01/18/2013  . Elevated blood pressure reading  with diagnosis of hypertension 01/18/2013  . Psychogenic dyspepsia 11/27/2012  . Visit for preventive health examination 06/18/2012  . Acquired cyst of kidney 02/18/2012  . H/O urinary stone 02/18/2012  . Depression with anxiety 01/07/2012  . Adaptive colitis 12/06/2011  . Obsessive compulsive disorder   . 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 BREAST CYST ASPIRATION Left    negative 2010  . CESAREAN SECTION    . COLONOSCOPY WITH PROPOFOL N/A 07/27/2014   Procedure: COLONOSCOPY WITH PROPOFOL;  Surgeon: Lucilla Lame, MD;  Location: [facility];  Service: Endoscopy;  Laterality: N/A;  . colonscopy  07/27/2015  . DILATION AND CURETTAGE OF UTERUS  3329   complicated by heart failure  . ESOPHAGOGASTRODUODENOSCOPY (EGD) WITH PROPOFOL N/A 07/27/2014   Procedure: ESOPHAGOGASTRODUODENOSCOPY (EGD) WITH PROPOFOL;  Surgeon: Lucilla Lame, MD;  Location: [place] [facility];  Service: Endoscopy;  Laterality: N/A;  . TUBAL LIGATION    . UPPER ENDOSCOPY W/ ESOPHAGEAL MANOMETRY    . VAGINAL HYSTERECTOMY       No current facility-administered medications for this encounter.   Current Outpatient Medications:  .  ALPRAZolam (XANAX) 0.25 MG tablet, Take 0.25 mg by mouth 2 (two) times daily as needed for anxiety., Disp: , Rfl:  .  amLODipine (NORVASC) 2.5 MG tablet, Take 1 tablet (2.5 mg total) by mouth daily., Disp: 30 tablet, Rfl: 0 .  estradiol (ESTRACE) 0.5 MG tablet, Take 1 tablet (0.5 mg total) by mouth daily., Disp: 30 tablet, Rfl: 6 .  linaclotide (LINZESS) 72 MCG capsule, Take by mouth., Disp: , Rfl:  .  oseltamivir (TAMIFLU) 75 MG capsule, Take 1 capsule (75 mg total) by mouth every 12 (twelve) hours., Disp: 10 capsule, Rfl: 0 .  polyethylene glycol (MIRALAX / GLYCOLAX) packet, Take by mouth., Disp: , Rfl:   Allergies Latex; Penicillins; Promethazine; Shellfish allergy; Sulfa antibiotics; and Lamictal [lamotrigine]  Family History   Problem Relation Age of Onset  . Cancer Maternal Aunt        parotid Ca  . Hearing loss Maternal Grandmother   . Alcohol abuse Maternal Grandmother   . Hypertension Mother   . Stroke Paternal Aunt   . Cancer Paternal Uncle        lung, nasopharnygeal  . Arthritis Maternal Grandfather   . Esophageal cancer Cousin 63  . Colon cancer Neg Hx   . Liver disease Neg Hx   . Breast cancer Neg Hx   . Ovarian cancer Neg Hx   . Diabetes Neg Hx     Social History Social History   Tobacco Use  . Smoking status: Never Smoker  . Smokeless tobacco: Never Used  Substance Use Topics  . Alcohol use: Yes    Alcohol/week: 0.6 oz    Types: 1 Glasses of wine per week    Comment: Rare  . Drug use: No    Review of Systems Constitutional: No fever/chills ENT: As above. Cardiovascular: Denies chest pain. Respiratory: Denies shortness of breath. Gastrointestinal: No abdominal pain.  No nausea, no vomiting.  No diarrhea.   Genitourinary: Negative for dysuria. Musculoskeletal: Negative for back pain. Skin: Negative for rash.   ____________________________________________   PHYSICAL EXAM:  VITAL SIGNS: ED Triage Vitals  Enc Vitals Group     BP 02/21/17 1354 (!) 147/84     Pulse Rate 02/21/17 1354 65     Resp 02/21/17 1354 14     Temp 02/21/17 1354 98.2 F (36.8 C)     Temp Source 02/21/17 1354 Oral     SpO2 02/21/17 1354 99 %     Weight 02/21/17 1351 135 lb (61.2 kg)     Height 02/21/17 1351 5\' 2"  (1.575 m)     Head Circumference --      Peak Flow --      Pain Score 02/21/17 1351 3     Pain Loc --      Pain Edu? --      Excl. in [place]? --     Constitutional: Alert and oriented. Well appearing and in no acute distress. Eyes: Conjunctivae are normal.  Head: Atraumatic.Mild tenderness to palpation bilateral frontal and maxillary sinuses. No swelling. No erythema.   Ears: no erythema, normal TMs bilaterally.   Nose: nasal congestion with bilateral nasal turbinate erythema and  edema.   Mouth/Throat: Mucous membranes are moist. Oropharynx non-erythematous. No tonsillar swelling or exudate.  Neck: No stridor.  No cervical spine tenderness to palpation. Hematological/Lymphatic/Immunilogical: No cervical lymphadenopathy. Cardiovascular: Normal rate, regular rhythm. Grossly normal heart sounds.  Good peripheral circulation. Respiratory: Normal respiratory effort.  No retractions.No wheezes, rales or rhonchi. Good air movement.  Musculoskeletal: Steady gait.  Neurologic:  Normal speech and language. No gait instability. Skin:  Skin is warm, dry and intact. No rash noted. Psychiatric: Mood and affect are normal. Speech and behavior are normal.  ___________________________________________   LABS (all labs ordered are listed, but only abnormal results are displayed)  Labs Reviewed - No data to display   PROCEDURES Procedures   INITIAL IMPRESSION /  ASSESSMENT AND PLAN / ED COURSE  Pertinent labs & imaging results that were available during my care of the patient were reviewed by me and considered in my medical decision making (see chart for details).  Well-appearing patient.  No acute distress.  Suspect viral illness such as influenza.  Discussed use of Tamiflu, Rx given.  Encourage rest, fluids, supportive care.  Declines need for cough medication.Discussed indication, risks and benefits of medications with patient.  Discussed follow up with Primary care physician this week. Discussed follow up and return parameters including no resolution or any worsening concerns. Patient verbalized understanding and agreed to plan.   ____________________________________________   FINAL CLINICAL IMPRESSION(S) / ED DIAGNOSES  Final diagnoses:  Influenza-like illness     ED Discharge Orders        Ordered    oseltamivir (TAMIFLU) 75 MG capsule  Every 12 hours     02/21/17 1518       Note: This dictation was prepared with Dragon dictation along with smaller phrase  technology. Any transcriptional errors that result from this process are unintentional.         Marylene Land, NP 02/21/17 1525

## 2017-02-24 ENCOUNTER — Telehealth: Payer: Self-pay

## 2017-02-24 NOTE — Telephone Encounter (Signed)
Called to follow up with patient since visit here at [facility]. Spoke with pt, reports improvement. Patient instructed to call back with any questions or concerns. [facility]

## 2017-02-27 ENCOUNTER — Telehealth: Payer: No Typology Code available for payment source | Admitting: Physician Assistant

## 2017-02-27 DIAGNOSIS — J329 Chronic sinusitis, unspecified: Secondary | ICD-10-CM | POA: Diagnosis not present

## 2017-02-27 DIAGNOSIS — B9789 Other viral agents as the cause of diseases classified elsewhere: Secondary | ICD-10-CM | POA: Diagnosis not present

## 2017-02-27 MED ORDER — FLUTICASONE PROPIONATE 50 MCG/ACT NA SUSP: 2.0000 | Freq: Every day | NASAL | 0 refills | Status: DC

## 2017-02-27 NOTE — Progress Notes (Signed)

## 2017-03-02 ENCOUNTER — Ambulatory Visit (INDEPENDENT_AMBULATORY_CARE_PROVIDER_SITE_OTHER): Payer: Self-pay | Admitting: Family Medicine

## 2017-03-02 ENCOUNTER — Encounter: Payer: Self-pay | Admitting: Family Medicine

## 2017-03-02 VITALS — BP 132/84 | HR 74 | Temp 98.4°F | Resp 18 | Wt 134.8 lb

## 2017-03-02 DIAGNOSIS — J329 Chronic sinusitis, unspecified: Secondary | ICD-10-CM

## 2017-03-02 DIAGNOSIS — R059 Cough, unspecified: Secondary | ICD-10-CM

## 2017-03-02 DIAGNOSIS — R509 Fever, unspecified: Secondary | ICD-10-CM

## 2017-03-02 DIAGNOSIS — R05 Cough: Secondary | ICD-10-CM

## 2017-03-02 MED ORDER — ALBUTEROL SULFATE HFA 108 (90 BASE) MCG/ACT IN AERS: 2.0000 | INHALATION_SPRAY | RESPIRATORY_TRACT | 1 refills | Status: DC | PRN

## 2017-03-02 MED ORDER — AZITHROMYCIN 250 MG PO TABS: ORAL_TABLET | ORAL | 0 refills | Status: DC

## 2017-03-02 NOTE — Progress Notes (Signed)
Subjective:  Connie Francis is a 47 y.o. female who presents for evaluation of URI like symptoms.  Symptoms include achiness, congestion, cough described as nonproductive, facial pain, sinus pressure, mild shortness of breath, and chest tightness with mild back pain. Onset of symptoms was greater than 1 week. She was treated for presumptive influenza over 1 week ago with Tamiflu which she completed 4 days ago. Since completing tamiflu, her cough, chest tightness, and nasal congestion has worsened.  Reports no history of asthma or COPD. She has has pneumonia over 2 years prior.    Treatment to date:   High risk factors for influenza complications:  hx of pneumonia..  The following portions of the patient's history were reviewed and updated as appropriate:  allergies, current medications and past medical history.  Pertinent items are noted in HPI.   Today's Vitals   03/02/17 1110  BP: 132/84  Pulse: 74  Resp: 18  Temp: 98.4 F (36.9 C)  TempSrc: Oral  SpO2: 99%  Weight: 134 lb 12.8 oz (61.1 kg)   Objective:  BP 132/84 (BP Location: Right Arm, Patient Position: Sitting, Cuff Size: Normal)   Pulse 74   Temp 98.4 F (36.9 C) (Oral)   Resp 18   Wt 134 lb 12.8 oz (61.1 kg)   SpO2 99%   BMI 24.66 kg/m   Physical Exam  Constitutional: She is oriented to person, place, and time. She appears well-developed and well-nourished. She appears ill.  HENT:  Nose: Mucosal edema and rhinorrhea present.  Neck: Normal range of motion. Neck supple.  Cardiovascular: Normal rate, regular rhythm, normal heart sounds and intact distal pulses.  Respiratory: Effort normal and breath sounds normal. She has no wheezes.  Rhonchus cough noted on exam   Neurological: She is alert and oriented to person, place, and time.  Skin: Skin is warm.  Psychiatric: She has a normal mood and affect. Her behavior is normal. Judgment and thought content normal.   Assessment:  1. Sinusitis, unspecified chronicity,  unspecified location 2. Fever, unspecified fever cause 3. Cough Plan:  I suspect you may be experiencing a secondary sinus/respiratory infection post influenza. Unable to definitively rule out pneumonia or any other bronchial infection as this office is unable to provide chest imaging or blood work.  I will start you today on Azithromycin and recommend taking benzonatate that you have at home for cough suppression.  You can start the albuterol inhaler, 2 puffs every 4-6 hours as needed for chest tightness or shortness of breath.  Follow-up with PCP on Wednesday if no improvement of symptoms.   Carroll Sage. Kenton Kingfisher, MSN, FNP-C [address] [place]  [place], [place] [address] [phone]

## 2017-03-02 NOTE — Patient Instructions (Addendum)
I suspect you may be experiencing a secondary sinus/respiratory infection post influenza. Unable to definitively rule out pneumonia or any other bronchial infection as this office is unable to provide chest imaging or blood work.  I will start you today on Azithromycin and recommend taking benzonatate that you have at home for cough suppression.  You can start the albuterol inhaler, 2 puffs every 4-6 hours as needed for chest tightness or shortness of breath.  Follow-up with PCP on Wednesday if no improvement of symptoms.     Sinusitis, Adult Sinusitis is soreness and inflammation of your sinuses. Sinuses are hollow spaces in the bones around your face. They are located:  Around your eyes.  In the middle of your forehead.  Behind your nose.  In your cheekbones.  Your sinuses and nasal passages are lined with a stringy fluid (mucus). Mucus normally drains out of your sinuses. When your nasal tissues get inflamed or swollen, the mucus can get trapped or blocked so air cannot flow through your sinuses. This lets bacteria, viruses, and funguses grow, and that leads to infection. Follow these instructions at home: Medicines  Take, use, or apply over-the-counter and prescription medicines only as told by your doctor. These may include nasal sprays.  If you were prescribed an antibiotic medicine, take it as told by your doctor. Do not stop taking the antibiotic even if you start to feel better. Hydrate and Humidify  Drink enough water to keep your pee (urine) clear or pale yellow.  Use a cool mist humidifier to keep the humidity level in your home above 50%.  Breathe in steam for 10-15 minutes, 3-4 times a day or as told by your doctor. You can do this in the bathroom while a hot shower is running.  Try not to spend time in cool or dry air. Rest  Rest as much as possible.  Sleep with your head raised (elevated).  Make sure to get enough sleep each night. General  instructions  Put a warm, moist washcloth on your face 3-4 times a day or as told by your doctor. This will help with discomfort.  Wash your hands often with soap and water. If there is no soap and water, use hand sanitizer.  Do not smoke. Avoid being around people who are smoking (secondhand smoke).  Keep all follow-up visits as told by your doctor. This is important. Contact a doctor if:  You have a fever.  Your symptoms get worse.  Your symptoms do not get better within 10 days. Get help right away if:  You have a very bad headache.  You cannot stop throwing up (vomiting).  You have pain or swelling around your face or eyes.  You have trouble seeing.  You feel confused.  Your neck is stiff.  You have trouble breathing. This information is not intended to replace advice given to you by your health care provider. Make sure you discuss any questions you have with your health care provider. Document Released: 06/19/2007 Document Revised: 08/27/2015 Document Reviewed: 10/26/2014 Elsevier Interactive Patient Education  [employer].

## 2017-03-05 ENCOUNTER — Ambulatory Visit (INDEPENDENT_AMBULATORY_CARE_PROVIDER_SITE_OTHER): Payer: No Typology Code available for payment source | Admitting: Internal Medicine

## 2017-03-05 ENCOUNTER — Encounter: Payer: Self-pay | Admitting: Internal Medicine

## 2017-03-05 DIAGNOSIS — J0111 Acute recurrent frontal sinusitis: Secondary | ICD-10-CM

## 2017-03-05 MED ORDER — PREDNISONE 10 MG PO TABS: ORAL_TABLET | ORAL | 0 refills | Status: DC

## 2017-03-05 MED ORDER — LEVOFLOXACIN 500 MG PO TABS
500.0000 mg | ORAL_TABLET | Freq: Every day | ORAL | 0 refills | Status: DC
Start: 2017-03-05 — End: 2017-09-19

## 2017-03-05 NOTE — Patient Instructions (Signed)
I am adding prednisone taper to help with the inflammation   If the symptoms return after finishing the Z pack,  You can start the levaquin    Get back on some form of Probiotic !!

## 2017-03-05 NOTE — Progress Notes (Signed)
Subjective:  Patient ID: Connie Francis, female    DOB: Jun 02, 1970  Age: 47 y.o. MRN: [id number]  CC: The encounter diagnosis was Acute recurrent frontal sinusitis.  HPI Lilly presents for Er follow up.  Seen in ER on Feb 8 for sinusitis following  influenza in January   Multiple treatments for sinusitis in the past year. :  August  October Feb 8 flu like illness  Prescribed tamiflu : sore throat headache,  achey .  Symptoms progressed with sneezing,  Coughing after finishing tamifly Feb 14  Viral sinusitis no fevers,  Nose spray .  Started  fever 100.8  Feb 17 prescribed z pack  On Sunday    Fever finally And told to follow up with me if no better    Outpatient Medications Prior to Visit  Medication Sig Dispense Refill  . albuterol (PROVENTIL HFA;VENTOLIN HFA) 108 (90 Base) MCG/ACT inhaler Inhale 2 puffs into the lungs every 4 (four) hours as needed for wheezing or shortness of breath (cough, shortness of breath or wheezing.). 1 Inhaler 1  . ALPRAZolam (XANAX) 0.25 MG tablet Take 0.25 mg by mouth 2 (two) times daily as needed for anxiety.    Marland Kitchen amLODipine (NORVASC) 2.5 MG tablet Take 1 tablet (2.5 mg total) by mouth daily. 30 tablet 0  . azithromycin (ZITHROMAX) 250 MG tablet Take 2 tabs PO x 1 dose, then 1 tab PO QD x 4 days 6 tablet 0  . estradiol (ESTRACE) 0.5 MG tablet Take 1 tablet (0.5 mg total) by mouth daily. 30 tablet 6  . fluticasone (FLONASE) 50 MCG/ACT nasal spray Place 2 sprays into both nostrils daily. 16 g 0  . linaclotide (LINZESS) 72 MCG capsule Take by mouth.    . polyethylene glycol (MIRALAX / GLYCOLAX) packet Take by mouth.    . oseltamivir (TAMIFLU) 75 MG capsule Take 1 capsule (75 mg total) by mouth every 12 (twelve) hours. (Patient not taking: Reported on 03/02/2017) 10 capsule 0   No facility-administered medications prior to visit.     Review of Systems;  Patient denies headache, fevers, malaise, unintentional weight loss, skin rash,  eye pain, sinus congestion and sinus pain, sore throat, dysphagia,  hemoptysis , cough, dyspnea, wheezing, chest pain, palpitations, orthopnea, edema, abdominal pain, nausea, melena, diarrhea, constipation, flank pain, dysuria, hematuria, urinary  Frequency, nocturia, numbness, tingling, seizures,  Focal weakness, Loss of consciousness,  Tremor, insomnia, depression, anxiety, and suicidal ideation.      Objective:  BP 136/86 (BP Location: Left Arm, Patient Position: Sitting, Cuff Size: Normal)   Pulse 66   Temp 98.3 F (36.8 C) (Oral)   Resp 16   Ht 5\' 2"  (1.575 m)   Wt 137 lb 9.6 oz (62.4 kg)   SpO2 98%   BMI 25.17 kg/m   BP Readings from Last 3 Encounters:  03/05/17 136/86  03/02/17 132/84  02/21/17 (!) 147/84    Wt Readings from Last 3 Encounters:  03/05/17 137 lb 9.6 oz (62.4 kg)  03/02/17 134 lb 12.8 oz (61.1 kg)  02/21/17 135 lb (61.2 kg)    General appearance: alert, cooperative and appears stated age Ears: normal TM's and external ear canals both ears Throat: lips, mucosa, and tongue normal; teeth and gums normal Neck: no adenopathy, no carotid bruit, supple, symmetrical, trachea midline and thyroid not enlarged, symmetric, no tenderness/mass/nodules Back: symmetric, no curvature. ROM normal. No CVA tenderness. Lungs: clear to auscultation bilaterally Heart: regular rate and rhythm, S1, S2 normal, no murmur,  click, rub or gallop Abdomen: soft, non-tender; bowel sounds normal; no masses,  no organomegaly Pulses: 2+ and symmetric Skin: Skin color, texture, turgor normal. No rashes or lesions Lymph nodes: Cervical, supraclavicular, and axillary nodes normal.  Lab Results  Component Value Date   HGBA1C 4.8 07/20/2015   HGBA1C 5.3 12/05/2014    Lab Results  Component Value Date   CREATININE 0.77 04/23/2016   CREATININE 0.66 07/20/2015   CREATININE 0.6 12/05/2014    Lab Results  Component Value Date   WBC 5.5 04/23/2016   HGB 13.1 04/23/2016   HCT 38.6  04/23/2016   PLT 276.0 04/23/2016   GLUCOSE 77 04/23/2016   CHOL 188 07/20/2015   TRIG 53.0 07/20/2015   HDL 67.50 07/20/2015   LDLCALC 110 (H) 07/20/2015   ALT 12 04/23/2016   AST 15 04/23/2016   NA 140 04/23/2016   K 3.8 04/23/2016   CL 103 04/23/2016   CREATININE 0.77 04/23/2016   BUN 12 04/23/2016   CO2 30 04/23/2016   TSH 0.68 07/20/2015   HGBA1C 4.8 07/20/2015   MICROALBUR 0.8 09/02/2016    No results found.  Assessment & Plan:   Problem List Items Addressed This Visit    Sinusitis, acute frontal    She is still taking antibiotics. Symptoms slow to improve .  Adding prednisone taper.  Advised to add levaquin if symptoms do not resolve.      Relevant Medications   predniSONE (DELTASONE) 10 MG tablet   levofloxacin (LEVAQUIN) 500 MG tablet      I have discontinued Phoenyx T. Lubke's oseltamivir. I am also having her start on predniSONE and levofloxacin. Additionally, I am having her maintain her ALPRAZolam, linaclotide, polyethylene glycol, amLODipine, estradiol, fluticasone, azithromycin, and albuterol.  Meds ordered this encounter  Medications  . predniSONE (DELTASONE) 10 MG tablet    Sig: 6 tablets on Day 1 , then reduce by 1 tablet daily until gone    Dispense:  21 tablet    Refill:  0  . levofloxacin (LEVAQUIN) 500 MG tablet    Sig: Take 1 tablet (500 mg total) by mouth daily.    Dispense:  7 tablet    Refill:  0    Medications Discontinued During This Encounter  Medication Reason  . oseltamivir (TAMIFLU) 75 MG capsule Completed Course    Follow-up: No Follow-up on file.   Crecencio Mc, MD

## 2017-03-06 DIAGNOSIS — J0110 Acute frontal sinusitis, unspecified: Secondary | ICD-10-CM | POA: Insufficient documentation

## 2017-03-06 NOTE — Assessment & Plan Note (Signed)
She is still taking antibiotics. Symptoms slow to improve .  Adding prednisone taper.  Advised to add levaquin if symptoms do not resolve.

## 2017-07-24 ENCOUNTER — Encounter: Payer: Self-pay | Admitting: Medical

## 2017-07-24 ENCOUNTER — Ambulatory Visit (INDEPENDENT_AMBULATORY_CARE_PROVIDER_SITE_OTHER): Payer: Self-pay | Admitting: Medical

## 2017-07-24 VITALS — BP 138/90 | HR 57 | Temp 98.4°F | Wt 134.0 lb

## 2017-07-24 DIAGNOSIS — J029 Acute pharyngitis, unspecified: Secondary | ICD-10-CM

## 2017-07-24 LAB — POCT RAPID STREP A (OFFICE): RAPID STREP A SCREEN: NEGATIVE

## 2017-07-24 NOTE — Patient Instructions (Addendum)
Dilute salt water gargles  3-4 times a day. OTC Zyrtec or Claritin or Allegra daily.  Follow up in 3-5 days if not improving. Soft foods and avoid acidic foods like tomatoes or orange juice.    Pharyngitis Pharyngitis is a sore throat (pharynx). There is redness, pain, and swelling of your throat. Follow these instructions at home:  Drink enough fluids to keep your pee (urine) clear or pale yellow.  Only take medicine as told by your doctor. ? You may get sick again if you do not take medicine as told. Finish your medicines, even if you start to feel better. ? Do not take aspirin.  Rest.  Rinse your mouth (gargle) with salt water ( tsp of salt per 1 qt of water) every 1-2 hours. This will help the pain.  If you are not at risk for choking, you can suck on hard candy or sore throat lozenges. Contact a doctor if:  You have large, tender lumps on your neck.  You have a rash.  You cough up green, yellow-brown, or bloody spit. Get help right away if:  You have a stiff neck.  You drool or cannot swallow liquids.  You throw up (vomit) or are not able to keep medicine or liquids down.  You have very bad pain that does not go away with medicine.  You have problems breathing (not from a stuffy nose). This information is not intended to replace advice given to you by your health care provider. Make sure you discuss any questions you have with your health care provider. Document Released: 06/19/2007 Document Revised: 06/08/2015 Document Reviewed: 09/07/2012 Elsevier Interactive Patient Education  2017 [employer].

## 2017-07-24 NOTE — Progress Notes (Signed)
   Subjective:    Patient ID: Connie Francis, female    DOB: [birth year], 47 y.o.   MRN: [id number]  HPI 47 yo female in non acute distress. Complains of sore throat x 2  days. 32 yo Son with strep throat diagnosed one week ago." I want to know if I have strep throat."  Headache "but I have a history of headaches" since Monday, behind eyes and back of head. Denies fever or chills, but states she is sweating due to the "change of life".Taking ibuprofen every 6-8 hours as needed for pain. Does help but then it returns. Using Flonase, which she got during last sinus infection , thought it might help.   Review of Systems  Constitutional: Positive for diaphoresis (night sweats due to menopause). Negative for chills and fever.  HENT: Positive for congestion, ear pain (pressure both ears), postnasal drip ("little bit"), rhinorrhea, sinus pressure (under eyes), sinus pain, sneezing, sore throat and trouble swallowing. Negative for ear discharge.   Eyes: Negative for discharge, itching and visual disturbance.  Respiratory: Positive for cough ("mild" non productive). Negative for shortness of breath.   Cardiovascular: Negative for chest pain, palpitations and leg swelling.  Gastrointestinal: Positive for nausea. Negative for abdominal pain.  Endocrine: Negative for polydipsia and polyphagia.  Genitourinary: Negative for dysuria.  Musculoskeletal: Negative for myalgias.  Skin: Negative for rash.  Allergic/Immunologic: Negative for environmental allergies.  Neurological: Positive for headaches ("behind my eyes and the back of my head").  Hematological: Negative for adenopathy.  Psychiatric/Behavioral: Negative for behavioral problems, confusion, self-injury and suicidal ideas.       Objective:   Physical Exam  Constitutional: She is oriented to person, place, and time. She appears well-developed and well-nourished.  HENT:  Head: Normocephalic and atraumatic.  Right Ear: Hearing, external ear  and ear canal normal. A middle ear effusion is present.  Left Ear: Hearing, external ear and ear canal normal. A middle ear effusion is present.  Nose: Nose normal.  Mouth/Throat: Uvula is midline, oropharynx is clear and moist and mucous membranes are normal. No uvula swelling. No oropharyngeal exudate, posterior oropharyngeal edema or posterior oropharyngeal erythema. Tonsils are 1+ on the right. Tonsils are 1+ on the left.  Eyes: Pupils are equal, round, and reactive to light. Conjunctivae and EOM are normal.  Neck: Normal range of motion. Neck supple.  Cardiovascular: Normal rate, regular rhythm and normal heart sounds. Exam reveals no gallop and no friction rub.  No murmur heard. Pulmonary/Chest: Effort normal and breath sounds normal.  Lymphadenopathy:    She has no cervical adenopathy.  Neurological: She is alert and oriented to person, place, and time.  Skin: Skin is warm and dry.  Psychiatric: She has a normal mood and affect. Her behavior is normal. Judgment and thought content normal.  Nursing note and vitals reviewed.  Cobblestoning back of tongue noted. No cough noted in room.   Strep test  negative Assessment & Plan:  Pharyngitis, PND  Taking ibuprofen every 6-8 hours as needed for pain. OTC Claritin or Zyrtec or Allegra take as directed (daily). Continue Flonase as directed. Dilute salt water gargles  3-4 times a day.  Return in  3-5 days if not improving. Patient verbalizes understanding and has no questions at discharge.

## 2017-07-28 ENCOUNTER — Telehealth: Payer: Self-pay | Admitting: Emergency Medicine

## 2017-07-28 NOTE — Telephone Encounter (Signed)
Left message follow up call from [facility] visit 

## 2017-08-22 ENCOUNTER — Other Ambulatory Visit: Payer: Self-pay | Admitting: Internal Medicine

## 2017-08-22 DIAGNOSIS — Z1231 Encounter for screening mammogram for malignant neoplasm of breast: Secondary | ICD-10-CM

## 2017-09-19 ENCOUNTER — Telehealth: Payer: Self-pay

## 2017-09-19 ENCOUNTER — Encounter: Payer: Self-pay | Admitting: Family

## 2017-09-19 ENCOUNTER — Ambulatory Visit (INDEPENDENT_AMBULATORY_CARE_PROVIDER_SITE_OTHER): Payer: No Typology Code available for payment source | Admitting: Family

## 2017-09-19 VITALS — BP 132/78 | HR 73 | Temp 98.1°F | Resp 15 | Wt 137.4 lb

## 2017-09-19 DIAGNOSIS — I10 Essential (primary) hypertension: Secondary | ICD-10-CM

## 2017-09-19 DIAGNOSIS — J0111 Acute recurrent frontal sinusitis: Secondary | ICD-10-CM

## 2017-09-19 MED ORDER — DOXYCYCLINE HYCLATE 100 MG PO TBEC: 100.0000 mg | DELAYED_RELEASE_TABLET | Freq: Two times a day (BID) | ORAL | 0 refills | Status: DC

## 2017-09-19 NOTE — Assessment & Plan Note (Signed)
Reassured by normal neurologic and HEENT exam.  Headache is similar to headaches in the past.  Advised patient to start Mucinex; if no improvement in 1 to 2 days, patient understands to start antibiotic, doxycycline.  She will let us know if she is not better

## 2017-09-19 NOTE — Progress Notes (Signed)
Subjective:    Patient ID: Connie Francis, female    DOB: June 27, 1970, 47 y.o.   MRN: [id number]  CC: Connie Francis is a 47 y.o. female who presents today for an acute visit.    HPI: CC: sinus pressure   HA x 6 days, unchanged.  H/o migraines. Thinks sinus , weather, seasonality will trigger HA. HA started on right side and now on left side. Feel it 'teeth ,face, jaws.' Has tried flonase, Claritin, advil, with some temporary.  No vision change, numbness. HA feels like HA in the past. Rates pain today 5/10. Not worse HA of life.  Describes 'little' non productive cough, thick congestion.   HTN- not taking amlodipine     HISTORY:  Past Medical History:  Diagnosis Date  . Abdominal bloating   . Anxiety   . 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 CHF (congestive heart failure) ([facility]) 2006  . Chronic epigastric pain   . Constipation   . GERD (gastroesophageal reflux disease)   . Hiatal hernia 2017  . IBS (irritable bowel syndrome)   . Kidney stones   . Lump or mass in breast    R-Breast  . Migraine   . Obsessive compulsive disorder   . Thyroid disease    hyper   Past Surgical History:  Procedure Laterality Date  . BREAST CYST ASPIRATION Left    negative 2010  . CESAREAN SECTION    . COLONOSCOPY WITH PROPOFOL N/A 07/27/2014   Procedure: COLONOSCOPY WITH PROPOFOL;  Surgeon: Lucilla Lame, MD;  Location: [facility];  Service: Endoscopy;  Laterality: N/A;  . colonscopy  07/27/2015  . DILATION AND CURETTAGE OF UTERUS  4034   complicated by heart failure  . ESOPHAGOGASTRODUODENOSCOPY (EGD) WITH PROPOFOL N/A 07/27/2014   Procedure: ESOPHAGOGASTRODUODENOSCOPY (EGD) WITH PROPOFOL;  Surgeon: Lucilla Lame, MD;  Location: [facility];  Service: Endoscopy;  Laterality: N/A;  . TUBAL LIGATION    . UPPER ENDOSCOPY W/ ESOPHAGEAL MANOMETRY    . VAGINAL HYSTERECTOMY     Family History  Problem Relation Age of Onset  . Cancer Maternal Aunt        parotid  Ca  . Hearing loss Maternal Grandmother   . Alcohol abuse Maternal Grandmother   . Hypertension Mother   . Stroke Paternal Aunt   . Cancer Paternal Uncle        lung, nasopharnygeal  . Arthritis Maternal Grandfather   . Esophageal cancer Cousin 6  . Colon cancer Neg Hx   . Liver disease Neg Hx   . Breast cancer Neg Hx   . Ovarian cancer Neg Hx   . Diabetes Neg Hx     Allergies: Latex; Penicillins; Promethazine; Shellfish allergy; Sulfa antibiotics; and Lamictal [lamotrigine] Current Outpatient Medications on File Prior to Visit  Medication Sig Dispense Refill  . ALPRAZolam (XANAX) 0.25 MG tablet Take 0.25 mg by mouth 2 (two) times daily as needed for anxiety.    . fluticasone (FLONASE) 50 MCG/ACT nasal spray Place 2 sprays into both nostrils daily. 16 g 0  . polyethylene glycol (MIRALAX / GLYCOLAX) packet Take by mouth.    Marland Kitchen amLODipine (NORVASC) 2.5 MG tablet Take 1 tablet (2.5 mg total) by mouth daily. (Patient not taking: Reported on 07/24/2017) 30 tablet 0   No current facility-administered medications on file prior to visit.     Social History   Tobacco Use  . Smoking status: Never Smoker  . Smokeless tobacco: Never Used  Substance Use Topics  .  Alcohol use: Yes    Alcohol/week: 1.0 standard drinks    Types: 1 Glasses of wine per week    Comment: Rare  . Drug use: No    Review of Systems  Constitutional: Negative for chills and fever.  HENT: Positive for congestion. Negative for sore throat.   Respiratory: Positive for cough. Negative for shortness of breath and wheezing.   Cardiovascular: Negative for chest pain and palpitations.  Gastrointestinal: Negative for nausea and vomiting.  Neurological: Positive for headaches.      Objective:    BP 132/78   Pulse 73   Temp 98.1 F (36.7 C) (Oral)   Resp 15   Wt 137 lb 6 oz (62.3 kg)   SpO2 98%   BMI 25.13 kg/m    Physical Exam  Constitutional: Vital signs are normal. She appears well-developed and  well-nourished.  HENT:  Head: Normocephalic and atraumatic.  Right Ear: Hearing, tympanic membrane, external ear and ear canal normal. No swelling or tenderness. Tympanic membrane is not erythematous and not bulging. No middle ear effusion.  Left Ear: Hearing, tympanic membrane, external ear and ear canal normal. No swelling or tenderness. Tympanic membrane is not erythematous and not bulging.  No middle ear effusion.  Nose: No rhinorrhea. Right sinus exhibits no maxillary sinus tenderness and no frontal sinus tenderness. Left sinus exhibits no maxillary sinus tenderness and no frontal sinus tenderness.  Mouth/Throat: Uvula is midline, oropharynx is clear and moist and mucous membranes are normal. No uvula swelling. No posterior oropharyngeal edema or posterior oropharyngeal erythema.  Eyes: Pupils are equal, round, and reactive to light. Conjunctivae, EOM and lids are normal. Lids are everted and swept, no foreign bodies found.  Cardiovascular: Normal rate, regular rhythm, normal heart sounds and normal pulses.  Pulmonary/Chest: Effort normal and breath sounds normal. She has no wheezes. She has no rhonchi. She has no rales.  Lymphadenopathy:       Head (right side): No submental, no submandibular, no tonsillar, no preauricular, no posterior auricular and no occipital adenopathy present.       Head (left side): No submental, no submandibular, no tonsillar, no preauricular, no posterior auricular and no occipital adenopathy present.    She has no cervical adenopathy.       Right cervical: No superficial cervical, no deep cervical and no posterior cervical adenopathy present.      Left cervical: No superficial cervical, no deep cervical and no posterior cervical adenopathy present.  Neurological: She is alert. She has normal strength. No cranial nerve deficit or sensory deficit. She displays a negative Romberg sign.  Reflex Scores:      Bicep reflexes are 2+ on the right side and 2+ on the left  side.      Patellar reflexes are 2+ on the right side and 2+ on the left side. Grip equal and strong bilateral upper extremities. Gait strong and steady. Able to perform finger-to-nose without difficulty.    Skin: Skin is warm and dry.  Psychiatric: She has a normal mood and affect. Her speech is normal and behavior is normal. Thought content normal.  Vitals reviewed.      Assessment & Plan:      I have discontinued Rashelle T. Feldt's linaclotide, estradiol, azithromycin, albuterol, predniSONE, and levofloxacin. I am also having her start on doxycycline. Additionally, I am having her maintain her ALPRAZolam, polyethylene glycol, amLODipine, and fluticasone.   Meds ordered this encounter  Medications  . doxycycline (DORYX) 100 MG EC tablet  Sig: Take 1 tablet (100 mg total) by mouth 2 (two) times daily.    Dispense:  20 tablet    Refill:  0    Order Specific Question:   Supervising Provider    Answer:   Crecencio Mc [[id number]]    Return precautions given.   Risks, benefits, and alternatives of the medications and treatment plan prescribed today were discussed, and patient expressed understanding.   Education regarding symptom management and diagnosis given to patient on AVS.  Continue to follow with Crecencio Mc, MD for routine health maintenance.   [place] and I agreed with plan.   Mable Paris, FNP

## 2017-09-19 NOTE — Telephone Encounter (Signed)
Sorry- I meant to send in regular doxycyline   Please call them

## 2017-09-19 NOTE — Telephone Encounter (Signed)
Copied from [place] [id number]. Topic: Quick Communication - See Telephone Encounter >> Sep 19, 2017  9:27 AM Antonieta Iba C wrote: CRM for notification. See Telephone encounter for: 09/19/17.  Pharmacy called in to receive clarity on Rx for doxycycline (DORYX) 100 MG EC tablet, pharmacy would like to verify if PCP intended to send in the Doryx or just the regular Doxycycline?   Please advise.   Caryl Pina [phone]

## 2017-09-19 NOTE — Assessment & Plan Note (Signed)
Improved as patient rested in room.  Advised her to follow-up with PCP as patient may benefit from starting amlodipine again.  She verbalized understanding of this.

## 2017-09-19 NOTE — Telephone Encounter (Signed)
Spoke with Pam at [facility] advised that script per Joycelyn Schmid should have been sent in for Doxycyline capsules

## 2017-09-19 NOTE — Patient Instructions (Signed)
Please make follow with with Dr Derrel Nip regarding prevention of HA  Start mucinex, plently of water  I suspect that your infection is viral in nature.  As discussed, I advise that you wait to fill the antibiotic after 1-2 days of symptom management to see if your symptoms improve. If you do not show improvement, you may take the antibiotic as prescribed. Please avoid sun on doxcycline as discussed.   Monitor blood pressure,  Goal is less than 130/80; if persistently higher, please make sooner follow up appointment so we can recheck you blood pressure and manage medications  Let us know if not better

## 2017-09-26 ENCOUNTER — Ambulatory Visit
Admission: RE | Admit: 2017-09-26 | Discharge: 2017-09-26 | Disposition: A | Discharge status: Home / Self Care | Payer: No Typology Code available for payment source | Source: Ambulatory Visit | Attending: Internal Medicine | Admitting: Internal Medicine

## 2017-09-26 DIAGNOSIS — Z1231 Encounter for screening mammogram for malignant neoplasm of breast: Secondary | ICD-10-CM | POA: Diagnosis not present

## 2017-10-03 ENCOUNTER — Encounter: Payer: Self-pay | Admitting: Internal Medicine

## 2017-10-03 ENCOUNTER — Ambulatory Visit (INDEPENDENT_AMBULATORY_CARE_PROVIDER_SITE_OTHER): Payer: No Typology Code available for payment source | Admitting: Internal Medicine

## 2017-10-03 VITALS — BP 132/88 | HR 71 | Temp 98.8°F | Resp 15 | Ht 62.0 in | Wt 137.6 lb

## 2017-10-03 DIAGNOSIS — Z0000 Encounter for general adult medical examination without abnormal findings: Secondary | ICD-10-CM

## 2017-10-03 DIAGNOSIS — I10 Essential (primary) hypertension: Secondary | ICD-10-CM

## 2017-10-03 DIAGNOSIS — R109 Unspecified abdominal pain: Secondary | ICD-10-CM

## 2017-10-03 DIAGNOSIS — M546 Pain in thoracic spine: Secondary | ICD-10-CM

## 2017-10-03 DIAGNOSIS — Z23 Encounter for immunization: Secondary | ICD-10-CM

## 2017-10-03 DIAGNOSIS — N281 Cyst of kidney, acquired: Secondary | ICD-10-CM

## 2017-10-03 DIAGNOSIS — E785 Hyperlipidemia, unspecified: Secondary | ICD-10-CM

## 2017-10-03 DIAGNOSIS — R3129 Other microscopic hematuria: Secondary | ICD-10-CM

## 2017-10-03 LAB — POCT URINALYSIS DIPSTICK
BILIRUBIN UA: NEGATIVE
Glucose, UA: NEGATIVE
Ketones, UA: NEGATIVE
LEUKOCYTES UA: NEGATIVE
Nitrite, UA: NEGATIVE
PH UA: 7 (ref 5.0–8.0)
Protein, UA: NEGATIVE
SPEC GRAV UA: 1.01 (ref 1.010–1.025)
UROBILINOGEN UA: 0.2 U/dL

## 2017-10-03 LAB — COMPREHENSIVE METABOLIC PANEL
ALBUMIN: 4.4 g/dL (ref 3.5–5.2)
ALT: 13 U/L (ref 0–35)
AST: 18 U/L (ref 0–37)
Alkaline Phosphatase: 86 U/L (ref 39–117)
BUN: 10 mg/dL (ref 6–23)
CALCIUM: 9.4 mg/dL (ref 8.4–10.5)
CHLORIDE: 104 meq/L (ref 96–112)
CO2: 29 mEq/L (ref 19–32)
CREATININE: 0.7 mg/dL (ref 0.40–1.20)
GFR: 96.11 mL/min (ref >60.00)
Glucose, Bld: 85 mg/dL (ref 70–99)
Potassium: 4 mEq/L (ref 3.5–5.1)
SODIUM: 141 meq/L (ref 135–145)
TOTAL PROTEIN: 7.4 g/dL (ref 6.0–8.3)
Total Bilirubin: 0.4 mg/dL (ref 0.2–1.2)

## 2017-10-03 LAB — LIPID PANEL
CHOLESTEROL: 191 mg/dL (ref 0–200)
HDL: 53.9 mg/dL (ref >39.00)
LDL Cholesterol: 123 mg/dL — ABNORMAL HIGH (ref 0–99)
NONHDL: 137.29
Total CHOL/HDL Ratio: 4
Triglycerides: 71 mg/dL (ref 0.0–149.0)
VLDL: 14.2 mg/dL (ref 0.0–40.0)

## 2017-10-03 LAB — MICROALBUMIN / CREATININE URINE RATIO
CREATININE, U: 41.5 mg/dL
Microalb Creat Ratio: 1.7 mg/g (ref 0.0–30.0)

## 2017-10-03 LAB — URINALYSIS, MICROSCOPIC ONLY

## 2017-10-03 NOTE — Progress Notes (Signed)
Patient ID: Connie Francis, female    DOB: Aug 23, 1970  Age: 47 y.o. MRN: [id number]  The patient is here for  Follow up and management of other chronic and acute problems.   S/p total  hysterectomy. Pelvic and breast exams annually by DeFrancesco (preventive visit Dec 2018)   colonoscopy 2016:  IBS  mammo last week ,  Results normal  Derm once a year with Moye at [facility] exam with woodard annually,  Wears  readers for use with computer work    The risk factors are reflected in the social history.  The roster of all physicians providing medical care to patient - is listed in the Snapshot section of the chart.  Activities of daily living:  The patient is 100% independent in all ADLs: dressing, toileting, feeding as well as independent mobility  Home safety : The patient has smoke detectors in the home. They wear seatbelts.  There are no firearms at home. There is no violence in the home.   There is no risks for hepatitis, STDs or HIV. There is no   history of blood transfusion. They have no travel history to infectious disease endemic areas of the world.  The patient has seen their dentist in the last six month. They have seen their eye doctor in the last year. They deny  hearing difficulty with regard to whispered voices and some television programs.  They have deferred audiologic testing in the last year.  They do not  have excessive sun exposure. Discussed the need for sun protection: hats, long sleeves and use of sunscreen if there is significant sun exposure.   Diet: the importance of a healthy diet is discussed. They do have a healthy diet.  The benefits of regular aerobic exercise were discussed. She is not exercising regularly    Depression screen: there are no signs or vegative symptoms of depression- irritability, change in appetite, anhedonia, sadness/tearfullness.  Her GAD is treated by Dr Nicolasa Ducking   The following portions of the patient's history were reviewed and  updated as appropriate: allergies, current medications, past family history, past medical history,  past surgical history, past social history  and problem list.  Visual acuity was not assessed per patient preference since she has regular follow up with her ophthalmologist. Hearing and body mass index were assessed and reviewed.   During the course of the visit the patient was educated and counseled about appropriate screening and preventive services including : fall prevention , diabetes screening, nutrition counseling, colorectal cancer screening, and recommended immunizations.    CC: The primary encounter diagnosis was Essential hypertension. Diagnoses of Hyperlipidemia LDL goal <130, Acute right flank pain, Need for prophylactic vaccination with measles-mumps-rubella (MMR) vaccine, Acute right-sided thoracic back pain, Microscopic hematuria, Acquired cyst of kidney, and Visit for preventive health examination were also pertinent to this visit.  Recurrent sinusitis treated sept 6 with mucinex.  Did not take the  prn  doxycyc by margaret   temp of 100.8 after getting the influenza vaccine on Wednesday   . Having right CVA pain for the past week  ,  Had some UTI sympotms the days prior to onset of CVA pain nagging pain,  No t severe,  Has not seen blood in urine.   GAD:  Works as a Orthoptist for [employer].  Works from home 3 days per week,  2 days per week in the [employer].  Stressed out.  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   She has a past surgical history that includes Dilation and curettage of uterus (2006); Cesarean  section; Upper endoscopy w/ esophageal manometry; Tubal ligation; Esophagogastroduodenoscopy (egd) with propofol (N/A, 07/27/2014); Colonoscopy with propofol (N/A, 07/27/2014); colonscopy (07/27/2015); Vaginal hysterectomy; and Breast cyst aspiration (Left).   Her family history includes Alcohol abuse in her maternal grandmother; Arthritis in her maternal grandfather; Cancer in her maternal aunt and paternal uncle; Esophageal cancer (age of onset: 40) in her cousin; Hearing loss in her maternal grandmother; Hypertension in her mother; Stroke in her paternal aunt.She reports that she has never smoked. She has never used smokeless tobacco. She reports that she drinks about 1.0 standard drinks of alcohol per week. She reports that she does not use drugs.  Outpatient Medications Prior to Visit  Medication Sig Dispense Refill  . ALPRAZolam (XANAX) 0.25 MG tablet Take 0.25 mg by mouth 2 (two) times daily as needed for anxiety.    . polyethylene glycol (MIRALAX / GLYCOLAX) packet Take by mouth.    Marland Kitchen amLODipine (NORVASC) 2.5 MG tablet Take 1 tablet (2.5 mg total) by mouth daily. (Patient not taking: Reported on 07/24/2017) 30 tablet 0  . doxycycline (DORYX) 100 MG EC tablet Take 1 tablet (100 mg total) by mouth 2 (two) times daily. (Patient not taking: Reported on 10/03/2017) 20 tablet 0  . fluticasone (FLONASE) 50 MCG/ACT nasal spray Place 2 sprays into both nostrils daily. (Patient not taking: Reported on 10/03/2017) 16 g 0   No facility-administered medications prior to visit.     Review of Systems   Patient denies headache, fevers, malaise, unintentional weight loss, skin rash, eye pain, sinus congestion and sinus pain, sore throat, dysphagia,  hemoptysis , cough, dyspnea, wheezing, chest pain, palpitations, orthopnea, edema, abdominal pain, nausea, melena, diarrhea, constipation, flank pain, dysuria, hematuria, urinary  Frequency, nocturia, numbness, tingling, seizures,  Focal weakness, Loss of  consciousness,  Tremor, insomnia, depression, anxiety, and suicidal ideation.      Objective:  BP 132/88 (BP Location: Left Arm, Patient Position: Sitting, Cuff Size: Normal)   Pulse 71   Temp 98.8 F (37.1 C) (Oral)   Resp 15   Ht _0  (1.575 m)   Wt 137 lb 9.6 oz (62.4 kg)   SpO2 97%   BMI 25.17 kg/m   Physical Exam   General appearance: alert, cooperative and appears stated age Ears: normal TM's and external ear canals both ears Throat: lips, mucosa, and tongue normal; teeth and gums normal Neck: no adenopathy, no carotid bruit, supple, symmetrical, trachea midline and thyroid not enlarged, symmetric, no tenderness/mass/nodules Back: symmetric, no curvature. ROM normal. No CVA tenderness. Lungs: clear to auscultation bilaterally Heart: regular rate and rhythm, S1, S2 normal, no murmur, click, rub or gallop Abdomen: soft, non-tender; bowel sounds normal; no masses,  no organomegaly Pulses: 2+ and symmetric Skin: Skin color, texture, turgor normal. No rashes or lesions Lymph nodes: Cervical, supraclavicular, and axillary nodes normal.   Assessment & Plan:   Problem List Items Addressed This Visit    Acquired cyst of kidney    previously seen cyst was not present on April 2018 CT       Essential hypertension - Primary    Previously managed with low dose  amlodipine . Home readings have been periodically  checked and are < 130/80.  encouraged to start exercising       Relevant Orders   Microalbumin / creatinine urine ratio (Completed)   Microscopic hematuria    Etiology unclear.  She has been having right sided back pain  Mild,nagging for the past week.  Will repeat UA in 2 weeks and if still present will need CT repeated and urology follow up       Right-sided back pain    Mild , present for the past week,  With dysuria reported as a symptom one day prior to the onset of the pain.   UA and culture negative for infection but she has microscopic hematuria.  (3 RBCs per  field) .  History of left renal cyst,  Collapsed by last CT April 2018.   No history of stones.       Visit for preventive health examination    Annual comprehensive preventive exam was done as well as an evaluation and management of chronic conditions .  During the course of the visit the patient was educated and counseled about appropriate screening and preventive services including :  diabetes screening, lipid analysis with projected  10 year  risk for CAD , nutrition counseling, breast, cervical and colorectal cancer screening, and recommended immunizations.  Printed recommendations for health maintenance screenings was given       Other Visit Diagnoses    Hyperlipidemia LDL goal <130       Relevant Orders   Lipid panel (Completed)   Acute right flank pain       Relevant Orders   POCT urinalysis dipstick (Completed)   Urine Microscopic Only (Completed)   Urine Culture (Completed)   Comprehensive metabolic panel (Completed)   Need for prophylactic vaccination with measles-mumps-rubella (MMR) vaccine       Relevant Orders   Measles/Mumps/Rubella Immunity    A total of 40 minutes was spent with patient more than half of which was spent in counseling patient on the above mentioned issues , reviewing and explaining recent labs and imaging studies done, and coordination of care.   I have discontinued Lailie T. Chock's fluticasone and doxycycline. I am also having her maintain her ALPRAZolam, polyethylene glycol, and amLODipine.  No orders of the defined types were placed in this encounter.   Medications Discontinued During This Encounter  Medication Reason  . doxycycline (DORYX) 100 MG EC tablet Prescription never filled  . fluticasone (FLONASE) 50 MCG/ACT nasal spray     Follow-up: No follow-ups on file.   Crecencio Mc, MD

## 2017-10-03 NOTE — Patient Instructions (Addendum)
I want you to start exercising!!   Walk for 30 minutes on the days you work from home    Use Reresh or systane eye drops every 2 hours when working on the computer to treat dry eye    Labs and urine test today   Health Maintenance, Female Adopting a healthy lifestyle and getting preventive care can go a long way to promote health and wellness. Talk with your health care provider about what schedule of regular examinations is right for you. This is a good chance for you to check in with your provider about disease prevention and staying healthy. In between checkups, there are plenty of things you can do on your own. Experts have done a lot of research about which lifestyle changes and preventive measures are most likely to keep you healthy. Ask your health care provider for more information. Weight and diet Eat a healthy diet  Be sure to include plenty of vegetables, fruits, low-fat dairy products, and lean protein.  Do not eat a lot of foods high in solid fats, added sugars, or salt.  Get regular exercise. This is one of the most important things you can do for your health. ? Most adults should exercise for at least 150 minutes each week. The exercise should increase your heart rate and make you sweat (moderate-intensity exercise). ? Most adults should also do strengthening exercises at least twice a week. This is in addition to the moderate-intensity exercise.  Maintain a healthy weight  Body mass index (BMI) is a measurement that can be used to identify possible weight problems. It estimates body fat based on height and weight. Your health care provider can help determine your BMI and help you achieve or maintain a healthy weight.  For females 20 years of age and older: ? A BMI below 18.5 is considered underweight. ? A BMI of 18.5 to 24.9 is normal. ? A BMI of 25 to 29.9 is considered overweight. ? A BMI of 30 and above is considered obese.  Watch levels of cholesterol and blood  lipids  You should start having your blood tested for lipids and cholesterol at 45 years of age, then have this test every 5 years.  You may need to have your cholesterol levels checked more often if: ? Your lipid or cholesterol levels are high. ? You are older than 45 years of age. ? You are at high risk for heart disease.  Cancer screening Lung Cancer  Lung cancer screening is recommended for adults 77-86 years old who are at high risk for lung cancer because of a history of smoking.  A yearly low-dose CT scan of the lungs is recommended for people who: ? Currently smoke. ? Have quit within the past 15 years. ? Have at least a 30-pack-year history of smoking. A pack year is smoking an average of one pack of cigarettes a day for 1 year.  Yearly screening should continue until it has been 15 years since you quit.  Yearly screening should stop if you develop a health problem that would prevent you from having lung cancer treatment.  Breast Cancer  Practice breast self-awareness. This means understanding how your breasts normally appear and feel.  It also means doing regular breast self-exams. Let your health care provider know about any changes, no matter how small.  If you are in your 20s or 30s, you should have a clinical breast exam (CBE) by a health care provider every 1-3 years as part of a  regular health exam.  If you are 40 or older, have a CBE every year. Also consider having a breast X-ray (mammogram) every year.  If you have a family history of breast cancer, talk to your health care provider about genetic screening.  If you are at high risk for breast cancer, talk to your health care provider about having an MRI and a mammogram every year.  Breast cancer gene (BRCA) assessment is recommended for women who have family members with BRCA-related cancers. BRCA-related cancers include: ? Breast. ? Ovarian. ? Tubal. ? Peritoneal cancers.  Results of the assessment will  determine the need for genetic counseling and BRCA1 and BRCA2 testing.  Cervical Cancer Your health care provider may recommend that you be screened regularly for cancer of the pelvic organs (ovaries, uterus, and vagina). This screening involves a pelvic examination, including checking for microscopic changes to the surface of your cervix (Pap test). You may be encouraged to have this screening done every 3 years, beginning at age 61.  For women ages 32-65, health care providers may recommend pelvic exams and Pap testing every 3 years, or they may recommend the Pap and pelvic exam, combined with testing for human papilloma virus (HPV), every 5 years. Some types of HPV increase your risk of cervical cancer. Testing for HPV may also be done on women of any age with unclear Pap test results.  Other health care providers may not recommend any screening for nonpregnant women who are considered low risk for pelvic cancer and who do not have symptoms. Ask your health care provider if a screening pelvic exam is right for you.  If you have had past treatment for cervical cancer or a condition that could lead to cancer, you need Pap tests and screening for cancer for at least 20 years after your treatment. If Pap tests have been discontinued, your risk factors (such as having a new sexual partner) need to be reassessed to determine if screening should resume. Some women have medical problems that increase the chance of getting cervical cancer. In these cases, your health care provider may recommend more frequent screening and Pap tests.  Colorectal Cancer  This type of cancer can be detected and often prevented.  Routine colorectal cancer screening usually begins at 45 years of age and continues through 45 years of age.  Your health care provider may recommend screening at an earlier age if you have risk factors for colon cancer.  Your health care provider may also recommend using home test kits to check  for hidden blood in the stool.  A small camera at the end of a tube can be used to examine your colon directly (sigmoidoscopy or colonoscopy). This is done to check for the earliest forms of colorectal cancer.  Routine screening usually begins at age 21.  Direct examination of the colon should be repeated every 5-10 years through 45 years of age. However, you may need to be screened more often if early forms of precancerous polyps or small growths are found.  Skin Cancer  Check your skin from head to toe regularly.  Tell your health care provider about any new moles or changes in moles, especially if there is a change in a mole's shape or color.  Also tell your health care provider if you have a mole that is larger than the size of a pencil eraser.  Always use sunscreen. Apply sunscreen liberally and repeatedly throughout the day.  Protect yourself by wearing long sleeves, pants,  a wide-brimmed hat, and sunglasses whenever you are outside.  Heart disease, diabetes, and high blood pressure  High blood pressure causes heart disease and increases the risk of stroke. High blood pressure is more likely to develop in: ? People who have blood pressure in the high end of the normal range (130-139/85-89 mm Hg). ? People who are overweight or obese. ? People who are African American.  If you are 35-32 years of age, have your blood pressure checked every 3-5 years. If you are 1 years of age or older, have your blood pressure checked every year. You should have your blood pressure measured twice-once when you are at a hospital or clinic, and once when you are not at a hospital or clinic. Record the average of the two measurements. To check your blood pressure when you are not at a hospital or clinic, you can use: ? An automated blood pressure machine at a pharmacy. ? A home blood pressure monitor.  If you are between 27 years and 28 years old, ask your health care provider if you should take  aspirin to prevent strokes.  Have regular diabetes screenings. This involves taking a blood sample to check your fasting blood sugar level. ? If you are at a normal weight and have a low risk for diabetes, have this test once every three years after 45 years of age. ? If you are overweight and have a high risk for diabetes, consider being tested at a younger age or more often. Preventing infection Hepatitis B  If you have a higher risk for hepatitis B, you should be screened for this virus. You are considered at high risk for hepatitis B if: ? You were born in a country where hepatitis B is common. Ask your health care provider which countries are considered high risk. ? Your parents were born in a high-risk country, and you have not been immunized against hepatitis B (hepatitis B vaccine). ? You have HIV or AIDS. ? You use needles to inject street drugs. ? You live with someone who has hepatitis B. ? You have had sex with someone who has hepatitis B. ? You get hemodialysis treatment. ? You take certain medicines for conditions, including cancer, organ transplantation, and autoimmune conditions.  Hepatitis C  Blood testing is recommended for: ? Everyone born from [birth year] through 1963. ? Anyone with known risk factors for hepatitis C.  Sexually transmitted infections (STIs)  You should be screened for sexually transmitted infections (STIs) including gonorrhea and chlamydia if: ? You are sexually active and are younger than 45 years of age. ? You are older than 45 years of age and your health care provider tells you that you are at risk for this type of infection. ? Your sexual activity has changed since you were last screened and you are at an increased risk for chlamydia or gonorrhea. Ask your health care provider if you are at risk.  If you do not have HIV, but are at risk, it may be recommended that you take a prescription medicine daily to prevent HIV infection. This is called  pre-exposure prophylaxis (PrEP). You are considered at risk if: ? You are sexually active and do not regularly use condoms or know the HIV status of your partner(s). ? You take drugs by injection. ? You are sexually active with a partner who has HIV.  Talk with your health care provider about whether you are at high risk of being infected with HIV. If you choose  to begin PrEP, you should first be tested for HIV. You should then be tested every 3 months for as long as you are taking PrEP. Pregnancy  If you are premenopausal and you may become pregnant, ask your health care provider about preconception counseling.  If you may become pregnant, take 400 to 800 micrograms (mcg) of folic acid every day.  If you want to prevent pregnancy, talk to your health care provider about birth control (contraception). Osteoporosis and menopause  Osteoporosis is a disease in which the bones lose minerals and strength with aging. This can result in serious bone fractures. Your risk for osteoporosis can be identified using a bone density scan.  If you are 41 years of age or older, or if you are at risk for osteoporosis and fractures, ask your health care provider if you should be screened.  Ask your health care provider whether you should take a calcium or vitamin D supplement to lower your risk for osteoporosis.  Menopause may have certain physical symptoms and risks.  Hormone replacement therapy may reduce some of these symptoms and risks. Talk to your health care provider about whether hormone replacement therapy is right for you. Follow these instructions at home:  Schedule regular health, dental, and eye exams.  Stay current with your immunizations.  Do not use any tobacco products including cigarettes, chewing tobacco, or electronic cigarettes.  If you are pregnant, do not drink alcohol.  If you are breastfeeding, limit how much and how often you drink alcohol.  Limit alcohol intake to no more  than 1 drink per day for nonpregnant women. One drink equals 12 ounces of beer, 5 ounces of wine, or 1 ounces of hard liquor.  Do not use street drugs.  Do not share needles.  Ask your health care provider for help if you need support or information about quitting drugs.  Tell your health care provider if you often feel depressed.  Tell your health care provider if you have ever been abused or do not feel safe at home. This information is not intended to replace advice given to you by your health care provider. Make sure you discuss any questions you have with your health care provider. Document Released: 07/16/2010 Document Revised: 06/08/2015 Document Reviewed: 10/04/2014 Elsevier Interactive Patient Education  [employer].

## 2017-10-04 LAB — URINE CULTURE
MICRO NUMBER: 91132123
RESULT: NO GROWTH
SPECIMEN QUALITY: ADEQUATE

## 2017-10-05 DIAGNOSIS — R3129 Other microscopic hematuria: Secondary | ICD-10-CM | POA: Insufficient documentation

## 2017-10-05 DIAGNOSIS — M549 Dorsalgia, unspecified: Secondary | ICD-10-CM | POA: Insufficient documentation

## 2017-10-05 HISTORY — DX: Other microscopic hematuria: R31.29

## 2017-10-05 NOTE — Assessment & Plan Note (Addendum)
Previously managed with low dose amlodipine . Home readings have been periodically  checked and are < 130/80.  encouraged to start exercising

## 2017-10-05 NOTE — Assessment & Plan Note (Signed)
Etiology unclear.  She has been having right sided back pain  Mild,nagging for the past week.  Will repeat UA in 2 weeks and if still present will need CT repeated and urology follow up

## 2017-10-05 NOTE — Assessment & Plan Note (Signed)
Annual comprehensive preventive exam was done as well as an evaluation and management of chronic conditions .  During the course of the visit the patient was educated and counseled about appropriate screening and preventive services including :  diabetes screening, lipid analysis with projected  10 year  risk for CAD , nutrition counseling, breast, cervical and colorectal cancer screening, and recommended immunizations.  Printed recommendations for health maintenance screenings was given 

## 2017-10-05 NOTE — Assessment & Plan Note (Signed)
Mild , present for the past week,  With dysuria reported as a symptom one day prior to the onset of the pain.   UA and culture negative for infection but she has microscopic hematuria.  (3 RBCs per field) .  History of left renal cyst,  Collapsed by last CT April 2018.   No history of stones.

## 2017-10-05 NOTE — Assessment & Plan Note (Signed)
previously seen cyst was not present on April 2018 CT

## 2017-10-06 LAB — MEASLES/MUMPS/RUBELLA IMMUNITY
Mumps IgG: <9 AU/mL — ABNORMAL LOW
RUBELLA: 1.37 {index}
Rubeola IgG: 27.2 AU/mL — ABNORMAL LOW

## 2017-10-29 ENCOUNTER — Telehealth: Payer: Self-pay | Admitting: Internal Medicine

## 2017-10-29 DIAGNOSIS — R3129 Other microscopic hematuria: Secondary | ICD-10-CM

## 2017-10-29 NOTE — Telephone Encounter (Signed)
Placed order for UA micro right?

## 2017-10-29 NOTE — Telephone Encounter (Signed)
Copied from [place] [id number]. Topic: General - Inquiry >> Oct 29, 2017  9:05 AM Margot Ables wrote: Reason for CRM: pt called to schedule repeat urinalysis - it is noted in lab results from 10/03/17 - pt is scheduled for 11/03/17 8:45am - I did not see an order in the system

## 2017-10-29 NOTE — Telephone Encounter (Signed)
Yes trhanks!

## 2017-11-03 ENCOUNTER — Other Ambulatory Visit (INDEPENDENT_AMBULATORY_CARE_PROVIDER_SITE_OTHER): Payer: No Typology Code available for payment source

## 2017-11-03 DIAGNOSIS — R3129 Other microscopic hematuria: Secondary | ICD-10-CM

## 2017-11-03 LAB — URINALYSIS, ROUTINE W REFLEX MICROSCOPIC
Bilirubin Urine: NEGATIVE
Ketones, ur: NEGATIVE
LEUKOCYTES UA: NEGATIVE
Nitrite: NEGATIVE
PH: 6 (ref 5.0–8.0)
RBC / HPF: NONE SEEN (ref 0–?)
SPECIFIC GRAVITY, URINE: 1.025 (ref 1.000–1.030)
TOTAL PROTEIN, URINE-UPE24: NEGATIVE
UROBILINOGEN UA: 0.2 (ref 0.0–1.0)
Urine Glucose: NEGATIVE

## 2017-11-04 ENCOUNTER — Other Ambulatory Visit: Payer: Self-pay | Admitting: Internal Medicine

## 2017-11-04 DIAGNOSIS — R3129 Other microscopic hematuria: Secondary | ICD-10-CM

## 2017-11-04 DIAGNOSIS — R82998 Other abnormal findings in urine: Secondary | ICD-10-CM

## 2017-11-04 NOTE — Progress Notes (Signed)
MyChart message sent   Ct scan ordered

## 2017-11-06 ENCOUNTER — Ambulatory Visit
Admission: RE | Admit: 2017-11-06 | Discharge: 2017-11-06 | Disposition: A | Discharge status: Home / Self Care | Payer: No Typology Code available for payment source | Source: Ambulatory Visit | Attending: Internal Medicine | Admitting: Internal Medicine

## 2017-11-06 DIAGNOSIS — K439 Ventral hernia without obstruction or gangrene: Secondary | ICD-10-CM | POA: Diagnosis not present

## 2017-11-06 DIAGNOSIS — N201 Calculus of ureter: Secondary | ICD-10-CM | POA: Insufficient documentation

## 2017-11-06 DIAGNOSIS — R82998 Other abnormal findings in urine: Secondary | ICD-10-CM | POA: Diagnosis not present

## 2017-11-06 DIAGNOSIS — R3129 Other microscopic hematuria: Secondary | ICD-10-CM | POA: Insufficient documentation

## 2017-11-06 DIAGNOSIS — K5730 Diverticulosis of large intestine without perforation or abscess without bleeding: Secondary | ICD-10-CM | POA: Insufficient documentation

## 2017-11-06 NOTE — Telephone Encounter (Deleted)
Left message for patient, my chart message sent

## 2017-11-14 MED ORDER — AMLODIPINE BESYLATE 2.5 MG PO TABS: 2.5000 mg | ORAL_TABLET | Freq: Every day | ORAL | 5 refills | Status: DC

## 2017-12-16 NOTE — Progress Notes (Signed)
GYN ANNUAL PREVENTATIVE CARE ENCOUNTER NOTE  Subjective:       Connie Francis is a 47 y.o. [phone] female here for a routine annual gynecologic exam.  Current complaints:   1. Patient is having pain in right side.  Ache like, comes and goes, no exacerbating or alleviating factors other than movement (almost feels like a stitch).  No change in bowel function noted.  No change in bladder function.  Patrick has a history of endometriosis and is status post transvaginal hysterectomy.  Recent CT scan is negative for reproductive tract pathology.  2.  Basilar symptoms persist.  She is just "dealing with them".  She did not start her estrogen replacement therapy as recommended last year.  Pros and cons have been reviewed this year and she is going to want a trial of medication.     Gynecologic History No LMP recorded. Patient has had a hysterectomy. Contraception: status post hysterectomy TVH Last Pap: Not needed Last mammogram: 09/2017 [facility] , previously followed by Dr. Jamal Collin, surgery; Dr. Derrel Nip is now ordering mammograms.  Obstetric History OB History  Gravida Para Term Preterm AB Living  3 2 2   1 2   SAB TAB Ectopic Multiple Live Births  1       2    # Outcome Date GA Lbr Len/2nd Weight Sex Delivery Anes PTL Lv  3 Term 2006    M CS-LTranv   LIV  2 Term 1995    M Vag-Spont   LIV  1 SAB 1995            Obstetric Comments  Menstrual: 53  First pregnancy at age 57    Past Medical History:  Diagnosis Date  . Abdominal bloating   . Anxiety   . 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 CHF (congestive heart failure) ([place]) 2006  . Chronic epigastric pain   . Constipation   . GERD (gastroesophageal reflux disease)   . Hiatal hernia 2017  . IBS (irritable bowel syndrome)   . Kidney stones   . Lump or mass in breast    R-Breast  . Migraine   . Obsessive compulsive disorder   . Thyroid disease    hyper    Past Surgical History:  Procedure Laterality Date  . BREAST  CYST ASPIRATION Left    negative 2010  . CESAREAN SECTION    . COLONOSCOPY WITH PROPOFOL N/A 07/27/2014   Procedure: COLONOSCOPY WITH PROPOFOL;  Surgeon: Lucilla Lame, MD;  Location: [place];  Service: Endoscopy;  Laterality: N/A;  . colonscopy  07/27/2015  . DILATION AND CURETTAGE OF UTERUS  2951   complicated by heart failure  . ESOPHAGOGASTRODUODENOSCOPY (EGD) WITH PROPOFOL N/A 07/27/2014   Procedure: ESOPHAGOGASTRODUODENOSCOPY (EGD) WITH PROPOFOL;  Surgeon: Lucilla Lame, MD;  Location: [place];  Service: Endoscopy;  Laterality: N/A;  . TUBAL LIGATION    . UPPER ENDOSCOPY W/ ESOPHAGEAL MANOMETRY    . VAGINAL HYSTERECTOMY      Current Outpatient Medications on File Prior to Visit  Medication Sig Dispense Refill  . ALPRAZolam (XANAX) 0.25 MG tablet Take 0.25 mg by mouth 2 (two) times daily as needed for anxiety.    Marland Kitchen amLODipine (NORVASC) 2.5 MG tablet Take 1 tablet (2.5 mg total) by mouth daily. 30 tablet 5  . polyethylene glycol (MIRALAX / GLYCOLAX) packet Take by mouth.     No current facility-administered medications on file prior to visit.     Allergies  Allergen Reactions  .  Latex     Swelling and itching   . Lisinopril Cough    cough  . Penicillins     swelling  . Promethazine   . Shellfish Allergy     hives  . Sulfa Antibiotics     Rash and itching   . Lamictal [Lamotrigine] Rash    Blisters     Social History   Socioeconomic History  . Marital status: Married    Spouse name: Not on file  . Number of children: Not on file  . Years of education: Not on file  . Highest education level: Not on file  Occupational History  . Not on file  Social Needs  . Financial resource strain: Not on file  . Food insecurity:    Worry: Not on file    Inability: Not on file  . Transportation needs:    Medical: Not on file    Non-medical: Not on file  Tobacco Use  . Smoking status: Never Smoker  . Smokeless tobacco: Never Used  Substance and  Sexual Activity  . Alcohol use: Yes    Alcohol/week: 1.0 standard drinks    Types: 1 Glasses of wine per week    Comment: Rare  . Drug use: No  . Sexual activity: Yes    Birth control/protection: Surgical  Lifestyle  . Physical activity:    Days per week: 2 days    Minutes per session: 20 min  . Stress: Not on file  Relationships  . Social connections:    Talks on phone: Not on file    Gets together: Not on file    Attends religious service: Not on file    Active member of club or organization: Not on file    Attends meetings of clubs or organizations: Not on file    Relationship status: Not on file  . Intimate partner violence:    Fear of current or ex partner: No    Emotionally abused: No    Physically abused: No    Forced sexual activity: No  Other Topics Concern  . Not on file  Social History Narrative  . Not on file    Family History  Problem Relation Age of Onset  . Cancer Maternal Aunt        parotid Ca  . Hearing loss Maternal Grandmother   . Alcohol abuse Maternal Grandmother   . Hypertension Mother   . Stroke Paternal Aunt   . Cancer Paternal Uncle        lung, nasopharnygeal  . Arthritis Maternal Grandfather   . Esophageal cancer Cousin 39  . Colon cancer Neg Hx   . Liver disease Neg Hx   . Breast cancer Neg Hx   . Ovarian cancer Neg Hx   . Diabetes Neg Hx     The following portions of the patient's history were reviewed and updated as appropriate: allergies, current medications, past family history, past medical history, past social history, past surgical history and problem list.  Review of Systems   Objective:  BP (!) 154/96   Pulse 71   Ht 5\' 2"  (1.575 m)   Wt 135 lb 9.6 oz (61.5 kg)   BMI 24.80 kg/m  Pleasant well-appearing female no acute distress.  Alert and oriented. HEENT exam: Normocephalic atraumatic Neck: Supple without thyromegaly or adenopathy Lungs: Clear Heart: Regular rate and rhythm without murmur Breasts: No dominant  mass adenopathy or nipple discharge Abdomen: Soft, nontender without organomegaly; Pfannenstiel incision is well-healed without hernia Bladder:  Non-tender Pelvic: External genitalia-normal BUS-normal Vagina-fair estrogen effect; no lesions; no palpable masses or tenderness Cervix-surgically absent Uterus-surgically absent Adnexa-nonpalpable nontender Rectovaginal-normal external exam; thin sphincter; no rectal masses Extremities: Warm and dry without edema Skin: Without rash  Assessment:   Annual gynecologic examination 47 y.o. Contraception: status post TVH Normal BMI Patient Active Problem List   Diagnosis Date Noted  . Right-sided back pain 10/05/2017  . Microscopic hematuria 10/05/2017  . Chronic constipation 12/17/2016  . Endometriosis 12/17/2016  . Recurrent headache 01/06/2016  . IBS (irritable bowel syndrome) 07/17/2014  . Status post [facility] 07/15/2014  . Cyst (solitary) of breast 07/12/2013  . Edema 01/18/2013  . Essential hypertension 01/18/2013  . Psychogenic dyspepsia 11/27/2012  . Visit for preventive health examination 06/18/2012  . Acquired cyst of kidney 02/18/2012  . H/O urinary stone 02/18/2012  . Depression with anxiety 01/07/2012  . Adaptive colitis 12/06/2011  . Obsessive compulsive disorder   . 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 Derrel Nip Stool Guaiac Testing:not indicated Routine preventative health maintenance measures emphasized: Diet/Weight control and Alcohol/Drug use Encourage increased water intake, Colace, high-fiber diet, Metamucil for chronic constipation/IBS Begin calcium 600 mg twice a day and vitamin D 400 international units twice a day Begin estrogen replacement therapy-estradiol 1 mg a day Literature on Orilissa is given for possible management of chronic right lower quadrant pain if it persists (may be endometriosis related).  Patient understands laparoscopy may  be considered also to assess for residual endometriosis within the pelvis Patient will see Dr. Helene Kelp 2 oh for annual gynecologic exams.  Patient understands that she may return here as needed if she has any gynecologic issues to see our physicians.   Durwin Glaze, CMA    Shaune Pascal CMA acting as scribe for Dr. Enzo Bi. I have reviewed, updated, and concur with the information scribed. Brayton Mars, MD   Note: This dictation was prepared with Dragon dictation along with smaller phrase technology. Any transcriptional errors that result from this process are unintentional.

## 2017-12-18 ENCOUNTER — Ambulatory Visit (INDEPENDENT_AMBULATORY_CARE_PROVIDER_SITE_OTHER): Payer: No Typology Code available for payment source | Admitting: Obstetrics and Gynecology

## 2017-12-18 ENCOUNTER — Encounter: Payer: Self-pay | Admitting: Obstetrics and Gynecology

## 2017-12-18 VITALS — BP 154/96 | HR 71 | Ht 62.0 in | Wt 135.6 lb

## 2017-12-18 DIAGNOSIS — Z90710 Acquired absence of both cervix and uterus: Secondary | ICD-10-CM | POA: Diagnosis not present

## 2017-12-18 DIAGNOSIS — N951 Menopausal and female climacteric states: Secondary | ICD-10-CM

## 2017-12-18 DIAGNOSIS — Z01419 Encounter for gynecological examination (general) (routine) without abnormal findings: Secondary | ICD-10-CM

## 2017-12-18 DIAGNOSIS — N809 Endometriosis, unspecified: Secondary | ICD-10-CM | POA: Diagnosis not present

## 2017-12-18 DIAGNOSIS — I10 Essential (primary) hypertension: Secondary | ICD-10-CM

## 2017-12-18 DIAGNOSIS — R1031 Right lower quadrant pain: Secondary | ICD-10-CM

## 2017-12-18 NOTE — Patient Instructions (Signed)
1.  No Pap smear is done.  No further Pap smears are needed due to vaginal hysterectomy. 2.  Mammogram is ordered by Dr. Derrel Nip. 3.  Screening labs are done by Dr. Derrel Nip. 4.  Hypertension management is through Dr. Derrel Nip; recently started amlodipine. 5.  Recommend calcium 600 mg twice a day and vitamin D 400 international units twice a day.  Sources of calcium include:  Citracal  Os-Cal  Viactiv chews  Tums 6.  Recommend restarting estradiol 1 mg daily for vasomotor symptoms and help with osteoporosis prevention.  Consider taking the ERT through age 45. 79.  If right lower quadrant pain persists, laparoscopy may be considered to evaluate whether or not the etiology is residual endometriosis.  Alternative management would be a trial of Orilissa.  Literature iis given. 8.  Patient will follow-up for annual exams with Dr. Derrel Nip.  If other gynecologic issues arise, she may return to see Dr. Amalia Hailey or Dr. Marcelline Mates. 9.  Vaginal lubricants include:  Astroglide  Jo H2O lubricant (good)  Health Maintenance for Postmenopausal Women Menopause is a normal process in which your reproductive ability comes to an end. This process happens gradually over a span of months to years, usually between the ages of 67 and 64. Menopause is complete when you have missed 12 consecutive menstrual periods. It is important to talk with your health care provider about some of the most common conditions that affect postmenopausal women, such as heart disease, cancer, and bone loss (osteoporosis). Adopting a healthy lifestyle and getting preventive care can help to promote your health and wellness. Those actions can also lower your chances of developing some of these common conditions. What should I know about menopause? During menopause, you may experience a number of symptoms, such as:  Moderate-to-severe hot flashes.  Night sweats.  Decrease in sex drive.  Mood swings.  Headaches.  Tiredness.  Irritability.  Memory  problems.  Insomnia.  Choosing to treat or not to treat menopausal changes is an individual decision that you make with your health care provider. What should I know about hormone replacement therapy and supplements? Hormone therapy products are effective for treating symptoms that are associated with menopause, such as hot flashes and night sweats. Hormone replacement carries certain risks, especially as you become older. If you are thinking about using estrogen or estrogen with progestin treatments, discuss the benefits and risks with your health care provider. What should I know about heart disease and stroke? Heart disease, heart attack, and stroke become more likely as you age. This may be due, in part, to the hormonal changes that your body experiences during menopause. These can affect how your body processes dietary fats, triglycerides, and cholesterol. Heart attack and stroke are both medical emergencies. There are many things that you can do to help prevent heart disease and stroke:  Have your blood pressure checked at least every 1-2 years. High blood pressure causes heart disease and increases the risk of stroke.  If you are 45-54 years old, ask your health care provider if you should take aspirin to prevent a heart attack or a stroke.  Do not use any tobacco products, including cigarettes, chewing tobacco, or electronic cigarettes. If you need help quitting, ask your health care provider.  It is important to eat a healthy diet and maintain a healthy weight. ? Be sure to include plenty of vegetables, fruits, low-fat dairy products, and lean protein. ? Avoid eating foods that are high in solid fats, added sugars, or  salt (sodium).  Get regular exercise. This is one of the most important things that you can do for your health. ? Try to exercise for at least 150 minutes each week. The type of exercise that you do should increase your heart rate and make you sweat. This is known as  moderate-intensity exercise. ? Try to do strengthening exercises at least twice each week. Do these in addition to the moderate-intensity exercise.  Know your numbers.Ask your health care provider to check your cholesterol and your blood glucose. Continue to have your blood tested as directed by your health care provider.  What should I know about cancer screening? There are several types of cancer. Take the following steps to reduce your risk and to catch any cancer development as early as possible. Breast Cancer  Practice breast self-awareness. ? This means understanding how your breasts normally appear and feel. ? It also means doing regular breast self-exams. Let your health care provider know about any changes, no matter how small.  If you are 30 or older, have a clinician do a breast exam (clinical breast exam or CBE) every year. Depending on your age, family history, and medical history, it may be recommended that you also have a yearly breast X-ray (mammogram).  If you have a family history of breast cancer, talk with your health care provider about genetic screening.  If you are at high risk for breast cancer, talk with your health care provider about having an MRI and a mammogram every year.  Breast cancer (BRCA) gene test is recommended for women who have family members with BRCA-related cancers. Results of the assessment will determine the need for genetic counseling and BRCA1 and for BRCA2 testing. BRCA-related cancers include these types: ? Breast. This occurs in males or females. ? Ovarian. ? Tubal. This may also be called fallopian tube cancer. ? Cancer of the abdominal or pelvic lining (peritoneal cancer). ? Prostate. ? Pancreatic.  Cervical, Uterine, and Ovarian Cancer Your health care provider may recommend that you be screened regularly for cancer of the pelvic organs. These include your ovaries, uterus, and vagina. This screening involves a pelvic exam, which  includes checking for microscopic changes to the surface of your cervix (Pap test).  For women ages 21-65, health care providers may recommend a pelvic exam and a Pap test every three years. For women ages 30-65, they may recommend the Pap test and pelvic exam, combined with testing for human papilloma virus (HPV), every five years. Some types of HPV increase your risk of cervical cancer. Testing for HPV may also be done on women of any age who have unclear Pap test results.  Other health care providers may not recommend any screening for nonpregnant women who are considered low risk for pelvic cancer and have no symptoms. Ask your health care provider if a screening pelvic exam is right for you.  If you have had past treatment for cervical cancer or a condition that could lead to cancer, you need Pap tests and screening for cancer for at least 20 years after your treatment. If Pap tests have been discontinued for you, your risk factors (such as having a new sexual partner) need to be reassessed to determine if you should start having screenings again. Some women have medical problems that increase the chance of getting cervical cancer. In these cases, your health care provider may recommend that you have screening and Pap tests more often.  If you have a family history of uterine  cancer or ovarian cancer, talk with your health care provider about genetic screening.  If you have vaginal bleeding after reaching menopause, tell your health care provider.  There are currently no reliable tests available to screen for ovarian cancer.  Lung Cancer Lung cancer screening is recommended for adults 73-65 years old who are at high risk for lung cancer because of a history of smoking. A yearly low-dose CT scan of the lungs is recommended if you:  Currently smoke.  Have a history of at least 30 pack-years of smoking and you currently smoke or have quit within the past 15 years. A pack-year is smoking an  average of one pack of cigarettes per day for one year.  Yearly screening should:  Continue until it has been 15 years since you quit.  Stop if you develop a health problem that would prevent you from having lung cancer treatment.  Colorectal Cancer  This type of cancer can be detected and can often be prevented.  Routine colorectal cancer screening usually begins at age 17 and continues through age 12.  If you have risk factors for colon cancer, your health care provider may recommend that you be screened at an earlier age.  If you have a family history of colorectal cancer, talk with your health care provider about genetic screening.  Your health care provider may also recommend using home test kits to check for hidden blood in your stool.  A small camera at the end of a tube can be used to examine your colon directly (sigmoidoscopy or colonoscopy). This is done to check for the earliest forms of colorectal cancer.  Direct examination of the colon should be repeated every 5-10 years until age 28. However, if early forms of precancerous polyps or small growths are found or if you have a family history or genetic risk for colorectal cancer, you may need to be screened more often.  Skin Cancer  Check your skin from head to toe regularly.  Monitor any moles. Be sure to tell your health care provider: ? About any new moles or changes in moles, especially if there is a change in a mole's shape or color. ? If you have a mole that is larger than the size of a pencil eraser.  If any of your family members has a history of skin cancer, especially at a young age, talk with your health care provider about genetic screening.  Always use sunscreen. Apply sunscreen liberally and repeatedly throughout the day.  Whenever you are outside, protect yourself by wearing long sleeves, pants, a wide-brimmed hat, and sunglasses.  What should I know about osteoporosis? Osteoporosis is a condition in  which bone destruction happens more quickly than new bone creation. After menopause, you may be at an increased risk for osteoporosis. To help prevent osteoporosis or the bone fractures that can happen because of osteoporosis, the following is recommended:  If you are 76-35 years old, get at least 1,000 mg of calcium and at least 600 mg of vitamin D per day.  If you are older than age 3 but younger than age 89, get at least 1,200 mg of calcium and at least 600 mg of vitamin D per day.  If you are older than age 69, get at least 1,200 mg of calcium and at least 800 mg of vitamin D per day.  Smoking and excessive alcohol intake increase the risk of osteoporosis. Eat foods that are rich in calcium and vitamin D, and do weight-bearing  exercises several times each week as directed by your health care provider. What should I know about how menopause affects my mental health? Depression may occur at any age, but it is more common as you become older. Common symptoms of depression include:  Low or sad mood.  Changes in sleep patterns.  Changes in appetite or eating patterns.  Feeling an overall lack of motivation or enjoyment of activities that you previously enjoyed.  Frequent crying spells.  Talk with your health care provider if you think that you are experiencing depression. What should I know about immunizations? It is important that you get and maintain your immunizations. These include:  Tetanus, diphtheria, and pertussis (Tdap) booster vaccine.  Influenza every year before the flu season begins.  Pneumonia vaccine.  Shingles vaccine.  Your health care provider may also recommend other immunizations. This information is not intended to replace advice given to you by your health care provider. Make sure you discuss any questions you have with your health care provider. Document Released: 02/22/2005 Document Revised: 07/21/2015 Document Reviewed: 10/04/2014 Elsevier Interactive  Patient Education  2018 [employer].

## 2018-01-23 ENCOUNTER — Other Ambulatory Visit
Admission: RE | Admit: 2018-01-23 | Discharge: 2018-01-23 | Disposition: A | Discharge status: Home / Self Care | Payer: No Typology Code available for payment source | Source: Ambulatory Visit | Attending: Occupational Medicine | Admitting: Occupational Medicine

## 2018-04-07 ENCOUNTER — Telehealth: Payer: No Typology Code available for payment source | Admitting: Family

## 2018-04-07 DIAGNOSIS — J019 Acute sinusitis, unspecified: Secondary | ICD-10-CM | POA: Diagnosis not present

## 2018-04-07 DIAGNOSIS — B9689 Other specified bacterial agents as the cause of diseases classified elsewhere: Secondary | ICD-10-CM

## 2018-04-07 MED ORDER — AZITHROMYCIN 250 MG PO TABS: ORAL_TABLET | ORAL | 0 refills | Status: DC

## 2018-04-07 NOTE — Progress Notes (Signed)
We are sorry that you are not feeling well.  Here is how we plan to help!  Based on what you have shared with me it looks like you have sinusitis.  Sinusitis is inflammation and infection in the sinus cavities of the head.  Based on your presentation I believe you most likely have Acute Bacterial Sinusitis.  This is an infection caused by bacteria and is treated with antibiotics. I have prescribed Z-pak as directed. You may use an oral decongestant such as Mucinex D or if you have glaucoma or high blood pressure use plain Mucinex. Saline nasal spray help and can safely be used as often as needed for congestion.  If you develop worsening sinus pain, fever or notice severe headache and vision changes, or if symptoms are not better after completion of antibiotic, please schedule an appointment with a health care provider.    Sinus infections are not as easily transmitted as other respiratory infection, however we still recommend that you avoid close contact with loved ones, especially the very young and elderly.  Remember to wash your hands thoroughly throughout the day as this is the number one way to prevent the spread of infection!  Home Care:  Only take medications as instructed by your medical team.  Complete the entire course of an antibiotic.  Do not take these medications with alcohol.  A steam or ultrasonic humidifier can help congestion.  You can place a towel over your head and breathe in the steam from hot water coming from a faucet.  Avoid close contacts especially the very young and the elderly.  Cover your mouth when you cough or sneeze.  Always remember to wash your hands.  Get Help Right Away If:  You develop worsening fever or sinus pain.  You develop a severe head ache or visual changes.  Your symptoms persist after you have completed your treatment plan.  Make sure you  Understand these instructions.  Will watch your condition.  Will get help right away if you are  not doing well or get worse.  Your e-visit answers were reviewed by a board certified advanced clinical practitioner to complete your personal care plan.  Depending on the condition, your plan could have included both over the counter or prescription medications.  If there is a problem please reply  once you have received a response from your provider.  Your safety is important to us.  If you have drug allergies check your prescription carefully.    You can use MyChart to ask questions about today's visit, request a non-urgent call back, or ask for a work or school excuse for 24 hours related to this e-Visit. If it has been greater than 24 hours you will need to follow up with your provider, or enter a new e-Visit to address those concerns.  You will get an e-mail in the next two days asking about your experience.  I hope that your e-visit has been valuable and will speed your recovery. Thank you for using e-visits.   

## 2018-06-09 ENCOUNTER — Ambulatory Visit (INDEPENDENT_AMBULATORY_CARE_PROVIDER_SITE_OTHER): Payer: No Typology Code available for payment source | Admitting: Internal Medicine

## 2018-06-09 ENCOUNTER — Encounter: Payer: Self-pay | Admitting: Internal Medicine

## 2018-06-09 ENCOUNTER — Other Ambulatory Visit (INDEPENDENT_AMBULATORY_CARE_PROVIDER_SITE_OTHER): Payer: No Typology Code available for payment source

## 2018-06-09 ENCOUNTER — Other Ambulatory Visit: Payer: Self-pay

## 2018-06-09 DIAGNOSIS — M546 Pain in thoracic spine: Secondary | ICD-10-CM

## 2018-06-09 DIAGNOSIS — R1031 Right lower quadrant pain: Secondary | ICD-10-CM | POA: Diagnosis not present

## 2018-06-09 NOTE — Assessment & Plan Note (Signed)
Recurrent with rlq pain that started several days ago .  Checking urinalysis to rule out renal stones, and checking CBC to rule out infection.

## 2018-06-09 NOTE — Assessment & Plan Note (Signed)
ddx includes appendicitis, renal calculi, ovarian cyst.  Pain is not severe  Checking urine studies and CBC/ESR. Marland Kitchen

## 2018-06-09 NOTE — Progress Notes (Signed)
Virtual Visit via [email or web address]  This visit type was conducted due to national recommendations for restrictions regarding the COVID-19 pandemic (e.g. social distancing).  This format is felt to be most appropriate for this patient at this time.  All issues noted in this document were discussed and addressed.  No physical exam was performed (except for noted visual exam findings with Video Visits).   I connected with@ on 06/09/18 at  2:00 PM EDT by a video enabled telemedicine application or telephone and verified that I am speaking with the correct person using two identifiers. Location patient: home Location provider: work  Persons participating in the virtual visit: patient, provider  I discussed the limitations, risks, security and privacy concerns of performing an evaluation and management service by telephone and the availability of in person appointments. I also discussed with the patient that there may be a patient responsible charge related to this service. The patient expressed understanding and agreed to proceed.  Reason for visit:   HPI:   48 yr old female with history of IBS-constipation (no mention of  tics by 2016 colonoscopy, or by normal abd/pelvic CT done in April 2018 for similar symptoms  ) and renal calculi , s/p hysterectomy presents with RLQ pain  That started Friday morning ( 4 days ago).  She describes the pain as deep and dull and radiating to right costovertebral angle. She took an extra miralax dose because she attributed the pain to constipation.  She had one loose stool on Friday  Sat and Sun but no blood , cramping,  Or fevers.  T max was 99.7 .  She hiked for an hour on Sunday without aggravation of pain . Pain is not stabbing,  More like a deep dull ache  That is  aggravated by sleeping.and improves with activity.       ROS: See pertinent positives and negatives per HPI.  Past Medical History:  Diagnosis Date  . Abdominal bloating   . Anxiety   . Cardiomyopathy in the  puerperium 09/13/2010  . CHF (congestive heart failure) ([place]) 2006  . Chronic epigastric pain   . Constipation   . GERD (gastroesophageal reflux disease)   . Hiatal hernia 2017  . IBS (irritable bowel syndrome)   . Kidney stones   . Lump or mass in breast    R-Breast  . Migraine   . Obsessive compulsive disorder   . Thyroid disease    hyper    Past Surgical History:  Procedure Laterality Date  . BREAST CYST ASPIRATION Left    negative 2010  . CESAREAN SECTION    . COLONOSCOPY WITH PROPOFOL N/A 07/27/2014   Procedure: COLONOSCOPY WITH PROPOFOL;  Surgeon: Lucilla Lame, MD;  Location: [facility];  Service: Endoscopy;  Laterality: N/A;  . colonscopy  07/27/2015  . DILATION AND CURETTAGE OF UTERUS  1194   complicated by heart failure  . ESOPHAGOGASTRODUODENOSCOPY (EGD) WITH PROPOFOL N/A 07/27/2014   Procedure: ESOPHAGOGASTRODUODENOSCOPY (EGD) WITH PROPOFOL;  Surgeon: Lucilla Lame, MD;  Location: [facility];  Service: Endoscopy;  Laterality: N/A;  . TUBAL LIGATION    . UPPER ENDOSCOPY W/ ESOPHAGEAL MANOMETRY    . VAGINAL HYSTERECTOMY      Family History  Problem Relation Age of Onset  . Cancer Maternal Aunt        parotid Ca  . Hearing loss Maternal Grandmother   . Alcohol abuse Maternal Grandmother   . Hypertension Mother   . Stroke Paternal Aunt   . Cancer  Paternal Uncle        lung, nasopharnygeal  . Arthritis Maternal Grandfather   . Esophageal cancer Cousin 1  . Colon cancer Neg Hx   . Liver disease Neg Hx   . Breast cancer Neg Hx   . Ovarian cancer Neg Hx   . Diabetes Neg Hx     SOCIAL HX:  reports that she has never smoked. She has never used smokeless tobacco. She reports current alcohol use of about 1.0 standard drinks of alcohol per week. She reports that she does not use drugs.   Current Outpatient Medications:  .  ALPRAZolam (XANAX) 0.25 MG tablet, Take 0.25 mg by mouth 2 (two) times daily as needed for anxiety., Disp: , Rfl:  .  amLODipine  (NORVASC) 2.5 MG tablet, Take 1 tablet (2.5 mg total) by mouth daily., Disp: 30 tablet, Rfl: 5 .  citalopram (CELEXA) 10 MG tablet, Take 10 mg by mouth daily. , Disp: , Rfl:  .  polyethylene glycol (MIRALAX / GLYCOLAX) packet, Take by mouth., Disp: , Rfl:   EXAM:  VITALS per patient if applicable:  GENERAL: alert, oriented, appears well and in no acute distress  HEENT: atraumatic, conjunttiva clear, no obvious abnormalities on inspection of external nose and ears  NECK: normal movements of the head and neck  LUNGS: on inspection no signs of respiratory distress, breathing rate appears normal, no obvious gross SOB, gasping or wheezing  CV: no obvious cyanosis  MS: moves all visible extremities without noticeable abnormality  PSYCH/NEURO: pleasant and cooperative, no obvious depression or anxiety, speech and thought processing grossly intact  ASSESSMENT AND PLAN:  Discussed the following assessment and plan:  Abdominal pain, RLQ - Plan: CBC with Differential/Platelet, Sedimentation rate, Comprehensive metabolic panel, POCT urinalysis dipstick, Urine Microscopic Only, Urine Culture  RLQ abdominal pain  Acute right-sided thoracic back pain  RLQ abdominal pain ddx includes appendicitis, renal calculi, ovarian cyst.  Pain is not severe  Checking urine studies and CBC/ESR. .    Right-sided back pain Recurrent with rlq pain that started several days ago .  Checking urinalysis to rule out renal stones, and checking CBC to rule out infection.      I discussed the assessment and treatment plan with the patient. The patient was provided an opportunity to ask questions and all were answered. The patient agreed with the plan and demonstrated an understanding of the instructions.   The patient was advised to call back or seek an in-person evaluation if the symptoms worsen or if the condition fails to improve as anticipated.  I provided 25 minutes of non-face-to-face time during this  encounter.   Crecencio Mc, MD

## 2018-06-10 LAB — CBC WITH DIFFERENTIAL/PLATELET
Basophils Absolute: 0 10*3/uL (ref 0.0–0.1)
Basophils Relative: 0.5 % (ref 0.0–3.0)
Eosinophils Absolute: 0 10*3/uL (ref 0.0–0.7)
Eosinophils Relative: 0.4 % (ref 0.0–5.0)
HCT: 39.4 % (ref 36.0–46.0)
Hemoglobin: 13.4 g/dL (ref 12.0–15.0)
Lymphocytes Relative: 24.1 % (ref 12.0–46.0)
Lymphs Abs: 1.8 10*3/uL (ref 0.7–4.0)
MCHC: 34 g/dL (ref 30.0–36.0)
MCV: 89.6 fl (ref 78.0–100.0)
Monocytes Absolute: 0.6 10*3/uL (ref 0.1–1.0)
Monocytes Relative: 7.7 % (ref 3.0–12.0)
Neutro Abs: 5 10*3/uL (ref 1.4–7.7)
Neutrophils Relative %: 67.3 % (ref 43.0–77.0)
Platelets: 284 10*3/uL (ref 150.0–400.0)
RBC: 4.4 Mil/uL (ref 3.87–5.11)
RDW: 13 % (ref 11.5–15.5)
WBC: 7.4 10*3/uL (ref 4.0–10.5)

## 2018-06-10 LAB — POCT URINALYSIS DIPSTICK
Bilirubin, UA: NEGATIVE
Glucose, UA: NEGATIVE
Ketones, UA: NEGATIVE
Leukocytes, UA: NEGATIVE
Nitrite, UA: NEGATIVE
Protein, UA: NEGATIVE
Spec Grav, UA: 1.02 (ref 1.010–1.025)
Urobilinogen, UA: 0.2 E.U./dL
pH, UA: 6 (ref 5.0–8.0)

## 2018-06-10 LAB — URINALYSIS, MICROSCOPIC ONLY: RBC / HPF: NONE SEEN (ref 0–?)

## 2018-06-10 LAB — COMPREHENSIVE METABOLIC PANEL
ALT: 11 U/L (ref 0–35)
AST: 13 U/L (ref 0–37)
Albumin: 4.6 g/dL (ref 3.5–5.2)
Alkaline Phosphatase: 86 U/L (ref 39–117)
BUN: 12 mg/dL (ref 6–23)
CO2: 30 mEq/L (ref 19–32)
Calcium: 9.7 mg/dL (ref 8.4–10.5)
Chloride: 102 mEq/L (ref 96–112)
Creatinine, Ser: 0.79 mg/dL (ref 0.40–1.20)
GFR: 78.41 mL/min (ref >60.00)
Glucose, Bld: 97 mg/dL (ref 70–99)
Potassium: 4.4 mEq/L (ref 3.5–5.1)
Sodium: 140 mEq/L (ref 135–145)
Total Bilirubin: 0.3 mg/dL (ref 0.2–1.2)
Total Protein: 7.5 g/dL (ref 6.0–8.3)

## 2018-06-10 LAB — SEDIMENTATION RATE: Sed Rate: 28 mm/hr — ABNORMAL HIGH (ref 0–20)

## 2018-06-10 NOTE — Addendum Note (Signed)
Addended by: Leeanne Rio on: 06/10/2018 09:51 AM   Modules accepted: Orders

## 2018-06-11 ENCOUNTER — Other Ambulatory Visit: Payer: Self-pay | Admitting: Internal Medicine

## 2018-06-11 DIAGNOSIS — R1031 Right lower quadrant pain: Secondary | ICD-10-CM

## 2018-06-11 LAB — URINE CULTURE
MICRO NUMBER:: 509882
Result:: NO GROWTH
SPECIMEN QUALITY:: ADEQUATE

## 2018-06-25 ENCOUNTER — Ambulatory Visit
Admission: RE | Admit: 2018-06-25 | Discharge: 2018-06-25 | Disposition: A | Discharge status: Home / Self Care | Payer: No Typology Code available for payment source | Source: Ambulatory Visit | Attending: Internal Medicine | Admitting: Internal Medicine

## 2018-06-25 ENCOUNTER — Other Ambulatory Visit: Payer: Self-pay

## 2018-06-25 DIAGNOSIS — R1031 Right lower quadrant pain: Secondary | ICD-10-CM | POA: Diagnosis not present

## 2018-06-25 HISTORY — DX: Essential (primary) hypertension: I10

## 2018-06-25 MED ORDER — IOHEXOL 300 MG/ML  SOLN
100.0000 mL | Freq: Once | INTRAMUSCULAR | Status: AC | PRN
  Administered 2018-06-25: 80 mL via INTRAVENOUS

## 2018-08-06 ENCOUNTER — Telehealth: Payer: No Typology Code available for payment source | Admitting: Physician Assistant

## 2018-08-06 DIAGNOSIS — B351 Tinea unguium: Secondary | ICD-10-CM

## 2018-08-06 NOTE — Progress Notes (Signed)
Based on what you shared with me, I feel your condition warrants further evaluation and I recommend that you be seen for a face to face office visit.  I agree that symptoms and picture do seem more consistent with a nail fungus (onychomycosis) and not athlete's foot (tinea pedis). Onychomycosis requires more chronic therapy to resolve and is not something that we can treat via e-visit as these are for acute issues only. There are some topical medications that can be used for this but the ones that are effective are typically very expensive. Oral medication work very well but have to be taken for ~ 3 months for toenails and require periodic lab monitoring. I would recommend your each out to your PCP (I have Dr. Derrel Nip listed) to schedule a virtual visit/in-office visit so that she can assess and get you taken care of. Please consider starting a nail soak of equal parts warm, clean water and distilled white vinegar -- 10 minutes, a couple of times per week. Vinegar is a natural antifungal and can start to help clear this up over time in milder cases. Thankfully these fungal infections of the nails are not harmful and more of cosmetic issue.   NOTE: If you entered your credit card information for this eVisit, you will not be charged. You may see a "hold" on your card for the $35 but that hold will drop off and you will not have a charge processed.  If you are having a true medical emergency please call 911.     For an urgent face to face visit, [facility] has five urgent care centers for your convenience:    [email or web address] to reserve your spot online an avoid wait times  [facility] (New Address!) [address] [place], [place] [address] *Just off [address], across the road from [place] hours of operation: Monday-Friday, 12 PM to 6 PM  Closed Saturday & Sunday   The following sites will take your insurance:  . [facility]    [phone]                  Get Driving Directions  [address], [place] [address] . 10 am to 8 pm Monday-Friday . 12 pm to 8 pm Saturday-Sunday   . [facility] at [place]                  Get Driving Directions  [address], [address] [place], [place] [address] . 8 am to 8 pm Monday-Friday . 9 am to 6 pm Saturday . 11 am to 6 pm Sunday   . [facility] at [place]                  Get Driving Directions   [address] [place], [place] [address] . 8 am to 8 pm Monday-Friday . 8 am to 4 pm Saturday-Sunday    . [facility] at                     Get Driving Directions  [phone]  [address] [place], [place] [address]  . Monday-Friday, 12 PM to 6 PM    Your e-visit answers were reviewed by a board certified advanced clinical practitioner to complete your personal care plan.  Thank you for using e-Visits.

## 2018-08-13 ENCOUNTER — Encounter: Payer: Self-pay | Admitting: Internal Medicine

## 2018-08-13 ENCOUNTER — Telehealth (INDEPENDENT_AMBULATORY_CARE_PROVIDER_SITE_OTHER): Payer: No Typology Code available for payment source | Admitting: Internal Medicine

## 2018-08-13 ENCOUNTER — Other Ambulatory Visit: Payer: Self-pay

## 2018-08-13 DIAGNOSIS — Z79899 Other long term (current) drug therapy: Secondary | ICD-10-CM | POA: Insufficient documentation

## 2018-08-13 DIAGNOSIS — B351 Tinea unguium: Secondary | ICD-10-CM | POA: Diagnosis not present

## 2018-08-13 MED ORDER — TERBINAFINE HCL 250 MG PO TABS: 250.0000 mg | ORAL_TABLET | Freq: Every day | ORAL | 0 refills | Status: DC

## 2018-08-13 NOTE — Progress Notes (Signed)
Virtual Visit via [email or web address]  This visit type was conducted due to national recommendations for restrictions regarding the COVID-19 pandemic (e.g. social distancing).  This format is felt to be most appropriate for this patient at this time.  All issues noted in this document were discussed and addressed.  No physical exam was performed (except for noted visual exam findings with Video Visits).   I connected with@ on 08/13/18 at  4:30 PM EDT by a video enabled telemedicine application or telephone and verified that I am speaking with the correct person using two identifiers. Location patient: home Location provider: work or home office Persons participating in the virtual visit: patient, provider  I discussed the limitations, risks, security and privacy concerns of performing an evaluation and management service by telephone and the availability of in person appointments. I also discussed with the patient that there may be a patient responsible charge related to this service. The patient expressed understanding and agreed to proceed.  Reason for visit: onychomycosis of left great toenail  HPI:  48 yr old female presents with 2 -3 week history of nail changes involving the left great toenail c/w onychomycosis (yellowing of nail)  After having a pedicure.   ROS: See pertinent positives and negatives per HPI.  Past Medical History:  Diagnosis Date  . Abdominal bloating   . Anxiety   . 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 CHF (congestive heart failure) ([place]) 2006  . Chronic epigastric pain   . Constipation   . GERD (gastroesophageal reflux disease)   . Hiatal hernia 2017  . Hypertension   . IBS (irritable bowel syndrome)   . Kidney stones   . Lump or mass in breast    R-Breast  . Migraine   . Obsessive compulsive disorder   . Thyroid disease    hyper    Past Surgical History:  Procedure Laterality Date  . BREAST CYST ASPIRATION Left    negative 2010  . CESAREAN SECTION     . COLONOSCOPY WITH PROPOFOL N/A 07/27/2014   Procedure: COLONOSCOPY WITH PROPOFOL;  Surgeon: Lucilla Lame, MD;  Location: [facility];  Service: Endoscopy;  Laterality: N/A;  . colonscopy  07/27/2015  . DILATION AND CURETTAGE OF UTERUS  8756   complicated by heart failure  . ESOPHAGOGASTRODUODENOSCOPY (EGD) WITH PROPOFOL N/A 07/27/2014   Procedure: ESOPHAGOGASTRODUODENOSCOPY (EGD) WITH PROPOFOL;  Surgeon: Lucilla Lame, MD;  Location: [facility];  Service: Endoscopy;  Laterality: N/A;  . TUBAL LIGATION    . UPPER ENDOSCOPY W/ ESOPHAGEAL MANOMETRY    . VAGINAL HYSTERECTOMY      Family History  Problem Relation Age of Onset  . Cancer Maternal Aunt        parotid Ca  . Hearing loss Maternal Grandmother   . Alcohol abuse Maternal Grandmother   . Hypertension Mother   . Stroke Paternal Aunt   . Cancer Paternal Uncle        lung, nasopharnygeal  . Arthritis Maternal Grandfather   . Esophageal cancer Cousin 60  . Colon cancer Neg Hx   . Liver disease Neg Hx   . Breast cancer Neg Hx   . Ovarian cancer Neg Hx   . Diabetes Neg Hx     SOCIAL HX:  reports that she has never smoked. She has never used smokeless tobacco. She reports current alcohol use of about 1.0 standard drinks of alcohol per week. She reports that she does not use drugs.   Current Outpatient Medications:  .  ALPRAZolam (XANAX) 0.25 MG tablet, Take 0.25 mg by mouth 2 (two) times daily as needed for anxiety., Disp: , Rfl:  .  amLODipine (NORVASC) 2.5 MG tablet, Take 1 tablet (2.5 mg total) by mouth daily., Disp: 30 tablet, Rfl: 5 .  citalopram (CELEXA) 10 MG tablet, Take 10 mg by mouth daily. , Disp: , Rfl:  .  polyethylene glycol (MIRALAX / GLYCOLAX) packet, Take by mouth., Disp: , Rfl:  .  terbinafine (LAMISIL) 250 MG tablet, Take 1 tablet (250 mg total) by mouth daily., Disp: 90 tablet, Rfl: 0  EXAM:  VITALS per patient if applicable:  GENERAL: alert, oriented, appears well and in no acute  distress  HEENT: atraumatic, conjunttiva clear, no obvious abnormalities on inspection of external nose and ears  NECK: normal movements of the head and neck  LUNGS: on inspection no signs of respiratory distress, breathing rate appears normal, no obvious gross SOB, gasping or wheezing  CV: no obvious cyanosis  MS: moves all visible extremities without noticeable abnormality  Ext:  Right great toe with yellowed thickened nail   PSYCH/NEURO: pleasant and cooperative, no obvious depression or anxiety, speech and thought processing grossly intact  ASSESSMENT AND PLAN:  Onychomycosis of left great toe Risk factors include use of professional commercial nail salons for pedicures.   Lamisil 25 0 mg daily x 12 weeks,  Baseline liver enzymes normal < 2 months ago.  Repeat in 3-4 weeks     I discussed the assessment and treatment plan with the patient. The patient was provided an opportunity to ask questions and all were answered. The patient agreed with the plan and demonstrated an understanding of the instructions.   The patient was advised to call back or seek an in-person evaluation if the symptoms worsen or if the condition fails to improve as anticipated.  I provided 15 minutes of non-face-to-face time during this encounter.   Crecencio Mc, MD

## 2018-08-13 NOTE — Assessment & Plan Note (Signed)
Risk factors include use of professional commercial nail salons for pedicures.   Lamisil 25 0 mg daily x 12 weeks,  Baseline liver enzymes normal < 2 months ago.  Repeat in 3-4 weeks

## 2018-08-26 ENCOUNTER — Other Ambulatory Visit: Payer: Self-pay | Admitting: Internal Medicine

## 2018-08-26 DIAGNOSIS — Z1231 Encounter for screening mammogram for malignant neoplasm of breast: Secondary | ICD-10-CM

## 2018-09-04 ENCOUNTER — Encounter: Payer: Self-pay | Admitting: Emergency Medicine

## 2018-09-04 ENCOUNTER — Ambulatory Visit
Admission: EM | Admit: 2018-09-04 | Discharge: 2018-09-04 | Disposition: A | Discharge status: Home / Self Care | Payer: No Typology Code available for payment source | Attending: Family Medicine | Admitting: Family Medicine

## 2018-09-04 ENCOUNTER — Other Ambulatory Visit: Payer: Self-pay

## 2018-09-04 DIAGNOSIS — A084 Viral intestinal infection, unspecified: Secondary | ICD-10-CM | POA: Diagnosis not present

## 2018-09-04 MED ORDER — ONDANSETRON 8 MG PO TBDP: 8.0000 mg | ORAL_TABLET | Freq: Three times a day (TID) | ORAL | 0 refills | Status: DC | PRN

## 2018-09-04 NOTE — ED Triage Notes (Signed)
Patient c/o stomach cramps on Tuesday.  Patient c/o loose stools that started on Wed.   Patient reports history of IBS.  Patient also reports having diverticulosis. Patient report fever on Wed.

## 2018-09-04 NOTE — ED Provider Notes (Signed)
[facility]    CSN: [id number] Arrival date & time: 09/04/18  1209      History   Chief Complaint Chief Complaint  Patient presents with  . Abdominal Pain    right  . Fever  . Diarrhea    HPI Connie Francis is a 48 y.o. female.   48 yo female with a c/o abdominal cramping, nausea, low grade fevers and loose stools for the past 4 days. Pain is diffuse but seems more concentrated on the right side. Denies any vomiting, melena, hematochezia. States she has a h/o diverticulosis and IBS. Fevers have been 99-100. Denies any known sick contacts or suspicious foods.    Abdominal Pain Associated symptoms: diarrhea and fever   Fever Associated symptoms: diarrhea   Diarrhea Associated symptoms: abdominal pain and fever     Past Medical History:  Diagnosis Date  . Abdominal bloating   . Anxiety   . 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 CHF (congestive heart failure) ([place]) 2006  . Chronic epigastric pain   . Constipation   . GERD (gastroesophageal reflux disease)   . Hiatal hernia 2017  . Hypertension   . IBS (irritable bowel syndrome)   . Kidney stones   . Lump or mass in breast    R-Breast  . Migraine   . Obsessive compulsive disorder   . Thyroid disease    hyper    Patient Active Problem List   Diagnosis Date Noted  . Onychomycosis of left great toe 08/13/2018  . Long-term use of high-risk medication 08/13/2018  . RLQ abdominal pain 06/09/2018  . Right-sided back pain 10/05/2017  . Microscopic hematuria 10/05/2017  . Chronic constipation 12/17/2016  . Endometriosis 12/17/2016  . Recurrent headache 01/06/2016  . IBS (irritable bowel syndrome) 07/17/2014  . Status post [facility] 07/15/2014  . Cyst (solitary) of breast 07/12/2013  . Edema 01/18/2013  . Essential hypertension 01/18/2013  . Psychogenic dyspepsia 11/27/2012  . Visit for preventive health examination 06/18/2012  . Acquired cyst of kidney 02/18/2012  . H/O urinary stone  02/18/2012  . Depression with anxiety 01/07/2012  . Adaptive colitis 12/06/2011  . Obsessive compulsive disorder   . 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 BREAST CYST ASPIRATION Left    negative 2010  . CESAREAN SECTION    . COLONOSCOPY WITH PROPOFOL N/A 07/27/2014   Procedure: COLONOSCOPY WITH PROPOFOL;  Surgeon: Lucilla Lame, MD;  Location: [facility];  Service: Endoscopy;  Laterality: N/A;  . colonscopy  07/27/2015  . DILATION AND CURETTAGE OF UTERUS  123456   complicated by heart failure  . ESOPHAGOGASTRODUODENOSCOPY (EGD) WITH PROPOFOL N/A 07/27/2014   Procedure: ESOPHAGOGASTRODUODENOSCOPY (EGD) WITH PROPOFOL;  Surgeon: Lucilla Lame, MD;  Location: [facility];  Service: Endoscopy;  Laterality: N/A;  . TUBAL LIGATION    . UPPER ENDOSCOPY W/ ESOPHAGEAL MANOMETRY    . VAGINAL HYSTERECTOMY      OB History    Gravida  3   Para  2   Term  2   Preterm      AB  1   Living  2     SAB  1   TAB      Ectopic      Multiple      Live Births  2        Obstetric Comments  Menstrual: 73 First pregnancy at age 87         Home  Medications    Prior to Admission medications   Medication Sig Start Date End Date Taking? Authorizing Provider  ALPRAZolam (XANAX) 0.25 MG tablet Take 0.25 mg by mouth 2 (two) times daily as needed for anxiety.   Yes [provider]  amLODipine (NORVASC) 2.5 MG tablet Take 1 tablet (2.5 mg total) by mouth daily. 11/14/17  Yes Crecencio Mc, MD  citalopram (CELEXA) 10 MG tablet Take 10 mg by mouth daily.  03/20/18  Yes [provider]  ondansetron (ZOFRAN ODT) 8 MG disintegrating tablet Take 1 tablet (8 mg total) by mouth every 8 (eight) hours as needed. 09/04/18   Norval Gable, MD  polyethylene glycol [facility] / GLYCOLAX) packet Take by mouth. 08/28/16   [provider]  terbinafine (LAMISIL) 250 MG tablet Take 1 tablet (250 mg total) by mouth daily.  08/13/18   Crecencio Mc, MD    Family History Family History  Problem Relation Age of Onset  . Cancer Maternal Aunt        parotid Ca  . Hearing loss Maternal Grandmother   . Alcohol abuse Maternal Grandmother   . Hypertension Mother   . Stroke Paternal Aunt   . Cancer Paternal Uncle        lung, nasopharnygeal  . Arthritis Maternal Grandfather   . Esophageal cancer Cousin 28  . Colon cancer Neg Hx   . Liver disease Neg Hx   . Breast cancer Neg Hx   . Ovarian cancer Neg Hx   . Diabetes Neg Hx     Social History Social History   Tobacco Use  . Smoking status: Never Smoker  . Smokeless tobacco: Never Used  Substance Use Topics  . Alcohol use: Yes    Alcohol/week: 1.0 standard drinks    Types: 1 Glasses of wine per week    Comment: Rare  . Drug use: No     Allergies   Latex, Lisinopril, Penicillins, Promethazine, Shellfish allergy, Sulfa antibiotics, and Lamictal [lamotrigine]   Review of Systems Review of Systems  Constitutional: Positive for fever.  Gastrointestinal: Positive for abdominal pain and diarrhea.     Physical Exam Triage Vital Signs ED Triage Vitals  Enc Vitals Group     BP 09/04/18 1224 (!) 151/98     Pulse Rate 09/04/18 1224 63     Resp 09/04/18 1224 14     Temp 09/04/18 1224 98.2 F (36.8 C)     Temp Source 09/04/18 1224 Oral     SpO2 09/04/18 1224 97 %     Weight 09/04/18 1221 130 lb (59 kg)     Height 09/04/18 1221 5\' 2"  (1.575 m)     Head Circumference --      Peak Flow --      Pain Score 09/04/18 1220 6     Pain Loc --      Pain Edu? --      Excl. in [place]? --    No data found.  Updated Vital Signs BP (!) 151/98 (BP Location: Left Arm)   Pulse 63   Temp 98.2 F (36.8 C) (Oral)   Resp 14   Ht 5\' 2"  (1.575 m)   Wt 59 kg   SpO2 97%   BMI 23.78 kg/m   Visual Acuity Right Eye Distance:   Left Eye Distance:   Bilateral Distance:    Right Eye Near:   Left Eye Near:    Bilateral Near:     Physical Exam Vitals signs  and  nursing note reviewed.  Constitutional:      General: She is not in acute distress.    Appearance: She is not toxic-appearing or diaphoretic.  Cardiovascular:     Rate and Rhythm: Normal rate.  Pulmonary:     Effort: Pulmonary effort is normal. No respiratory distress.  Abdominal:     General: Bowel sounds are normal. There is no distension.     Palpations: Abdomen is soft. There is no mass.     Tenderness: There is abdominal tenderness (mild, diffuse tenderness to palpation; no rebound or guarding). There is no right CVA tenderness, left CVA tenderness, guarding or rebound.     Hernia: No hernia is present.  Skin:    Findings: No rash.  Neurological:     Mental Status: She is alert.      UC Treatments / Results  Labs (all labs ordered are listed, but only abnormal results are displayed) Labs Reviewed  NOVEL CORONAVIRUS, NAA (HOSPITAL ORDER, SEND-OUT TO REF LAB)    EKG   Radiology No results found.  Procedures Procedures (including critical care time)  Medications Ordered in UC Medications - No data to display  Initial Impression / Assessment and Plan / UC Course  I have reviewed the triage vital signs and the nursing notes.  Pertinent labs & imaging results that were available during my care of the patient were reviewed by me and considered in my medical decision making (see chart for details).      Final Clinical Impressions(s) / UC Diagnoses   Final diagnoses:  Viral gastroenteritis     Discharge Instructions     Clear liquids for the next 24 hours then advance diet slowly as tolerated Go to Emergency Department if symptoms worsen or become more localized    ED Prescriptions    Medication Sig Dispense Auth. Provider   ondansetron (ZOFRAN ODT) 8 MG disintegrating tablet Take 1 tablet (8 mg total) by mouth every 8 (eight) hours as needed. 6 tablet Norval Gable, MD      1. diagnosis reviewed with patient 2. rx as per orders above; reviewed  possible side effects, interactions, risks and benefits  3. Recommend supportive treatment as above 4. covid testing done 4. Follow-up prn if symptoms worsen or don't improve   Controlled Substance Prescriptions [place] Controlled Substance Registry consulted? Not Applicable   Norval Gable, MD 09/04/18 1308

## 2018-09-04 NOTE — Discharge Instructions (Signed)
Clear liquids for the next 24 hours then advance diet slowly as tolerated Go to Emergency Department if symptoms worsen or become more localized

## 2018-09-05 LAB — NOVEL CORONAVIRUS, NAA (HOSP ORDER, SEND-OUT TO REF LAB; TAT 18-24 HRS): SARS-CoV-2, NAA: NOT DETECTED

## 2018-09-07 ENCOUNTER — Encounter (HOSPITAL_COMMUNITY): Payer: Self-pay

## 2018-10-02 ENCOUNTER — Ambulatory Visit
Admission: RE | Admit: 2018-10-02 | Discharge: 2018-10-02 | Disposition: A | Discharge status: Home / Self Care | Payer: No Typology Code available for payment source | Source: Ambulatory Visit | Attending: Internal Medicine | Admitting: Internal Medicine

## 2018-10-02 ENCOUNTER — Other Ambulatory Visit: Payer: Self-pay

## 2018-10-02 DIAGNOSIS — Z1231 Encounter for screening mammogram for malignant neoplasm of breast: Secondary | ICD-10-CM | POA: Insufficient documentation

## 2018-10-30 ENCOUNTER — Other Ambulatory Visit: Payer: Self-pay

## 2018-10-30 DIAGNOSIS — Z20822 Contact with and (suspected) exposure to covid-19: Secondary | ICD-10-CM

## 2018-11-01 LAB — NOVEL CORONAVIRUS, NAA: SARS-CoV-2, NAA: NOT DETECTED

## 2018-11-02 ENCOUNTER — Telehealth: Payer: No Typology Code available for payment source | Admitting: Family

## 2018-11-02 DIAGNOSIS — J019 Acute sinusitis, unspecified: Secondary | ICD-10-CM

## 2018-11-02 MED ORDER — DOXYCYCLINE HYCLATE 100 MG PO TABS: 100.0000 mg | ORAL_TABLET | Freq: Two times a day (BID) | ORAL | 0 refills | Status: DC

## 2018-11-02 NOTE — Progress Notes (Signed)

## 2018-11-11 ENCOUNTER — Other Ambulatory Visit: Payer: Self-pay

## 2018-11-13 ENCOUNTER — Ambulatory Visit (INDEPENDENT_AMBULATORY_CARE_PROVIDER_SITE_OTHER): Payer: No Typology Code available for payment source | Admitting: Internal Medicine

## 2018-11-13 ENCOUNTER — Encounter: Payer: Self-pay | Admitting: Internal Medicine

## 2018-11-13 ENCOUNTER — Ambulatory Visit (INDEPENDENT_AMBULATORY_CARE_PROVIDER_SITE_OTHER): Payer: No Typology Code available for payment source

## 2018-11-13 ENCOUNTER — Other Ambulatory Visit: Payer: Self-pay

## 2018-11-13 VITALS — BP 142/90 | HR 57 | Temp 97.5°F | Ht 62.0 in | Wt 127.0 lb

## 2018-11-13 DIAGNOSIS — K582 Mixed irritable bowel syndrome: Secondary | ICD-10-CM

## 2018-11-13 DIAGNOSIS — R61 Generalized hyperhidrosis: Secondary | ICD-10-CM

## 2018-11-13 DIAGNOSIS — Z0000 Encounter for general adult medical examination without abnormal findings: Secondary | ICD-10-CM

## 2018-11-13 DIAGNOSIS — Z0001 Encounter for general adult medical examination with abnormal findings: Secondary | ICD-10-CM

## 2018-11-13 DIAGNOSIS — I10 Essential (primary) hypertension: Secondary | ICD-10-CM | POA: Diagnosis not present

## 2018-11-13 DIAGNOSIS — Z79899 Other long term (current) drug therapy: Secondary | ICD-10-CM

## 2018-11-13 LAB — CBC WITH DIFFERENTIAL/PLATELET
Basophils Absolute: 0 10*3/uL (ref 0.0–0.1)
Basophils Relative: 0.4 % (ref 0.0–3.0)
Eosinophils Absolute: 0 10*3/uL (ref 0.0–0.7)
Eosinophils Relative: 0.7 % (ref 0.0–5.0)
HCT: 38.5 % (ref 36.0–46.0)
Hemoglobin: 13.1 g/dL (ref 12.0–15.0)
Lymphocytes Relative: 34.4 % (ref 12.0–46.0)
Lymphs Abs: 2 10*3/uL (ref 0.7–4.0)
MCHC: 34 g/dL (ref 30.0–36.0)
MCV: 88.2 fl (ref 78.0–100.0)
Monocytes Absolute: 0.5 10*3/uL (ref 0.1–1.0)
Monocytes Relative: 8.5 % (ref 3.0–12.0)
Neutro Abs: 3.2 10*3/uL (ref 1.4–7.7)
Neutrophils Relative %: 56 % (ref 43.0–77.0)
Platelets: 274 10*3/uL (ref 150.0–400.0)
RBC: 4.36 Mil/uL (ref 3.87–5.11)
RDW: 12.6 % (ref 11.5–15.5)
WBC: 5.8 10*3/uL (ref 4.0–10.5)

## 2018-11-13 LAB — COMPREHENSIVE METABOLIC PANEL
ALT: 12 U/L (ref 0–35)
AST: 16 U/L (ref 0–37)
Albumin: 4.7 g/dL (ref 3.5–5.2)
Alkaline Phosphatase: 94 U/L (ref 39–117)
BUN: 10 mg/dL (ref 6–23)
CO2: 30 mEq/L (ref 19–32)
Calcium: 9.7 mg/dL (ref 8.4–10.5)
Chloride: 103 mEq/L (ref 96–112)
Creatinine, Ser: 0.73 mg/dL (ref 0.40–1.20)
GFR: 85.73 mL/min (ref >60.00)
Glucose, Bld: 95 mg/dL (ref 70–99)
Potassium: 3.8 mEq/L (ref 3.5–5.1)
Sodium: 142 mEq/L (ref 135–145)
Total Bilirubin: 0.3 mg/dL (ref 0.2–1.2)
Total Protein: 7.7 g/dL (ref 6.0–8.3)

## 2018-11-13 LAB — LIPID PANEL
Cholesterol: 212 mg/dL — ABNORMAL HIGH (ref 0–200)
HDL: 52.3 mg/dL (ref >39.00)
LDL Cholesterol: 132 mg/dL — ABNORMAL HIGH (ref 0–99)
NonHDL: 159.68
Total CHOL/HDL Ratio: 4
Triglycerides: 137 mg/dL (ref 0.0–149.0)
VLDL: 27.4 mg/dL (ref 0.0–40.0)

## 2018-11-13 LAB — MICROALBUMIN / CREATININE URINE RATIO
Creatinine,U: 53.7 mg/dL
Microalb Creat Ratio: 1.3 mg/g (ref 0.0–30.0)
Microalb, Ur: <0.7 mg/dL (ref 0.0–1.9)

## 2018-11-13 LAB — LUTEINIZING HORMONE: LH: 49.87 m[IU]/mL

## 2018-11-13 LAB — TSH: TSH: 0.78 u[IU]/mL (ref 0.35–4.50)

## 2018-11-13 LAB — FOLLICLE STIMULATING HORMONE: FSH: 88.5 m[IU]/mL

## 2018-11-13 MED ORDER — HYOSCYAMINE SULFATE 0.125 MG PO TBDP: 0.1250 mg | ORAL_TABLET | Freq: Four times a day (QID) | ORAL | 0 refills | Status: DC | PRN

## 2018-11-13 MED ORDER — ESTROGENS CONJUGATED 0.3 MG PO TABS: 0.3000 mg | ORAL_TABLET | Freq: Every day | ORAL | 1 refills | Status: DC

## 2018-11-13 MED ORDER — OMEPRAZOLE 20 MG PO CPDR: 20.0000 mg | DELAYED_RELEASE_CAPSULE | Freq: Every day | ORAL | 3 refills | Status: DC

## 2018-11-13 NOTE — Patient Instructions (Signed)
You can start the estrogen after the labs and x rays have been resulted to you   Health Maintenance, Female Adopting a healthy lifestyle and getting preventive care are important in promoting health and wellness. Ask your health care provider about:  The right schedule for you to have regular tests and exams.  Things you can do on your own to prevent diseases and keep yourself healthy. What should I know about diet, weight, and exercise? Eat a healthy diet   Eat a diet that includes plenty of vegetables, fruits, low-fat dairy products, and lean protein.  Do not eat a lot of foods that are high in solid fats, added sugars, or sodium. Maintain a healthy weight Body mass index (BMI) is used to identify weight problems. It estimates body fat based on height and weight. Your health care provider can help determine your BMI and help you achieve or maintain a healthy weight. Get regular exercise Get regular exercise. This is one of the most important things you can do for your health. Most adults should:  Exercise for at least 150 minutes each week. The exercise should increase your heart rate and make you sweat (moderate-intensity exercise).  Do strengthening exercises at least twice a week. This is in addition to the moderate-intensity exercise.  Spend less time sitting. Even light physical activity can be beneficial. Watch cholesterol and blood lipids Have your blood tested for lipids and cholesterol at 46 years of age, then have this test every 5 years. Have your cholesterol levels checked more often if:  Your lipid or cholesterol levels are high.  You are older than 46 years of age.  You are at high risk for heart disease. What should I know about cancer screening? Depending on your health history and family history, you may need to have cancer screening at various ages. This may include screening for:  Breast cancer.  Cervical cancer.  Colorectal cancer.  Skin cancer.   Lung cancer. What should I know about heart disease, diabetes, and high blood pressure? Blood pressure and heart disease  High blood pressure causes heart disease and increases the risk of stroke. This is more likely to develop in people who have high blood pressure readings, are of African descent, or are overweight.  Have your blood pressure checked: ? Every 3-5 years if you are 72-44 years of age. ? Every year if you are 46 years old or older. Diabetes Have regular diabetes screenings. This checks your fasting blood sugar level. Have the screening done:  Once every three years after age 79 if you are at a normal weight and have a low risk for diabetes.  More often and at a younger age if you are overweight or have a high risk for diabetes. What should I know about preventing infection? Hepatitis B If you have a higher risk for hepatitis B, you should be screened for this virus. Talk with your health care provider to find out if you are at risk for hepatitis B infection. Hepatitis C Testing is recommended for:  Everyone born from [birth year] through 1963.  Anyone with known risk factors for hepatitis C. Sexually transmitted infections (STIs)  Get screened for STIs, including gonorrhea and chlamydia, if: ? You are sexually active and are younger than 47 years of age. ? You are older than 46 years of age and your health care provider tells you that you are at risk for this type of infection. ? Your sexual activity has changed since you were  last screened, and you are at increased risk for chlamydia or gonorrhea. Ask your health care provider if you are at risk.  Ask your health care provider about whether you are at high risk for HIV. Your health care provider may recommend a prescription medicine to help prevent HIV infection. If you choose to take medicine to prevent HIV, you should first get tested for HIV. You should then be tested every 3 months for as long as you are taking the  medicine. Pregnancy  If you are about to stop having your period (premenopausal) and you may become pregnant, seek counseling before you get pregnant.  Take 400 to 800 micrograms (mcg) of folic acid every day if you become pregnant.  Ask for birth control (contraception) if you want to prevent pregnancy. Osteoporosis and menopause Osteoporosis is a disease in which the bones lose minerals and strength with aging. This can result in bone fractures. If you are 62 years old or older, or if you are at risk for osteoporosis and fractures, ask your health care provider if you should:  Be screened for bone loss.  Take a calcium or vitamin D supplement to lower your risk of fractures.  Be given hormone replacement therapy (HRT) to treat symptoms of menopause. Follow these instructions at home: Lifestyle  Do not use any products that contain nicotine or tobacco, such as cigarettes, e-cigarettes, and chewing tobacco. If you need help quitting, ask your health care provider.  Do not use street drugs.  Do not share needles.  Ask your health care provider for help if you need support or information about quitting drugs. Alcohol use  Do not drink alcohol if: ? Your health care provider tells you not to drink. ? You are pregnant, may be pregnant, or are planning to become pregnant.  If you drink alcohol: ? Limit how much you use to 0-1 drink a day. ? Limit intake if you are breastfeeding.  Be aware of how much alcohol is in your drink. In the [place] one drink equals one 12 oz bottle of beer (355 mL), one 5 oz glass of wine (148 mL), or one 1 oz glass of hard liquor (44 mL). General instructions  Schedule regular health, dental, and eye exams.  Stay current with your vaccines.  Tell your health care provider if: ? You often feel depressed. ? You have ever been abused or do not feel safe at home. Summary  Adopting a healthy lifestyle and getting preventive care are important in  promoting health and wellness.  Follow your health care provider's instructions about healthy diet, exercising, and getting tested or screened for diseases.  Follow your health care provider's instructions on monitoring your cholesterol and blood pressure. This information is not intended to replace advice given to you by your health care provider. Make sure you discuss any questions you have with your health care provider. Document Released: 07/16/2010 Document Revised: 12/24/2017 Document Reviewed: 12/24/2017 Elsevier Patient Education  2020 [employer].

## 2018-11-13 NOTE — Progress Notes (Addendum)
Patient ID: Connie Francis, female    DOB: 1970/07/21  Age: 48 y.o. MRN: [id number]  The patient is here for annual preventive  examination and management of other chronic and acute problems.   The risk factors are reflected in the social history.  The roster of all physicians providing medical care to patient - is listed in the Snapshot section of the chart.  Activities of daily living:  The patient is 100% independent in all ADLs: dressing, toileting, feeding as well as independent mobility  Home safety : The patient has smoke detectors in the home. They wear seatbelts.  There are no firearms at home. There is no violence in the home.   There is no risks for hepatitis, STDs or HIV. There is no   history of blood transfusion. They have no travel history to infectious disease endemic areas of the world.  The patient has seen their dentist in the last six month. They have seen their eye doctor in the last year. They admit to slight hearing difficulty with regard to whispered voices and some television programs.  They have deferred audiologic testing in the last year.  They do not  have excessive sun exposure. Discussed the need for sun protection: hats, long sleeves and use of sunscreen if there is significant sun exposure.   Diet: the importance of a healthy diet is discussed. They do have a healthy diet.  The benefits of regular aerobic exercise were discussed. She walks 4 times per week ,  20 minutes.   Depression screen: there are no signs or vegative symptoms of depression- irritability, change in appetite, anhedonia, sadness/tearfullness.  Cognitive assessment: the patient manages all their financial and personal affairs and is actively engaged. They could relate day,date,year and events; recalled 2/3 objects at 3 minutes; performed clock-face test normally.  The following portions of the patient's history were reviewed and updated as appropriate: allergies, current medications, past  family history, past medical history,  past surgical history, past social history  and problem list.  Visual acuity was not assessed per patient preference since she has regular follow up with her ophthalmologist. Hearing and body mass index were assessed and reviewed.   During the course of the visit the patient was educated and counseled about appropriate screening and preventive services including : fall prevention , diabetes screening, nutrition counseling, colorectal cancer screening, and recommended immunizations.    CC: There were no encounter diagnoses.  Recurrent episodes of abd cramping that radiates to left side of back , occurs after drinking cold drinks  , or eating .  Has a normal GB by June 2020 CT .  Has tried bentyl  In the past no prior trial of levsin .   Night sweats more frequent and occurring nightly  for the last few months. Some unintentional weight loss., but son just got married last week .  Denies stress about the wedding . She has been perimenopausal for the past 2 years. 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   She has a past surgical history that includes Dilation and curettage of uterus (2006); Cesarean section; Upper endoscopy w/ esophageal manometry; Tubal ligation; Esophagogastroduodenoscopy (egd) with propofol (N/A, 07/27/2014); Colonoscopy with propofol (N/A, 07/27/2014); colonscopy (07/27/2015); Vaginal hysterectomy; and Breast cyst  aspiration (Left).   Her family history includes Alcohol abuse in her maternal grandmother; Arthritis in her maternal grandfather; Cancer in her maternal aunt and paternal uncle; Esophageal cancer (age of onset: 78) in her cousin; Hearing loss in her maternal grandmother; Hypertension in her mother; Stroke in her paternal aunt.She reports that she has never smoked. She has never used smokeless tobacco. She reports current alcohol use of about 1.0 standard drinks of alcohol per week. She reports that she does not use drugs.  Outpatient Medications Prior to Visit  Medication Sig Dispense Refill  . ALPRAZolam (XANAX) 0.25 MG tablet Take 0.25 mg by mouth 2 (two) times daily as needed for anxiety.    Marland Kitchen amLODipine (NORVASC) 2.5 MG tablet Take 1 tablet (2.5 mg total) by mouth daily. 30 tablet 5  . citalopram (CELEXA) 10 MG tablet Take 10 mg by mouth daily.     Marland Kitchen doxycycline (VIBRA-TABS) 100 MG tablet Take 1 tablet (100 mg total) by mouth 2 (two) times daily. 20 tablet 0  . ondansetron (ZOFRAN ODT) 8 MG disintegrating tablet Take 1 tablet (8 mg total) by mouth every 8 (eight) hours as needed. 6 tablet 0  . polyethylene glycol (MIRALAX / GLYCOLAX) packet Take by mouth.    . terbinafine (LAMISIL) 250 MG tablet Take 1 tablet (250 mg total) by mouth daily. 90 tablet 0   No facility-administered medications prior to visit.     Review of Systems   Patient denies headache, fevers, malaise, , skin rash, eye pain, sinus congestion and sinus pain, sore throat, dysphagia,  hemoptysis , cough, dyspnea, wheezing, chest pain, palpitations, orthopnea, edema,nausea, melena, diarrhea, constipation,  dysuria, hematuria, urinary  Frequency, nocturia, numbness, tingling, seizures,  Focal weakness, Loss of consciousness,  Tremor, insomnia, depression, anxiety, and suicidal ideation.      Objective:  BP (!) 142/90   Pulse (!) 57   Temp (!) 97.5 F (36.4 C) (Temporal)   Ht 5' 2"  (1.575 m)   Wt 127 lb (57.6 kg)   SpO2  99%   BMI 23.23 kg/m   Physical Exam  General appearance: alert, cooperative and appears stated age Head: Normocephalic, without obvious abnormality, atraumatic Eyes: conjunctivae/corneas clear. PERRL, EOM's intact. Fundi benign. Ears: normal TM's and external ear canals both ears Nose: Nares normal. Septum midline. Mucosa normal. No drainage or sinus tenderness. Throat: lips, mucosa, and tongue normal; teeth and gums normal Neck: no adenopathy, no carotid bruit, no JVD, supple, symmetrical, trachea midline and thyroid not enlarged, symmetric, no tenderness/mass/nodules Lungs: clear to auscultation bilaterally Breasts: normal appearance, no masses or tenderness Heart: regular rate and rhythm, S1, S2 normal, no murmur, click, rub or gallop Abdomen: soft, non-tender; bowel sounds normal; no masses,  no organomegaly Extremities: extremities normal, atraumatic, no cyanosis or edema Pulses: 2+ and symmetric Skin: Skin color, texture, turgor normal. No rashes or lesions Neurologic: Alert and oriented X 3, normal strength and tone. Normal symmetric reflexes. Normal coordination and gait.     Assessment & Plan:   Problem List Items Addressed This Visit    None      I am having Lynae T. Cratty maintain her ALPRAZolam, polyethylene glycol, amLODipine, citalopram, terbinafine, ondansetron, and doxycycline.  No orders of the  defined types were placed in this encounter.   There are no discontinued medications.  Follow-up: No follow-ups on file.   Crecencio Mc, MD

## 2018-11-15 DIAGNOSIS — Z780 Asymptomatic menopausal state: Secondary | ICD-10-CM | POA: Insufficient documentation

## 2018-11-15 DIAGNOSIS — R61 Generalized hyperhidrosis: Secondary | ICD-10-CM | POA: Insufficient documentation

## 2018-11-15 NOTE — Assessment & Plan Note (Signed)
Trial of hyoscyamine recommended given current abdominal pain complaints.

## 2018-11-15 NOTE — Assessment & Plan Note (Signed)
Secondary to menopasue, given FHS/L a,  Normal chest xray and exam.

## 2018-11-15 NOTE — Assessment & Plan Note (Signed)

## 2019-02-04 ENCOUNTER — Encounter: Payer: Self-pay | Admitting: Internal Medicine

## 2019-02-04 ENCOUNTER — Ambulatory Visit (INDEPENDENT_AMBULATORY_CARE_PROVIDER_SITE_OTHER): Payer: No Typology Code available for payment source | Admitting: Internal Medicine

## 2019-02-04 DIAGNOSIS — I10 Essential (primary) hypertension: Secondary | ICD-10-CM | POA: Diagnosis not present

## 2019-02-04 DIAGNOSIS — R1011 Right upper quadrant pain: Secondary | ICD-10-CM

## 2019-02-04 DIAGNOSIS — K5909 Other constipation: Secondary | ICD-10-CM | POA: Diagnosis not present

## 2019-02-04 DIAGNOSIS — R61 Generalized hyperhidrosis: Secondary | ICD-10-CM | POA: Diagnosis not present

## 2019-02-04 NOTE — Assessment & Plan Note (Signed)
Reading elevated today but has not been checking regularly.  Advised to check  Once daily x 1 week and report readings in one weke

## 2019-02-04 NOTE — Assessment & Plan Note (Signed)
Improved frequency with use of Premarin.  Maternal aunt diagnosed with BRCA recently, discussed that this is not a C/I for her . Her last mammogram was Sept 2020 and normal

## 2019-02-04 NOTE — Assessment & Plan Note (Signed)
Continue miralax once daily,  Increase water intake

## 2019-02-04 NOTE — Patient Instructions (Addendum)
Stop the probiotic for now  Continue miralax and increase water intake to 60 ounces.  Goal is a BM daily   Add mylanta Gas,  phasyme or Gas X  As needed for gas   Continue checking your blood pressure once daily,  And send me readings when you follow up in one week via mychart

## 2019-02-04 NOTE — Assessment & Plan Note (Signed)
Present for 2-3 weeks,  Pain is not sharp but dull and spreads to xiphoid , chest and left shoulder as a warm feeling  No nausea,  Dyspnea or acutla chest pain. Reassurance provided,  CT from June reviewed: no gallstones or pancreatic cyst.  Likely due to gas and IBS> trial of phasyme,

## 2019-02-04 NOTE — Progress Notes (Signed)
Virtual Visit via [email or web address]  This visit type was conducted due to national recommendations for restrictions regarding the COVID-19 pandemic (e.g. social distancing).  This format is felt to be most appropriate for this patient at this time.  All issues noted in this document were discussed and addressed.  No physical exam was performed (except for noted visual exam findings with Video Visits).   I connected with@ on 02/04/19 at 10:30 AM EST by a video enabled telemedicine application or telephone and verified that I am speaking with the correct person using two identifiers. Location patient: home Location provider: work or home office Persons participating in the virtual visit: patient, provider  I discussed the limitations, risks, security and privacy concerns of performing an evaluation and management service by telephone and the availability of in person appointments. I also discussed with the patient that there may be a patient responsible charge related to this service. The patient expressed understanding and agreed to proceed.  Reason for visit: abd pain   HPI:  49 yr old female with post partum cardiomyopathy , IBD  With chronic epigastric pain /GERD and bloating.  GAD with OCD tendencies presents with new onset RUQ pain that radiates to chest and left side of back.  History of bloating, reports increased gassiness .  Taking miralax and probiotic daily,  Along with mealtime Beano ,  But no gas x, , bowels moving every 203 days and alternate between solid, liquid and small caliber "rocks"  Menopause:  Night sweats imporved on premarin but worried about C/I. Maternal aunt with breast CA, mother oK no history   HTN:  Taking amlodipine  Has not checked B in a while  Blood pressure elevated today 152/93 ROS: See pertinent positives and negatives per HPI.  Past Medical History:  Diagnosis Date  . Abdominal bloating   . Anxiety   . 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 CHF  (congestive heart failure) ([place]) 2006  . Chronic epigastric pain   . Constipation   . GERD (gastroesophageal reflux disease)   . Hiatal hernia 2017  . Hypertension   . IBS (irritable bowel syndrome)   . Kidney stones   . Lump or mass in breast    R-Breast  . Migraine   . Obsessive compulsive disorder   . Thyroid disease    hyper    Past Surgical History:  Procedure Laterality Date  . BREAST CYST ASPIRATION Left    negative 2010  . CESAREAN SECTION    . COLONOSCOPY WITH PROPOFOL N/A 07/27/2014   Procedure: COLONOSCOPY WITH PROPOFOL;  Surgeon: Lucilla Lame, MD;  Location: [facility];  Service: Endoscopy;  Laterality: N/A;  . colonscopy  07/27/2015  . DILATION AND CURETTAGE OF UTERUS  6720   complicated by heart failure  . ESOPHAGOGASTRODUODENOSCOPY (EGD) WITH PROPOFOL N/A 07/27/2014   Procedure: ESOPHAGOGASTRODUODENOSCOPY (EGD) WITH PROPOFOL;  Surgeon: Lucilla Lame, MD;  Location: [facility];  Service: Endoscopy;  Laterality: N/A;  . TUBAL LIGATION    . UPPER ENDOSCOPY W/ ESOPHAGEAL MANOMETRY    . VAGINAL HYSTERECTOMY      Family History  Problem Relation Age of Onset  . Cancer Maternal Aunt        parotid Ca  . Hearing loss Maternal Grandmother   . Alcohol abuse Maternal Grandmother   . Hypertension Mother   . Stroke Paternal Aunt   . Cancer Paternal Uncle        lung, nasopharnygeal  . Arthritis Maternal Grandfather   .  Esophageal cancer Cousin 36  . Colon cancer Neg Hx   . Liver disease Neg Hx   . Breast cancer Neg Hx   . Ovarian cancer Neg Hx   . Diabetes Neg Hx     SOCIAL HX:  reports that she has never smoked. She has never used smokeless tobacco. She reports current alcohol use of about 1.0 standard drinks of alcohol per week. She reports that she does not use drugs.   Current Outpatient Medications:  .  ALPRAZolam (XANAX) 0.25 MG tablet, Take 0.25 mg by mouth 2 (two) times daily as needed for anxiety., Disp: , Rfl:  .  amLODipine (NORVASC)  2.5 MG tablet, Take 1 tablet (2.5 mg total) by mouth daily., Disp: 30 tablet, Rfl: 5 .  citalopram (CELEXA) 10 MG tablet, Take 10 mg by mouth daily. , Disp: , Rfl:  .  estrogens, conjugated, (PREMARIN) 0.3 MG tablet, Take 1 tablet (0.3 mg total) by mouth daily., Disp: 90 tablet, Rfl: 1 .  hyoscyamine (ANASPAZ) 0.125 MG TBDP disintergrating tablet, Place 1 tablet (0.125 mg total) under the tongue every 6 (six) hours as needed. For abdominal /esophageal pain, Disp: 30 tablet, Rfl: 0 .  omeprazole (PRILOSEC) 20 MG capsule, Take 1 capsule (20 mg total) by mouth daily., Disp: 30 capsule, Rfl: 3 .  ondansetron (ZOFRAN ODT) 8 MG disintegrating tablet, Take 1 tablet (8 mg total) by mouth every 8 (eight) hours as needed., Disp: 6 tablet, Rfl: 0 .  polyethylene glycol (MIRALAX / GLYCOLAX) packet, Take by mouth., Disp: , Rfl:  .  doxycycline (VIBRA-TABS) 100 MG tablet, Take 1 tablet (100 mg total) by mouth 2 (two) times daily., Disp: 20 tablet, Rfl: 0  EXAM:  VITALS per patient if applicable:  GENERAL: alert, oriented, appears well and in no acute distress  HEENT: atraumatic, conjunttiva clear, no obvious abnormalities on inspection of external nose and ears  NECK: normal movements of the head and neck  LUNGS: on inspection no signs of respiratory distress, breathing rate appears normal, no obvious gross SOB, gasping or wheezing  CV: no obvious cyanosis  MS: moves all visible extremities without noticeable abnormality  PSYCH/NEURO: pleasant and cooperative, no obvious depression or anxiety, speech and thought processing grossly intact  ASSESSMENT AND PLAN:  Discussed the following assessment and plan:  Chronic constipation  Night sweats  Colicky RUQ abdominal pain  Essential hypertension  Chronic constipation Continue miralax once daily,  Increase water intake   Night sweats Improved frequency with use of Premarin.  Maternal aunt diagnosed with BRCA recently, discussed that this is  not a C/I for her . Her last mammogram was Sept 1601 and normal  Colicky RUQ abdominal pain Present for 2-3 weeks,  Pain is not sharp but dull and spreads to xiphoid , chest and left shoulder as a warm feeling  No nausea,  Dyspnea or acutla chest pain. Reassurance provided,  CT from June reviewed: no gallstones or pancreatic cyst.  Likely due to gas and IBS> trial of phasyme,  Essential hypertension Reading elevated today but has not been checking regularly.  Advised to check  Once daily x 1 week and report readings in one weke     I discussed the assessment and treatment plan with the patient. The patient was provided an opportunity to ask questions and all were answered. The patient agreed with the plan and demonstrated an understanding of the instructions.   The patient was advised to call back or seek an in-person evaluation if the  symptoms worsen or if the condition fails to improve as anticipated.  I provided  30 minutes of non-face-to-face time during this encounter reviewing patient's current problems and past procedures/imaging studies, providing counseling on the above mentioned problems , and coordination  of care . Crecencio Mc, MD

## 2019-02-12 ENCOUNTER — Encounter: Payer: Self-pay | Admitting: Internal Medicine

## 2019-02-15 ENCOUNTER — Other Ambulatory Visit: Payer: Self-pay | Admitting: Internal Medicine

## 2019-02-15 DIAGNOSIS — R1013 Epigastric pain: Secondary | ICD-10-CM

## 2019-02-16 ENCOUNTER — Other Ambulatory Visit: Payer: Self-pay

## 2019-02-16 ENCOUNTER — Other Ambulatory Visit (INDEPENDENT_AMBULATORY_CARE_PROVIDER_SITE_OTHER): Payer: No Typology Code available for payment source

## 2019-02-16 DIAGNOSIS — R1013 Epigastric pain: Secondary | ICD-10-CM | POA: Diagnosis not present

## 2019-02-17 LAB — COMPREHENSIVE METABOLIC PANEL
ALT: 11 U/L (ref 0–35)
AST: 14 U/L (ref 0–37)
Albumin: 4.5 g/dL (ref 3.5–5.2)
Alkaline Phosphatase: 97 U/L (ref 39–117)
BUN: 11 mg/dL (ref 6–23)
CO2: 31 mEq/L (ref 19–32)
Calcium: 10 mg/dL (ref 8.4–10.5)
Chloride: 103 mEq/L (ref 96–112)
Creatinine, Ser: 0.74 mg/dL (ref 0.40–1.20)
GFR: 84.3 mL/min (ref >60.00)
Glucose, Bld: 90 mg/dL (ref 70–99)
Potassium: 3.7 mEq/L (ref 3.5–5.1)
Sodium: 141 mEq/L (ref 135–145)
Total Bilirubin: 0.3 mg/dL (ref 0.2–1.2)
Total Protein: 7.2 g/dL (ref 6.0–8.3)

## 2019-02-17 LAB — LIPASE: Lipase: 25 U/L (ref 11.0–59.0)

## 2019-02-19 LAB — SPECIMEN STATUS REPORT

## 2019-02-19 LAB — HELICOBACTER PYLORI ABS-IGG+IGA, BLD
H. pylori, IgA Abs: <9 units (ref 0.0–8.9)
H. pylori, IgG AbS: 0.21 Index Value (ref 0.00–0.79)

## 2019-02-24 ENCOUNTER — Encounter: Payer: Self-pay | Admitting: *Deleted

## 2019-02-24 ENCOUNTER — Emergency Department
Admission: EM | Admit: 2019-02-24 | Discharge: 2019-02-24 | Disposition: A | Discharge status: Home / Self Care | Payer: No Typology Code available for payment source | Attending: Emergency Medicine | Admitting: Emergency Medicine

## 2019-02-24 ENCOUNTER — Emergency Department: Payer: No Typology Code available for payment source

## 2019-02-24 ENCOUNTER — Other Ambulatory Visit: Payer: Self-pay

## 2019-02-24 DIAGNOSIS — R1031 Right lower quadrant pain: Secondary | ICD-10-CM | POA: Insufficient documentation

## 2019-02-24 DIAGNOSIS — I110 Hypertensive heart disease with heart failure: Secondary | ICD-10-CM | POA: Insufficient documentation

## 2019-02-24 DIAGNOSIS — I509 Heart failure, unspecified: Secondary | ICD-10-CM | POA: Diagnosis not present

## 2019-02-24 DIAGNOSIS — Z79899 Other long term (current) drug therapy: Secondary | ICD-10-CM | POA: Insufficient documentation

## 2019-02-24 DIAGNOSIS — Z91040 Latex allergy status: Secondary | ICD-10-CM | POA: Diagnosis not present

## 2019-02-24 LAB — URINALYSIS, COMPLETE (UACMP) WITH MICROSCOPIC
Bilirubin Urine: NEGATIVE
Glucose, UA: NEGATIVE mg/dL
Ketones, ur: NEGATIVE mg/dL
Leukocytes,Ua: NEGATIVE
Nitrite: NEGATIVE
Protein, ur: NEGATIVE mg/dL
Specific Gravity, Urine: 1.014 (ref 1.005–1.030)
Squamous Epithelial / HPF: NONE SEEN (ref 0–5)
pH: 6 (ref 5.0–8.0)

## 2019-02-24 LAB — COMPREHENSIVE METABOLIC PANEL
ALT: 19 U/L (ref 0–44)
AST: 22 U/L (ref 15–41)
Albumin: 4.3 g/dL (ref 3.5–5.0)
Alkaline Phosphatase: 93 U/L (ref 38–126)
Anion gap: 10 (ref 5–15)
BUN: 16 mg/dL (ref 6–20)
CO2: 29 mmol/L (ref 22–32)
Calcium: 9.4 mg/dL (ref 8.9–10.3)
Chloride: 102 mmol/L (ref 98–111)
Creatinine, Ser: 0.71 mg/dL (ref 0.44–1.00)
GFR calc Af Amer: >60 mL/min (ref >60)
GFR calc non Af Amer: >60 mL/min (ref >60)
Glucose, Bld: 104 mg/dL — ABNORMAL HIGH (ref 70–99)
Potassium: 3.3 mmol/L — ABNORMAL LOW (ref 3.5–5.1)
Sodium: 141 mmol/L (ref 135–145)
Total Bilirubin: 0.4 mg/dL (ref 0.3–1.2)
Total Protein: 7.9 g/dL (ref 6.5–8.1)

## 2019-02-24 LAB — CBC
HCT: 39 % (ref 36.0–46.0)
Hemoglobin: 12.9 g/dL (ref 12.0–15.0)
MCH: 29.7 pg (ref 26.0–34.0)
MCHC: 33.1 g/dL (ref 30.0–36.0)
MCV: 89.9 fL (ref 80.0–100.0)
Platelets: 260 10*3/uL (ref 150–400)
RBC: 4.34 MIL/uL (ref 3.87–5.11)
RDW: 11.9 % (ref 11.5–15.5)
WBC: 7.2 10*3/uL (ref 4.0–10.5)
nRBC: 0 % (ref 0.0–0.2)

## 2019-02-24 LAB — TROPONIN I (HIGH SENSITIVITY): Troponin I (High Sensitivity): 3 ng/L (ref <18)

## 2019-02-24 LAB — LIPASE, BLOOD: Lipase: 32 U/L (ref 11–51)

## 2019-02-24 MED ORDER — IOHEXOL 300 MG/ML  SOLN
100.0000 mL | Freq: Once | INTRAMUSCULAR | Status: AC | PRN
  Administered 2019-02-24: 100 mL via INTRAVENOUS

## 2019-02-24 MED ORDER — ONDANSETRON HCL 4 MG/2ML IJ SOLN
4.0000 mg | Freq: Once | INTRAMUSCULAR | Status: AC
  Administered 2019-02-24: 4 mg via INTRAVENOUS
  Filled 2019-02-24: qty 2

## 2019-02-24 MED ORDER — KETOROLAC TROMETHAMINE 30 MG/ML IJ SOLN
15.0000 mg | Freq: Once | INTRAMUSCULAR | Status: AC
  Administered 2019-02-24: 06:00:00 15 mg via INTRAVENOUS
  Filled 2019-02-24: qty 1

## 2019-02-24 NOTE — ED Triage Notes (Addendum)
Pt to ED reporting sudden onset of right sided abd pain that woke her from her sleep. Pt woke in a sweat and reports pain has not subsided since. No tenderness upon palpation. Pt reports she had a BM and it did not help to relieve the pain at all. No dizziness, SOB, CP or fevers  Hx of Kidney stones and denies this feeling similar.  Nausea reported when pain started but no nausea at this time. Pt reports "I just don't feel right"

## 2019-02-24 NOTE — Discharge Instructions (Addendum)

## 2019-02-24 NOTE — ED Provider Notes (Signed)
[facility] Emergency Department Provider Note  ____________________________________________  Time seen: Approximately 5:23 AM  I have reviewed the triage vital signs and the nursing notes.   HISTORY  Chief Complaint Abdominal Pain   HPI Connie Francis is a 49 y.o. female with a history of IBS, constipation, GERD, kidney stones who presents for evaluation of right-sided abdominal pain.  Patient reports the pain woke her up from her sleep in the middle of the night.  The pain was sharp, severe, in waves, located in the right side of her abdomen and radiating to her right flank.  She had nausea but no vomiting, no dysuria or hematuria.  Last bowel movement was 3 days ago which is normal for her.  No fever or chills.  Pain similar to prior episode of kidney stones.  She reports that at this time the pain is mostly resolved but she still has some mild soreness on the right side of her abdomen.  She has had a hysterectomy but no other abdominal surgeries.   Past Medical History:  Diagnosis Date  . Abdominal bloating   . Anxiety   . 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 CHF (congestive heart failure) ([place]) 2006  . Chronic epigastric pain   . Constipation   . GERD (gastroesophageal reflux disease)   . Hiatal hernia 2017  . Hypertension   . IBS (irritable bowel syndrome)   . Kidney stones   . Lump or mass in breast    R-Breast  . Migraine   . Obsessive compulsive disorder   . Thyroid disease    hyper    Patient Active Problem List   Diagnosis Date Noted  . Colicky RUQ abdominal pain 02/04/2019  . Night sweats 11/15/2018  . Onychomycosis of left great toe 08/13/2018  . Long-term use of high-risk medication 08/13/2018  . RLQ abdominal pain 06/09/2018  . Right-sided back pain 10/05/2017  . Microscopic hematuria 10/05/2017  . Chronic constipation 12/17/2016  . Endometriosis 12/17/2016  . Recurrent headache 01/06/2016  . IBS (irritable  bowel syndrome) 07/17/2014  . Status post [facility] 07/15/2014  . Cyst (solitary) of breast 07/12/2013  . Edema 01/18/2013  . Essential hypertension 01/18/2013  . Psychogenic dyspepsia 11/27/2012  . Visit for preventive health examination 06/18/2012  . Acquired cyst of kidney 02/18/2012  . H/O urinary stone 02/18/2012  . Depression with anxiety 01/07/2012  . Adaptive colitis 12/06/2011  . Obsessive compulsive disorder   . 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 BREAST CYST ASPIRATION Left    negative 2010  . CESAREAN SECTION    . COLONOSCOPY WITH PROPOFOL N/A 07/27/2014   Procedure: COLONOSCOPY WITH PROPOFOL;  Surgeon: Lucilla Lame, MD;  Location: [place] [facility];  Service: Endoscopy;  Laterality: N/A;  . colonscopy  07/27/2015  . DILATION AND CURETTAGE OF UTERUS  123456   complicated by heart failure  . ESOPHAGOGASTRODUODENOSCOPY (EGD) WITH PROPOFOL N/A 07/27/2014   Procedure: ESOPHAGOGASTRODUODENOSCOPY (EGD) WITH PROPOFOL;  Surgeon: Lucilla Lame, MD;  Location: [place];  Service: Endoscopy;  Laterality: N/A;  . TUBAL LIGATION    . UPPER ENDOSCOPY W/ ESOPHAGEAL MANOMETRY    . VAGINAL HYSTERECTOMY      Prior to Admission medications   Medication Sig Start Date End Date Taking? Authorizing Provider  ALPRAZolam (XANAX) 0.25 MG tablet Take 0.25 mg by mouth 2 (two) times daily as needed for anxiety.    [provider]  amLODipine (  NORVASC) 2.5 MG tablet Take 1 tablet (2.5 mg total) by mouth daily. 11/14/17   Crecencio Mc, MD  citalopram (CELEXA) 10 MG tablet Take 10 mg by mouth daily.  03/20/18   [provider]  doxycycline (VIBRA-TABS) 100 MG tablet Take 1 tablet (100 mg total) by mouth 2 (two) times daily. 11/02/18   Sharion Balloon, FNP  estrogens, conjugated, (PREMARIN) 0.3 MG tablet Take 1 tablet (0.3 mg total) by mouth daily. 11/13/18   Crecencio Mc, MD  hyoscyamine (ANASPAZ) 0.125 MG TBDP  disintergrating tablet Place 1 tablet (0.125 mg total) under the tongue every 6 (six) hours as needed. For abdominal /esophageal pain 11/13/18   Crecencio Mc, MD  omeprazole (PRILOSEC) 20 MG capsule Take 1 capsule (20 mg total) by mouth daily. 11/13/18   Crecencio Mc, MD  ondansetron (ZOFRAN ODT) 8 MG disintegrating tablet Take 1 tablet (8 mg total) by mouth every 8 (eight) hours as needed. 09/04/18   Norval Gable, MD  polyethylene glycol [facility] / GLYCOLAX) packet Take by mouth. 08/28/16   [provider]    Allergies Latex, Lisinopril, Penicillins, Promethazine, Shellfish allergy, Sulfa antibiotics, and Lamictal [lamotrigine]  Family History  Problem Relation Age of Onset  . Cancer Maternal Aunt        parotid Ca  . Hearing loss Maternal Grandmother   . Alcohol abuse Maternal Grandmother   . Hypertension Mother   . Stroke Paternal Aunt   . Cancer Paternal Uncle        lung, nasopharnygeal  . Arthritis Maternal Grandfather   . Esophageal cancer Cousin 7  . Colon cancer Neg Hx   . Liver disease Neg Hx   . Breast cancer Neg Hx   . Ovarian cancer Neg Hx   . Diabetes Neg Hx     Social History Social History   Tobacco Use  . Smoking status: Never Smoker  . Smokeless tobacco: Never Used  Substance Use Topics  . Alcohol use: Yes    Alcohol/week: 1.0 standard drinks    Types: 1 Glasses of wine per week    Comment: Rare  . Drug use: No    Review of Systems  Constitutional: Negative for fever. Eyes: Negative for visual changes. ENT: Negative for sore throat. Neck: No neck pain  Cardiovascular: Negative for chest pain. Respiratory: Negative for shortness of breath. Gastrointestinal: + Right sided abdominal pain and nausea. No vomiting or diarrhea. Genitourinary: Negative for dysuria. Musculoskeletal: Negative for back pain. Skin: Negative for rash. Neurological: Negative for headaches, weakness or numbness. Psych: No SI or  HI  ____________________________________________   PHYSICAL EXAM:  VITAL SIGNS: ED Triage Vitals  Enc Vitals Group     BP 02/24/19 0309 (!) 182/103     Pulse Rate 02/24/19 0309 (!) 102     Resp 02/24/19 0309 16     Temp 02/24/19 0309 98.2 F (36.8 C)     Temp Source 02/24/19 0309 Oral     SpO2 02/24/19 0309 98 %     Weight 02/24/19 0310 127 lb (57.6 kg)     Height 02/24/19 0310 5\' 2"  (1.575 m)     Head Circumference --      Peak Flow --      Pain Score 02/24/19 0310 8     Pain Loc --      Pain Edu? --      Excl. in [place]? --     Constitutional: Alert and oriented. Well appearing and in  no apparent distress. HEENT:      Head: Normocephalic and atraumatic.         Eyes: Conjunctivae are normal. Sclera is non-icteric.       Mouth/Throat: Mucous membranes are moist.       Neck: Supple with no signs of meningismus. Cardiovascular: Regular rate and rhythm. No murmurs, gallops, or rubs. 2+ symmetrical distal pulses are present in all extremities. No JVD. Respiratory: Normal respiratory effort. Lungs are clear to auscultation bilaterally. No wheezes, crackles, or rhonchi.  Gastrointestinal: Soft, mild RLQ tenderness to palpation, and non distended with positive bowel sounds. No rebound or guarding. Genitourinary: No CVA tenderness. Musculoskeletal: Nontender with normal range of motion in all extremities. No edema, cyanosis, or erythema of extremities. Neurologic: Normal speech and language. Face is symmetric. Moving all extremities. No gross focal neurologic deficits are appreciated. Skin: Skin is warm, dry and intact. No rash noted. Psychiatric: Mood and affect are normal. Speech and behavior are normal.  ____________________________________________   LABS (all labs ordered are listed, but only abnormal results are displayed)  Labs Reviewed  COMPREHENSIVE METABOLIC PANEL - Abnormal; Notable for the following components:      Result Value   Potassium 3.3 (*)    Glucose, Bld  104 (*)    All other components within normal limits  URINALYSIS, COMPLETE (UACMP) WITH MICROSCOPIC - Abnormal; Notable for the following components:   Color, Urine YELLOW (*)    APPearance CLEAR (*)    Hgb urine dipstick MODERATE (*)    Bacteria, UA RARE (*)    All other components within normal limits  LIPASE, BLOOD  CBC  TROPONIN I (HIGH SENSITIVITY)  TROPONIN I (HIGH SENSITIVITY)   ____________________________________________  EKG  ED ECG REPORT I, Rudene Re, the attending physician, personally viewed and interpreted this ECG.  Normal sinus rhythm, rate of 82, normal intervals, normal axis, no ST elevations or depressions.  Normal EKG. ____________________________________________  RADIOLOGY  I have personally reviewed the images performed during this visit and I agree with the Radiologist's read.   Interpretation by Radiologist:  CT ABDOMEN PELVIS W CONTRAST  Result Date: 02/24/2019 CLINICAL DATA:  Sudden onset of right-sided abdominal pain EXAM: CT ABDOMEN AND PELVIS WITH CONTRAST TECHNIQUE: Multidetector CT imaging of the abdomen and pelvis was performed using the standard protocol following bolus administration of intravenous contrast. CONTRAST:  121mL OMNIPAQUE IOHEXOL 300 MG/ML  SOLN COMPARISON:  06/25/2018 FINDINGS: Lower chest:  No contributory findings. Hepatobiliary: No focal liver abnormality.No evidence of biliary obstruction or stone. Pancreas: Unremarkable. Spleen: Unremarkable. Adrenals/Urinary Tract: Negative adrenals. No hydronephrosis or stone. Small focus of scarring at the upper pole left kidney. Unremarkable bladder. Stomach/Bowel:  No obstruction. No appendicitis. Vascular/Lymphatic: No acute vascular abnormality. No mass or adenopathy. Reproductive:Hysterectomy. Other: No ascites or pneumoperitoneum. Musculoskeletal: No acute abnormalities. Mild dextrocurvature of the lumbar spine. IMPRESSION: No acute finding or explanation for abdominal pain.  Electronically Signed   By: Monte Fantasia M.D.   On: 02/24/2019 06:15      ____________________________________________   PROCEDURES  Procedure(s) performed: None Procedures Critical Care performed:  None ____________________________________________   INITIAL IMPRESSION / ASSESSMENT AND PLAN / ED COURSE  49 y.o. female with a history of IBS, constipation, GERD, kidney stones who presents for evaluation of sudden onset, severe, sharp right-sided abdominal pain associated with nausea which has significantly improved now.  Patient is well-appearing and in no distress with normal vital signs, abdomen is flat, soft, with mild right lower quadrant tenderness, no rebound  or guarding, positive bowel sounds.  Labs showing no leukocytosis, normal LFTs, normal lipase, UA with small amount of blood but no signs of UTI.  Differential diagnoses including kidney stones versus cholelithiasis versus cholecystitis versus appendicitis versus UTI versus diverticulitis versus IBS flare.  We will give Toradol and Zofran for symptom relief.  Will get a CT of the abdomen.     _________________________ 6:32 AM on 02/24/2019 -----------------------------------------  CT negative for any acute findings.  Patient reports that her pain is fully resolved.  Most likely a passed kidney stone.  Will discharge home on supportive care follow-up with PCP.  Discussed my standard return precautions.    As part of my medical decision making, I reviewed the following data within the [facility] notes reviewed and incorporated, Labs reviewed , EKG interpreted , Old chart reviewed, Radiograph reviewed , Notes from prior ED visits and [place] Controlled Substance Database   Please note:  Patient was evaluated in Emergency Department today for the symptoms described in the history of present illness. Patient was evaluated in the context of the global COVID-19 pandemic, which necessitated  consideration that the patient might be at risk for infection with the SARS-CoV-2 virus that causes COVID-19. Institutional protocols and algorithms that pertain to the evaluation of patients at risk for COVID-19 are in a state of rapid change based on information released by regulatory bodies including the CDC and federal and state organizations. These policies and algorithms were followed during the patient's care in the ED.  Some ED evaluations and interventions may be delayed as a result of limited staffing during the pandemic.   ____________________________________________   FINAL CLINICAL IMPRESSION(S) / ED DIAGNOSES   Final diagnoses:  Right lower quadrant abdominal pain      NEW MEDICATIONS STARTED DURING THIS VISIT:  ED Discharge Orders    None       Note:  This document was prepared using Dragon voice recognition software and may include unintentional dictation errors.    Alfred Levins, [place], MD 02/24/19 [phone]

## 2019-04-21 ENCOUNTER — Ambulatory Visit: Payer: No Typology Code available for payment source | Admitting: Urology

## 2019-05-26 ENCOUNTER — Encounter: Payer: Self-pay | Admitting: Urology

## 2019-05-26 ENCOUNTER — Ambulatory Visit (INDEPENDENT_AMBULATORY_CARE_PROVIDER_SITE_OTHER): Payer: No Typology Code available for payment source | Admitting: Urology

## 2019-05-26 ENCOUNTER — Other Ambulatory Visit: Payer: Self-pay

## 2019-05-26 VITALS — BP 160/90 | HR 71 | Ht 62.0 in | Wt 127.0 lb

## 2019-05-26 DIAGNOSIS — R3129 Other microscopic hematuria: Secondary | ICD-10-CM | POA: Diagnosis not present

## 2019-05-26 LAB — URINALYSIS, COMPLETE
Bilirubin, UA: NEGATIVE
Glucose, UA: NEGATIVE
Ketones, UA: NEGATIVE
Leukocytes,UA: NEGATIVE
Nitrite, UA: NEGATIVE
Protein,UA: NEGATIVE
Specific Gravity, UA: 1.015 (ref 1.005–1.030)
Urobilinogen, Ur: 0.2 mg/dL (ref 0.2–1.0)
pH, UA: 7 (ref 5.0–7.5)

## 2019-05-26 LAB — MICROSCOPIC EXAMINATION
Bacteria, UA: NONE SEEN
WBC, UA: NONE SEEN /hpf (ref 0–5)

## 2019-05-26 NOTE — Progress Notes (Signed)
05/26/19 11:41 AM   Kaisey Jolee Ewing 16-May-1970 [id number]  Referring provider: Crecencio Mc, MD [address] [place],  [place] [address] Chief Complaint  Patient presents with  . Hematuria    HPI: Connie Francis is a 49 y.o. white female who presents today for the evaluation and management of hematuria.  -Episode right lower quandrant of abdominal pain in 02/2019 associated with microhematuria -CT abdomen pelvis with contrast showed no GU abnormalities -Past history stone disease -Long history microhematuria with prior cystoscopy by Dr. Jacqlyn Larsen -UA 02/2019 with 4-10 WBCs -Several UAs in chart with positive blood on dipstick but no significant RBCs on microscopy -Presently asymptomatic -Denies gross hematuria   PMH: Past Medical History:  Diagnosis Date  . Abdominal bloating   . Anxiety   . 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 CHF (congestive heart failure) ([place]) 2006  . Chronic epigastric pain   . Constipation   . GERD (gastroesophageal reflux disease)   . Hiatal hernia 2017  . Hypertension   . IBS (irritable bowel syndrome)   . Kidney stones   . Lump or mass in breast    R-Breast  . Migraine   . Obsessive compulsive disorder   . Thyroid disease    hyper    Surgical History: Past Surgical History:  Procedure Laterality Date  . BREAST CYST ASPIRATION Left    negative 2010  . CESAREAN SECTION    . COLONOSCOPY WITH PROPOFOL N/A 07/27/2014   Procedure: COLONOSCOPY WITH PROPOFOL;  Surgeon: Lucilla Lame, MD;  Location: [place];  Service: Endoscopy;  Laterality: N/A;  . colonscopy  07/27/2015  . DILATION AND CURETTAGE OF UTERUS  123456   complicated by heart failure  . ESOPHAGOGASTRODUODENOSCOPY (EGD) WITH PROPOFOL N/A 07/27/2014   Procedure: ESOPHAGOGASTRODUODENOSCOPY (EGD) WITH PROPOFOL;  Surgeon: Lucilla Lame, MD;  Location: [place];  Service: Endoscopy;  Laterality: N/A;  . TUBAL LIGATION    . UPPER ENDOSCOPY  W/ ESOPHAGEAL MANOMETRY    . VAGINAL HYSTERECTOMY      Home Medications:  Allergies as of 05/26/2019      Reactions   Latex    Swelling and itching   Lisinopril Cough   cough   Penicillins    swelling   Promethazine    Shellfish Allergy    hives   Sulfa Antibiotics    Rash and itching   Lamictal [lamotrigine] Rash   Blisters      Medication List       Accurate as of May 26, 2019 11:41 AM. If you have any questions, ask your nurse or doctor.        ALPRAZolam 0.25 MG tablet Commonly known as: XANAX Take 0.25 mg by mouth 2 (two) times daily as needed for anxiety.   amLODipine 2.5 MG tablet Commonly known as: NORVASC Take 1 tablet (2.5 mg total) by mouth daily.   citalopram 10 MG tablet Commonly known as: CELEXA Take 10 mg by mouth daily.   doxycycline 100 MG tablet Commonly known as: VIBRA-TABS Take 1 tablet (100 mg total) by mouth 2 (two) times daily.   estrogens (conjugated) 0.3 MG tablet Commonly known as: Premarin Take 1 tablet (0.3 mg total) by mouth daily.   hyoscyamine 0.125 MG Tbdp disintergrating tablet Commonly known as: ANASPAZ Place 1 tablet (0.125 mg total) under the tongue every 6 (six) hours as needed. For abdominal /esophageal pain   omeprazole 20 MG capsule Commonly known as: PRILOSEC Take 1 capsule (20  mg total) by mouth daily.   ondansetron 8 MG disintegrating tablet Commonly known as: Zofran ODT Take 1 tablet (8 mg total) by mouth every 8 (eight) hours as needed.   polyethylene glycol 17 g packet Commonly known as: MIRALAX / GLYCOLAX Take by mouth.       Allergies:  Allergies  Allergen Reactions  . Latex     Swelling and itching   . Lisinopril Cough    cough  . Penicillins     swelling  . Promethazine   . Shellfish Allergy     hives  . Sulfa Antibiotics     Rash and itching   . Lamictal [Lamotrigine] Rash    Blisters     Family History: Family History  Problem Relation Age of Onset  . Cancer Maternal Aunt         parotid Ca  . Hearing loss Maternal Grandmother   . Alcohol abuse Maternal Grandmother   . Hypertension Mother   . Stroke Paternal Aunt   . Cancer Paternal Uncle        lung, nasopharnygeal  . Arthritis Maternal Grandfather   . Esophageal cancer Cousin 52  . Colon cancer Neg Hx   . Liver disease Neg Hx   . Breast cancer Neg Hx   . Ovarian cancer Neg Hx   . Diabetes Neg Hx     Social History:  reports that she has never smoked. She has never used smokeless tobacco. She reports current alcohol use of about 1.0 standard drinks of alcohol per week. She reports that she does not use drugs.   Physical Exam: BP (!) 160/90   Pulse 71   Ht 5\' 2"  (1.575 m)   Wt 127 lb (57.6 kg)   BMI 23.23 kg/m   Constitutional:  Alert and oriented, No acute distress. HEENT: [place] AT, moist mucus membranes.  Trachea midline, no masses. Cardiovascular: No clubbing, cyanosis, or edema. Respiratory: Normal respiratory effort, no increased work of breathing. GI: Abdomen is soft, nontender, nondistended, no abdominal masses Neurologic: Grossly intact, no focal deficits, moving all 4 extremities. Psychiatric: Normal mood and affect.  Laboratory Data:  Lab Results  Component Value Date   CREATININE 0.71 02/24/2019    Urinalysis -Dipstick 1+ blood/microscopy negative  Pertinent Imaging: CT 02/24/2019 personally reviewed  Assessment & Plan: 1.  Microhematuria - Risk stratification: Low - Negative upper tract imaging - 6 month urinalysis follow up   - Based on American Urological Association practice guidelines asymptomatic microhematuria [facility]) is defined as three or greater red blood cells per high powered field on a properly collected urinary specimen in the absence of an obvious benign cause. A positive dipstick does not define AMH, and evaluation should be based solely on findings from microscopic examination of urinary sediment and not on a dipstick reading.   [facility] [address], [place], [place] [address] [phone]  I, Joneen Boers Peace, am acting as a Education administrator for Dr. Nicki Reaper C. Aidynn Polendo.  I have reviewed the above documentation for accuracy and completeness, and I agree with the above.   Abbie Sons, MD

## 2019-06-08 ENCOUNTER — Other Ambulatory Visit: Payer: Self-pay

## 2019-06-10 ENCOUNTER — Ambulatory Visit (INDEPENDENT_AMBULATORY_CARE_PROVIDER_SITE_OTHER): Payer: No Typology Code available for payment source | Admitting: Internal Medicine

## 2019-06-10 ENCOUNTER — Other Ambulatory Visit: Payer: Self-pay

## 2019-06-10 ENCOUNTER — Encounter: Payer: Self-pay | Admitting: Internal Medicine

## 2019-06-10 VITALS — BP 136/92 | HR 70 | Temp 97.9°F | Resp 14 | Ht 62.0 in | Wt 125.4 lb

## 2019-06-10 DIAGNOSIS — R252 Cramp and spasm: Secondary | ICD-10-CM | POA: Insufficient documentation

## 2019-06-10 LAB — BASIC METABOLIC PANEL
BUN: 10 mg/dL (ref 6–23)
CO2: 32 mEq/L (ref 19–32)
Calcium: 9.9 mg/dL (ref 8.4–10.5)
Chloride: 102 mEq/L (ref 96–112)
Creatinine, Ser: 0.67 mg/dL (ref 0.40–1.20)
GFR: 94.41 mL/min (ref >60.00)
Glucose, Bld: 72 mg/dL (ref 70–99)
Potassium: 4 mEq/L (ref 3.5–5.1)
Sodium: 138 mEq/L (ref 135–145)

## 2019-06-10 LAB — TSH: TSH: 0.57 u[IU]/mL (ref 0.35–4.50)

## 2019-06-10 LAB — MAGNESIUM: Magnesium: 2.2 mg/dL (ref 1.5–2.5)

## 2019-06-10 MED ORDER — ESTRADIOL 1 MG PO TABS: 1.0000 mg | ORAL_TABLET | Freq: Every day | ORAL | 2 refills | Status: DC

## 2019-06-10 NOTE — Progress Notes (Signed)
Subjective:  Patient ID: Connie Francis, female    DOB: 26-Nov-1970  Age: 49 y.o. MRN: [id number]  CC: The encounter diagnosis was Muscle cramps.  HPI Connie Francis presents for evaluation of muscle cramps Sunday:  Foot cramp occurred after wearing heels to church,  But also happening at night .  Denies any use of diuretics.  Cramps only occurring in the calves and feet,  Most recently just in th efoot. Occurring during sons's baseball games as well.   Happening since Easter,  Shoe change with the season: wearing a lot of "flip flops" with no arch support.   This visit occurred during the SARS-CoV-2 public health emergency.  Safety protocols were in place, including screening questions prior to the visit, additional usage of staff PPE, and extensive cleaning of exam room while observing appropriate contact time as indicated for disinfecting solutions.    Patient has received both doses of the available COVID 19 vaccine without complications.  Patient continues to mask when outside of the home except when walking in yard or at safe distances from others .  Patient denies any change in mood or development of unhealthy behaviors resuting from the pandemic's restriction of activities and socialization.     Outpatient Medications Prior to Visit  Medication Sig Dispense Refill  . ALPRAZolam (XANAX) 0.25 MG tablet Take 0.25 mg by mouth 2 (two) times daily as needed for anxiety.    Marland Kitchen amLODipine (NORVASC) 2.5 MG tablet Take 1 tablet (2.5 mg total) by mouth daily. 30 tablet 5  . citalopram (CELEXA) 10 MG tablet Take 10 mg by mouth daily.     . citalopram (CELEXA) 20 MG tablet Take 20 mg by mouth daily.     Marland Kitchen estrogens, conjugated, (PREMARIN) 0.3 MG tablet Take 1 tablet (0.3 mg total) by mouth daily. 90 tablet 1  . hyoscyamine (ANASPAZ) 0.125 MG TBDP disintergrating tablet Place 1 tablet (0.125 mg total) under the tongue every 6 (six) hours as needed. For abdominal /esophageal pain 30 tablet  0  . omeprazole (PRILOSEC) 20 MG capsule Take 1 capsule (20 mg total) by mouth daily. 30 capsule 3  . ondansetron (ZOFRAN ODT) 8 MG disintegrating tablet Take 1 tablet (8 mg total) by mouth every 8 (eight) hours as needed. 6 tablet 0  . polyethylene glycol (MIRALAX / GLYCOLAX) packet Take by mouth.    . doxycycline (VIBRA-TABS) 100 MG tablet Take 1 tablet (100 mg total) by mouth 2 (two) times daily. (Patient not taking: Reported on 05/26/2019) 20 tablet 0   No facility-administered medications prior to visit.    Review of Systems;  Patient denies headache, fevers, malaise, unintentional weight loss, skin rash, eye pain, sinus congestion and sinus pain, sore throat, dysphagia,  hemoptysis , cough, dyspnea, wheezing, chest pain, palpitations, orthopnea, edema, abdominal pain, nausea, melena, diarrhea, constipation, flank pain, dysuria, hematuria, urinary  Frequency, nocturia, numbness, tingling, seizures,  Focal weakness, Loss of consciousness,  Tremor, insomnia, depression, anxiety, and suicidal ideation.      Objective:  BP (!) 136/92 (BP Location: Left Arm, Patient Position: Sitting, Cuff Size: Normal)   Pulse 70   Temp 97.9 F (36.6 C) (Temporal)   Resp 14   Ht 5\' 2"  (1.575 m)   Wt 125 lb 6.4 oz (56.9 kg)   SpO2 97%   BMI 22.94 kg/m   BP Readings from Last 3 Encounters:  06/10/19 (!) 136/92  05/26/19 (!) 160/90  02/24/19 133/84    Wt Readings from Last 3  Encounters:  06/10/19 125 lb 6.4 oz (56.9 kg)  05/26/19 127 lb (57.6 kg)  02/24/19 127 lb (57.6 kg)    General appearance: alert, cooperative and appears stated age Ears: normal TM's and external ear canals both ears Throat: lips, mucosa, and tongue normal; teeth and gums normal Neck: no adenopathy, no carotid bruit, supple, symmetrical, trachea midline and thyroid not enlarged, symmetric, no tenderness/mass/nodules Back: symmetric, no curvature. ROM normal. No CVA tenderness. Lungs: clear to auscultation  bilaterally Heart: regular rate and rhythm, S1, S2 normal, no murmur, click, rub or gallop Abdomen: soft, non-tender; bowel sounds normal; no masses,  no organomegaly Pulses: 2+ and symmetric Skin: Skin color, texture, turgor normal. No rashes or lesions Lymph nodes: Cervical, supraclavicular, and axillary nodes normal.  Lab Results  Component Value Date   HGBA1C 4.8 07/20/2015   HGBA1C 5.3 12/05/2014    Lab Results  Component Value Date   CREATININE 0.71 02/24/2019   CREATININE 0.74 02/16/2019   CREATININE 0.73 11/13/2018    Lab Results  Component Value Date   WBC 7.2 02/24/2019   HGB 12.9 02/24/2019   HCT 39.0 02/24/2019   PLT 260 02/24/2019   GLUCOSE 104 (H) 02/24/2019   CHOL 212 (H) 11/13/2018   TRIG 137.0 11/13/2018   HDL 52.30 11/13/2018   LDLCALC 132 (H) 11/13/2018   ALT 19 02/24/2019   AST 22 02/24/2019   NA 141 02/24/2019   K 3.3 (L) 02/24/2019   CL 102 02/24/2019   CREATININE 0.71 02/24/2019   BUN 16 02/24/2019   CO2 29 02/24/2019   TSH 0.78 11/13/2018   HGBA1C 4.8 07/20/2015   MICROALBUR <0.7 11/13/2018    CT ABDOMEN PELVIS W CONTRAST  Result Date: 02/24/2019 CLINICAL DATA:  Sudden onset of right-sided abdominal pain EXAM: CT ABDOMEN AND PELVIS WITH CONTRAST TECHNIQUE: Multidetector CT imaging of the abdomen and pelvis was performed using the standard protocol following bolus administration of intravenous contrast. CONTRAST:  132mL OMNIPAQUE IOHEXOL 300 MG/ML  SOLN COMPARISON:  06/25/2018 FINDINGS: Lower chest:  No contributory findings. Hepatobiliary: No focal liver abnormality.No evidence of biliary obstruction or stone. Pancreas: Unremarkable. Spleen: Unremarkable. Adrenals/Urinary Tract: Negative adrenals. No hydronephrosis or stone. Small focus of scarring at the upper pole left kidney. Unremarkable bladder. Stomach/Bowel:  No obstruction. No appendicitis. Vascular/Lymphatic: No acute vascular abnormality. No mass or adenopathy. Reproductive:Hysterectomy.  Other: No ascites or pneumoperitoneum. Musculoskeletal: No acute abnormalities. Mild dextrocurvature of the lumbar spine. IMPRESSION: No acute finding or explanation for abdominal pain. Electronically Signed   By: Monte Fantasia M.D.   On: 02/24/2019 06:15    Assessment & Plan:   Problem List Items Addressed This Visit      Unprioritized   Muscle cramps - Primary    Checking electrolytes and thyroid function.  Recommending change in footwear to provide arch support,  Stretching calf muscles before bedtime, and if lbs normal recommend an ounce of pickle juice      Relevant Orders   Basic metabolic panel   Magnesium   TSH     A total of 20  minutes of face to face time was spent with patient more than half of which was spent in counselling about the above mentioned conditions  and coordination of care  I have discontinued Jerelene T. Dymond's doxycycline. I am also having her start on estradiol. Additionally, I am having her maintain her ALPRAZolam, polyethylene glycol, amLODipine, citalopram, ondansetron, hyoscyamine, omeprazole, estrogens (conjugated), and citalopram.  Meds ordered this encounter  Medications  .  estradiol (ESTRACE) 1 MG tablet    Sig: Take 1 tablet (1 mg total) by mouth daily.    Dispense:  30 tablet    Refill:  2    Medications Discontinued During This Encounter  Medication Reason  . doxycycline (VIBRA-TABS) 100 MG tablet Completed Course    Follow-up: No follow-ups on file.   Crecencio Mc, MD

## 2019-06-10 NOTE — Patient Instructions (Signed)
If your labs look ok,  I recommend drinking one ounce of pickle juice daily in the afternoon or early evening!   Muscle Cramps and Spasms Muscle cramps and spasms occur when a muscle or muscles tighten and you have no control over this tightening (involuntary muscle contraction). They are a common problem and can develop in any muscle. The most common place is in the calf muscles of the leg. Muscle cramps and muscle spasms are both involuntary muscle contractions, but there are some differences between the two:  Muscle cramps are painful. They come and go and may last for a few seconds or up to 15 minutes. Muscle cramps are often more forceful and last longer than muscle spasms.  Muscle spasms may or may not be painful. They may also last just a few seconds or much longer. Certain medical conditions, such as diabetes or Parkinson's disease, can make it more likely to develop cramps or spasms. However, cramps or spasms are usually not caused by a serious underlying problem. Common causes include:  Doing more physical work or exercise than your body is ready for (overexertion).  Overuse from repeating certain movements too many times.  Remaining in a certain position for a long period of time.  Improper preparation, form, or technique while playing a sport or doing an activity.  Dehydration.  Injury.  Side effects of some medicines.  Abnormally low levels of the salts and minerals in your blood (electrolytes), especially potassium and calcium. This could happen if you are taking water pills (diuretics) or if you are pregnant. In many cases, the cause of muscle cramps or spasms is not known. Follow these instructions at home: Managing pain and stiffness      Try massaging, stretching, and relaxing the affected muscle. Do this for several minutes at a time.  If directed, apply heat to tight or tense muscles as often as told by your health care provider. Use the heat source that your  health care provider recommends, such as a moist heat pack or a heating pad. ? Place a towel between your skin and the heat source. ? Leave the heat on for 20-30 minutes. ? Remove the heat if your skin turns bright red. This is especially important if you are unable to feel pain, heat, or cold. You may have a greater risk of getting burned.  If directed, put ice on the affected area. This may help if you are sore or have pain after a cramp or spasm. ? Put ice in a plastic bag. ? Place a towel between your skin and the bag. ? Leavethe ice on for 20 minutes, 2-3 times a day.  Try taking hot showers or baths to help relax tight muscles. Eating and drinking  Drink enough fluid to keep your urine pale yellow. Staying well hydrated may help prevent cramps or spasms.  Eat a healthy diet that includes plenty of nutrients to help your muscles function. A healthy diet includes fruits and vegetables, lean protein, whole grains, and low-fat or nonfat dairy products. General instructions  If you are having frequent cramps, avoid intense exercise for several days.  Take over-the-counter and prescription medicines only as told by your health care provider.  Pay attention to any changes in your symptoms.  Keep all follow-up visits as told by your health care provider. This is important. Contact a health care provider if:  Your cramps or spasms get more severe or happen more often.  Your cramps or spasms do not  improve over time. Summary  Muscle cramps and spasms occur when a muscle or muscles tighten and you have no control over this tightening (involuntary muscle contraction).  The most common place for cramps or spasms to occur is in the calf muscles of the leg.  Massaging, stretching, and relaxing the affected muscle may relieve the cramp or spasm.  Drink enough fluid to keep your urine pale yellow. Staying well hydrated may help prevent cramps or spasms. This information is not intended  to replace advice given to you by your health care provider. Make sure you discuss any questions you have with your health care provider. Document Revised: 05/26/2017 Document Reviewed: 05/26/2017 Elsevier Patient Education  [place].

## 2019-06-10 NOTE — Assessment & Plan Note (Signed)
Checking electrolytes and thyroid function.  Recommending change in footwear to provide arch support,  Stretching calf muscles before bedtime, and if lbs normal recommend an ounce of pickle juice

## 2019-09-17 ENCOUNTER — Other Ambulatory Visit: Payer: Self-pay | Admitting: Internal Medicine

## 2019-09-28 ENCOUNTER — Other Ambulatory Visit: Payer: Self-pay | Admitting: Internal Medicine

## 2019-09-28 DIAGNOSIS — Z1231 Encounter for screening mammogram for malignant neoplasm of breast: Secondary | ICD-10-CM

## 2019-10-15 ENCOUNTER — Ambulatory Visit
Admission: RE | Admit: 2019-10-15 | Discharge: 2019-10-15 | Disposition: A | Discharge status: Home / Self Care | Payer: No Typology Code available for payment source | Source: Ambulatory Visit | Attending: Internal Medicine | Admitting: Internal Medicine

## 2019-10-15 ENCOUNTER — Other Ambulatory Visit: Payer: Self-pay

## 2019-10-15 DIAGNOSIS — Z1231 Encounter for screening mammogram for malignant neoplasm of breast: Secondary | ICD-10-CM | POA: Insufficient documentation

## 2019-10-22 ENCOUNTER — Other Ambulatory Visit: Payer: Self-pay | Admitting: Psychiatry

## 2019-10-29 ENCOUNTER — Telehealth: Payer: Self-pay | Admitting: Internal Medicine

## 2019-10-29 NOTE — Telephone Encounter (Signed)
Pt sent MyChart message  Please Triage     ===View-only below this line=== ----- Message ----- From: [name] Sent: 10/29/2019   9:01 AM EDT To: [facility] Pool Subject: Appointment Request                            Appointment Request From: [name]  With Provider: Crecencio Mc, MD [[facility]]  Preferred Date Range: 10/29/2019 - 10/29/2019  Preferred Times: Any Time  Reason for visit: Request an Appointment  Comments: Pain in left foot up leg. Think possible DVT

## 2019-10-29 NOTE — Telephone Encounter (Signed)
Called and spoke to [facility] about her foot and leg pain. Connie Francis states that she woke up around 3am with a burning sensation moving up her left leg. As she does daily activities, she experiencines sharp left foot pain that travels up her leg. She reports no redness but the sensation of her leg being "tight" along the lower thigh. Pain is intermittent along left foot and lower leg. Spoke with Dr. Olivia Mackie and was instructed to let patient know to seek treatment at a local urgent care. Recommended pt to go to the nearest urgent care and seek treatment for same day. Connie Francis verbalized understanding and had no further question.

## 2019-11-19 ENCOUNTER — Encounter: Payer: Self-pay | Admitting: Internal Medicine

## 2019-11-19 ENCOUNTER — Other Ambulatory Visit: Payer: Self-pay | Admitting: Psychiatry

## 2019-11-19 ENCOUNTER — Other Ambulatory Visit: Payer: Self-pay

## 2019-11-19 ENCOUNTER — Ambulatory Visit (INDEPENDENT_AMBULATORY_CARE_PROVIDER_SITE_OTHER): Payer: No Typology Code available for payment source | Admitting: Internal Medicine

## 2019-11-19 VITALS — BP 128/82 | HR 66 | Temp 98.5°F | Resp 15 | Ht 62.0 in | Wt 134.2 lb

## 2019-11-19 DIAGNOSIS — R519 Headache, unspecified: Secondary | ICD-10-CM

## 2019-11-19 DIAGNOSIS — R252 Cramp and spasm: Secondary | ICD-10-CM

## 2019-11-19 DIAGNOSIS — Z0000 Encounter for general adult medical examination without abnormal findings: Secondary | ICD-10-CM | POA: Diagnosis not present

## 2019-11-19 DIAGNOSIS — E559 Vitamin D deficiency, unspecified: Secondary | ICD-10-CM

## 2019-11-19 DIAGNOSIS — N62 Hypertrophy of breast: Secondary | ICD-10-CM

## 2019-11-19 DIAGNOSIS — Z79899 Other long term (current) drug therapy: Secondary | ICD-10-CM | POA: Diagnosis not present

## 2019-11-19 DIAGNOSIS — R7301 Impaired fasting glucose: Secondary | ICD-10-CM | POA: Diagnosis not present

## 2019-11-19 DIAGNOSIS — R61 Generalized hyperhidrosis: Secondary | ICD-10-CM

## 2019-11-19 DIAGNOSIS — F422 Mixed obsessional thoughts and acts: Secondary | ICD-10-CM | POA: Diagnosis not present

## 2019-11-19 DIAGNOSIS — K5909 Other constipation: Secondary | ICD-10-CM

## 2019-11-19 DIAGNOSIS — I10 Essential (primary) hypertension: Secondary | ICD-10-CM

## 2019-11-19 NOTE — Progress Notes (Signed)
Patient ID: Connie Francis, female    DOB: 12/23/1970  Age: 49 y.o. MRN: [id number]  The patient is here for annual preventive  examination and management of other chronic and acute problems.   The risk factors are reflected in the social history.  The roster of all physicians providing medical care to patient - is listed in the Snapshot section of the chart.  Activities of daily living:  The patient is 100% independent in all ADLs: dressing, toileting, feeding as well as independent mobility  Home safety : The patient has smoke detectors in the home. They wear seatbelts.  There are no firearms at home. There is no violence in the home.   There is no risks for hepatitis, STDs or HIV. There is no   history of blood transfusion. They have no travel history to infectious disease endemic areas of the world.  The patient has seen their dentist in the last six month. They have seen their eye doctor in the last year. They admit to slight hearing difficulty with regard to whispered voices and some television programs.  They have deferred audiologic testing in the last year.  They do not  have excessive sun exposure. Discussed the need for sun protection: hats, long sleeves and use of sunscreen if there is significant sun exposure.   Diet: the importance of a healthy diet is discussed. They do have a healthy diet.  The benefits of regular aerobic exercise were discussed. She walks 4 times per week ,  20 minutes.   Depression screen: there are no signs or vegative symptoms of depression- irritability, change in appetite, anhedonia, sadness/tearfullness.  Cognitive assessment: the patient manages all their financial and personal affairs and is actively engaged. They could relate day,date,year and events; recalled 2/3 objects at 3 minutes; performed clock-face test normally.  The following portions of the patient's history were reviewed and updated as appropriate: allergies, current medications, past  family history, past medical history,  past surgical history, past social history  and problem list.  Visual acuity was not assessed per patient preference since she has regular follow up with her ophthalmologist. Hearing and body mass index were assessed and reviewed.   During the course of the visit the patient was educated and counseled about appropriate screening and preventive services including : fall prevention , diabetes screening, nutrition counseling, colorectal cancer screening, and recommended immunizations.    CC: The primary encounter diagnosis was Essential hypertension. Diagnoses of Breast hypertrophy in female, Vitamin D deficiency, Chronic constipation, Long-term use of high-risk medication, Night sweats, Impaired fasting glucose, Mixed obsessional thoughts and acts, Recurrent headache, Visit for preventive health examination, and Muscle cramps were also pertinent to this visit.   1) increased frequency of headaches.  Occurrence rate is minimally one per week sometimes more,  Sometimes one headache will last up to 3 days accompanied by "brain fog" and feel like migraine.  Sometimes wakes up with them,  Some headaches occur later in the day,  Some start in the jaw and some behind the eyes.  Using ibuprofen which  Results in symptoms of gastritis .  Reducing her dose of celexa has reduced the brain fog.    Used nortiptyline I n the past for same reason.  Had migraine studies by Melrose Nakayama in the past .  Not history of OSA< not sure if she snores.   2) back and shoulder pain due to breast hypertrophy.   36 DDD.  She has evidence of Bra strap leaving  a deep  large indentation in both shoulders.  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   She has a past surgical history that includes Dilation and curettage of uterus (2006); Cesarean section; Upper endoscopy w/ esophageal manometry; Tubal ligation; Esophagogastroduodenoscopy (egd) with propofol (N/A, 07/27/2014); Colonoscopy with propofol (N/A, 07/27/2014); colonscopy (07/27/2015); Vaginal hysterectomy; and Breast cyst aspiration (Left).   Her family history includes Alcohol abuse in her maternal grandmother; Arthritis in her maternal grandfather; Cancer in her maternal aunt and paternal uncle; Esophageal cancer (age of onset: 79) in her cousin; Hearing loss in her maternal grandmother; Hypertension in her mother; Stroke in her paternal aunt.She reports that she has never smoked. She has never used smokeless tobacco. She reports current alcohol use of about 1.0 standard drink of alcohol per week. She reports that she does not use drugs.  Outpatient Medications Prior to Visit  Medication Sig Dispense Refill   ALPRAZolam (XANAX) 0.25 MG tablet Take 0.25 mg by mouth 2 (two) times daily as needed for anxiety.     citalopram (CELEXA) 10 MG tablet Take 10 mg by mouth daily.      estradiol (ESTRACE) 1 MG tablet TAKE 1 TABLET BY MOUTH DAILY. 30 tablet 2   hyoscyamine (ANASPAZ) 0.125 MG TBDP disintergrating tablet Place 1 tablet (0.125 mg total) under the tongue every 6 (six) hours as needed. For abdominal /esophageal pain 30 tablet 0   polyethylene glycol (MIRALAX / GLYCOLAX) packet Take by mouth.     amLODipine (NORVASC) 2.5 MG tablet Take 1 tablet (2.5 mg total) by mouth daily. (Patient not taking: Reported on 11/19/2019) 30 tablet 5   citalopram (CELEXA) 20 MG tablet Take 20 mg by mouth daily.  (Patient not taking: Reported on  11/19/2019)     estrogens, conjugated, (PREMARIN) 0.3 MG tablet Take 1 tablet (0.3 mg total) by mouth daily. (Patient not taking: Reported on 11/19/2019) 90 tablet 1   omeprazole (PRILOSEC) 20 MG capsule Take 1 capsule (20 mg total) by mouth daily. (Patient not taking: Reported on 11/19/2019) 30 capsule 3   ondansetron (ZOFRAN ODT) 8 MG disintegrating tablet Take 1 tablet (8 mg total) by mouth every 8 (eight) hours as needed. (Patient not taking: Reported on 11/19/2019) 6 tablet 0   No facility-administered medications prior to visit.    Review of Systems   Patient denies headache, fevers, malaise, unintentional weight loss, skin rash, eye pain, sinus congestion and sinus pain, sore throat, dysphagia,  hemoptysis , cough, dyspnea, wheezing, chest pain, palpitations, orthopnea, edema, abdominal pain, nausea, melena, diarrhea, constipation, flank pain, dysuria, hematuria, urinary  Frequency, nocturia, numbness, tingling, seizures,  Focal weakness, Loss of consciousness,  Tremor, insomnia, depression, anxiety, and suicidal ideation.      Objective:  BP 128/82 (BP Location: Left Arm, Patient Position: Sitting, Cuff Size: Normal)    Pulse 66    Temp 98.5 F (36.9 C) (Oral)    Resp 15    Ht 5\' 2"  (1.575 m)    Wt 134 lb 3.2 oz (60.9 kg)    SpO2 99%    BMI 24.55 kg/m   Physical Exam   General appearance: alert, cooperative  and appears stated age Head: Normocephalic, without obvious abnormality, atraumatic Eyes: conjunctivae/corneas clear. PERRL, EOM's intact. Fundi benign. Ears: normal TM's and external ear canals both ears Nose: Nares normal. Septum midline. Mucosa normal. No drainage or sinus tenderness. Throat: lips, mucosa, and tongue normal; teeth and gums normal Neck: no adenopathy, no carotid bruit, no JVD, supple, symmetrical, trachea midline and thyroid not enlarged, symmetric, no tenderness/mass/nodules Lungs: clear to auscultation bilaterally Breasts: deferred per patient preference   Shoulders deep indentations in both shoulders despite use of wide straps Heart: regular rate and rhythm, S1, S2 normal, no murmur, click, rub or gallop Abdomen: soft, non-tender; bowel sounds normal; no masses,  no organomegaly Extremities: extremities normal, atraumatic, no cyanosis or edema Pulses: 2+ and symmetric Skin: Skin color, texture, turgor normal. No rashes or lesions Neurologic: Alert and oriented X 3, normal strength and tone. Normal symmetric reflexes. Normal coordination and gait.    Assessment & Plan:   Problem List Items Addressed This Visit      Unprioritized   Breast hypertrophy in female    She has chronic back and shoulder pain from breast hypertrophy and evidence of stress changes to shoulder anatomy. .  She is not obese. Referral for breast reduction requested and In progress      Relevant Orders   Ambulatory referral to Plastic Surgery   Chronic constipation   Relevant Orders   Comprehensive metabolic panel   Essential hypertension - Primary    she reports compliance with medication regimen  and has an elevated reading today in office.  She is not using NSAIDs daily.   She has been checking her BP at home using a calibrated machine and reports that her most recent readings have been over 130/80  But< 150/80 .  Resuming amlodipine 2.5 mg daily       Relevant Orders   Lipid panel   Long-term use of high-risk medication   Muscle cramps    Resolved with prn use of pickle juice.        Night sweats   Relevant Orders   CBC with Differential/Platelet   TSH   Obsessive compulsive disorder    Managed with zoloft 50 mg daily by Dr. Nicolasa Ducking, no changes today       Recurrent headache    Secondary to fatigue and eye strain vs migraine.  Occurring > 1/week,  Resuming amlodipine for elevated readings and reassess in 2 weeks.         Visit for preventive health examination    age appropriate education and counseling updated, referrals for preventative services  and immunizations addressed, dietary and smoking counseling addressed, most recent labs reviewed.  I have personally reviewed and have noted:  1) the patient's medical and social history 2) The pt's use of alcohol, tobacco, and illicit drugs 3) The patient's current medications and supplements 4) Functional ability including ADL's, fall risk, home safety risk, hearing and visual impairment 5) Diet and physical activities 6) Evidence for depression or mood disorder 7) The patient's height, weight, and BMI have been recorded in the chart  I have made referrals, and provided counseling and education based on review of the above       Other Visit Diagnoses    Vitamin D deficiency       Relevant Orders   VITAMIN D 25 Hydroxy (Vit-D Deficiency, Fractures)   Impaired fasting glucose       Relevant Orders   Hemoglobin A1c      I have  discontinued Rondalyn T. Burkemper's ondansetron, omeprazole, and estrogens (conjugated). I am also having her maintain her ALPRAZolam, polyethylene glycol, amLODipine, citalopram, hyoscyamine, and estradiol.  No orders of the defined types were placed in this encounter.   Medications Discontinued During This Encounter  Medication Reason   citalopram (CELEXA) 20 MG tablet    estrogens, conjugated, (PREMARIN) 0.3 MG tablet Cost of medication   omeprazole (PRILOSEC) 20 MG capsule    ondansetron (ZOFRAN ODT) 8 MG disintegrating tablet     Follow-up: No follow-ups on file.   Crecencio Mc, MD

## 2019-11-19 NOTE — Patient Instructions (Addendum)
Resume amlodipine 2.5 mg for blood pressure  Start checking  BP in one week every day    stop using ibuprofen   Use 1000 mg tylenol for next headache  Send me an update in 2 weeks about headaches and BP readings  TDaP is due next year  Colonoscopy due in 2026  Referral for breast reduction under way  Return for fasting labs (we need at least a 4 hour fast  )

## 2019-11-21 ENCOUNTER — Encounter: Payer: Self-pay | Admitting: Internal Medicine

## 2019-11-21 DIAGNOSIS — N62 Hypertrophy of breast: Secondary | ICD-10-CM | POA: Insufficient documentation

## 2019-11-21 NOTE — Assessment & Plan Note (Signed)
Secondary to fatigue and eye strain vs migraine.  Occurring > 1/week,  Resuming amlodipine for elevated readings and reassess in 2 weeks.

## 2019-11-21 NOTE — Assessment & Plan Note (Signed)
Resolved with prn use of pickle juice.

## 2019-11-21 NOTE — Assessment & Plan Note (Signed)
She has chronic back and shoulder pain from breast hypertrophy and evidence of stress changes to shoulder anatomy. .  She is not obese. Referral for breast reduction requested and In progress

## 2019-11-21 NOTE — Assessment & Plan Note (Signed)

## 2019-11-21 NOTE — Assessment & Plan Note (Signed)
Managed with zoloft 50 mg daily by Dr. Nicolasa Ducking, no changes today

## 2019-11-21 NOTE — Assessment & Plan Note (Addendum)
she reports compliance with medication regimen  and has an elevated reading today in office.  She is not using NSAIDs daily.   She has been checking her BP at home using a calibrated machine and reports that her most recent readings have been over 130/80  But< 150/80 .  Resuming amlodipine 2.5 mg daily

## 2019-11-22 ENCOUNTER — Other Ambulatory Visit: Payer: Self-pay

## 2019-11-22 MED ORDER — AMLODIPINE BESYLATE 2.5 MG PO TABS: 2.5000 mg | ORAL_TABLET | Freq: Every day | ORAL | 5 refills | Status: DC

## 2019-11-24 ENCOUNTER — Other Ambulatory Visit: Payer: Self-pay

## 2019-11-24 ENCOUNTER — Other Ambulatory Visit (INDEPENDENT_AMBULATORY_CARE_PROVIDER_SITE_OTHER): Payer: No Typology Code available for payment source

## 2019-11-24 DIAGNOSIS — R61 Generalized hyperhidrosis: Secondary | ICD-10-CM | POA: Diagnosis not present

## 2019-11-24 DIAGNOSIS — I10 Essential (primary) hypertension: Secondary | ICD-10-CM | POA: Diagnosis not present

## 2019-11-24 DIAGNOSIS — K5909 Other constipation: Secondary | ICD-10-CM | POA: Diagnosis not present

## 2019-11-24 DIAGNOSIS — E559 Vitamin D deficiency, unspecified: Secondary | ICD-10-CM | POA: Diagnosis not present

## 2019-11-24 DIAGNOSIS — R7301 Impaired fasting glucose: Secondary | ICD-10-CM

## 2019-11-24 LAB — COMPREHENSIVE METABOLIC PANEL
ALT: 10 U/L (ref 0–35)
AST: 14 U/L (ref 0–37)
Albumin: 4.2 g/dL (ref 3.5–5.2)
Alkaline Phosphatase: 78 U/L (ref 39–117)
BUN: 14 mg/dL (ref 6–23)
CO2: 28 mEq/L (ref 19–32)
Calcium: 8.7 mg/dL (ref 8.4–10.5)
Chloride: 101 mEq/L (ref 96–112)
Creatinine, Ser: 0.72 mg/dL (ref 0.40–1.20)
GFR: 99.66 mL/min (ref >60.00)
Glucose, Bld: 86 mg/dL (ref 70–99)
Potassium: 3.9 mEq/L (ref 3.5–5.1)
Sodium: 138 mEq/L (ref 135–145)
Total Bilirubin: 0.4 mg/dL (ref 0.2–1.2)
Total Protein: 7 g/dL (ref 6.0–8.3)

## 2019-11-24 LAB — LIPID PANEL
Cholesterol: 200 mg/dL (ref 0–200)
HDL: 70.2 mg/dL (ref >39.00)
LDL Cholesterol: 107 mg/dL — ABNORMAL HIGH (ref 0–99)
NonHDL: 129.65
Total CHOL/HDL Ratio: 3
Triglycerides: 114 mg/dL (ref 0.0–149.0)
VLDL: 22.8 mg/dL (ref 0.0–40.0)

## 2019-11-24 LAB — CBC WITH DIFFERENTIAL/PLATELET
Basophils Absolute: 0 10*3/uL (ref 0.0–0.1)
Basophils Relative: 0.5 % (ref 0.0–3.0)
Eosinophils Absolute: 0 10*3/uL (ref 0.0–0.7)
Eosinophils Relative: 1 % (ref 0.0–5.0)
HCT: 38.8 % (ref 36.0–46.0)
Hemoglobin: 13 g/dL (ref 12.0–15.0)
Lymphocytes Relative: 38.6 % (ref 12.0–46.0)
Lymphs Abs: 1.7 10*3/uL (ref 0.7–4.0)
MCHC: 33.6 g/dL (ref 30.0–36.0)
MCV: 87.9 fl (ref 78.0–100.0)
Monocytes Absolute: 0.3 10*3/uL (ref 0.1–1.0)
Monocytes Relative: 7.6 % (ref 3.0–12.0)
Neutro Abs: 2.2 10*3/uL (ref 1.4–7.7)
Neutrophils Relative %: 52.3 % (ref 43.0–77.0)
Platelets: 229 10*3/uL (ref 150.0–400.0)
RBC: 4.41 Mil/uL (ref 3.87–5.11)
RDW: 12.5 % (ref 11.5–15.5)
WBC: 4.3 10*3/uL (ref 4.0–10.5)

## 2019-11-24 LAB — VITAMIN D 25 HYDROXY (VIT D DEFICIENCY, FRACTURES): VITD: 30.04 ng/mL (ref 30.00–100.00)

## 2019-11-24 LAB — TSH: TSH: 0.57 u[IU]/mL (ref 0.35–4.50)

## 2019-11-24 LAB — HEMOGLOBIN A1C: Hgb A1c MFr Bld: 5.3 % (ref 4.6–6.5)

## 2019-11-24 NOTE — Progress Notes (Signed)
Your labs are completely normal and cholesterol is fine

## 2019-11-25 ENCOUNTER — Encounter: Payer: Self-pay | Admitting: Psychiatry

## 2019-12-07 ENCOUNTER — Ambulatory Visit: Payer: No Typology Code available for payment source | Attending: Internal Medicine

## 2019-12-07 ENCOUNTER — Other Ambulatory Visit: Payer: Self-pay | Admitting: Internal Medicine

## 2019-12-07 DIAGNOSIS — Z23 Encounter for immunization: Secondary | ICD-10-CM

## 2019-12-07 NOTE — Progress Notes (Signed)
   Covid-19 Vaccination Clinic  Name:  Connie Francis    MRN: [id number] DOB: [birth year]  12/07/2019  [name] was observed post Covid-19 immunization for 15 minutes without incident. She was provided with Vaccine Information Sheet and instruction to access the V-Safe system.   [name] was instructed to call 911 with any severe reactions post vaccine: Marland Kitchen Difficulty breathing  . Swelling of face and throat  . A fast heartbeat  . A bad rash all over body  . Dizziness and weakness   Immunizations Administered    Name Date Dose VIS Date Route   Pfizer COVID-19 Vaccine 12/07/2019  2:53 PM 0.3 mL 11/03/2019 Intramuscular   Manufacturer: [place]   Lot: [id number]   [place]: [id number]

## 2019-12-24 ENCOUNTER — Other Ambulatory Visit: Payer: Self-pay | Admitting: Internal Medicine

## 2019-12-31 ENCOUNTER — Ambulatory Visit: Payer: No Typology Code available for payment source

## 2020-01-10 ENCOUNTER — Other Ambulatory Visit: Payer: No Typology Code available for payment source

## 2020-01-10 DIAGNOSIS — Z20822 Contact with and (suspected) exposure to covid-19: Secondary | ICD-10-CM

## 2020-01-11 LAB — NOVEL CORONAVIRUS, NAA: SARS-CoV-2, NAA: NOT DETECTED

## 2020-01-11 LAB — SARS-COV-2, NAA 2 DAY TAT

## 2020-01-21 ENCOUNTER — Other Ambulatory Visit: Payer: Self-pay | Admitting: Psychiatry

## 2020-01-28 ENCOUNTER — Other Ambulatory Visit: Payer: No Typology Code available for payment source

## 2020-01-28 ENCOUNTER — Other Ambulatory Visit: Payer: Self-pay

## 2020-01-28 DIAGNOSIS — Z20822 Contact with and (suspected) exposure to covid-19: Secondary | ICD-10-CM

## 2020-01-30 LAB — SARS-COV-2, NAA 2 DAY TAT

## 2020-01-30 LAB — NOVEL CORONAVIRUS, NAA: SARS-CoV-2, NAA: NOT DETECTED

## 2020-02-01 ENCOUNTER — Other Ambulatory Visit: Payer: No Typology Code available for payment source

## 2020-02-08 ENCOUNTER — Other Ambulatory Visit: Payer: Self-pay

## 2020-02-08 ENCOUNTER — Encounter: Payer: Self-pay | Admitting: Plastic Surgery

## 2020-02-08 ENCOUNTER — Ambulatory Visit: Payer: No Typology Code available for payment source | Admitting: Plastic Surgery

## 2020-02-08 VITALS — BP 134/80 | HR 65 | Ht 62.0 in | Wt 137.0 lb

## 2020-02-08 DIAGNOSIS — N62 Hypertrophy of breast: Secondary | ICD-10-CM | POA: Diagnosis not present

## 2020-02-08 DIAGNOSIS — M542 Cervicalgia: Secondary | ICD-10-CM | POA: Diagnosis not present

## 2020-02-08 DIAGNOSIS — G8929 Other chronic pain: Secondary | ICD-10-CM

## 2020-02-08 DIAGNOSIS — M546 Pain in thoracic spine: Secondary | ICD-10-CM | POA: Diagnosis not present

## 2020-02-08 NOTE — Progress Notes (Signed)
Patient ID: Connie Francis, female    DOB: May 10, 1970, 50 y.o.   MRN: [id number]   Chief Complaint  Patient presents with  . Advice Only    Mammary Hyperplasia: The patient is a 50 y.o. female with a history of mammary hyperplasia for several years.  She has extremely large breasts causing symptoms that include the following: Back pain in the upper and lower back, including neck pain. She pulls or pins her bra straps to provide better lift and relief of the pressure and pain. She notices relief by holding her breast up manually.  Her shoulder straps cause grooves and pain and pressure that requires padding for relief. Pain medication is sometimes required with motrin and tylenol.  Activities that are hindered by enlarged breasts include: exercise and running.  She has tried supportive clothing as well as fitted bras without improvement.  Her breasts are extremely large and fairly symmetric.  She has hyperpigmentation of the inframammary area on both sides.  The sternal to nipple distance on the right is 28 cm and the left is 30 cm.  The IMF distance is 16 cm.  She is 5 feet 2 inches tall and weighs 137 pounds.  Preoperative bra size = 34 DDD cup. She would like to be much smaller.  The estimated excess breast tissue to be removed at the time of surgery = 350 grams on the left and 350 grams on the right.  Mammogram history: 2021 negative.  Family history of breast cancer:  Maternal aunt.  Tobacco use:  none.  No PT but she is willing to go.  She is a Orthoptist for [employer]   Review of Systems  Constitutional: Positive for activity change. Negative for appetite change.  HENT: Negative.   Eyes: Negative.   Respiratory: Negative.  Negative for chest tightness and shortness of breath.   Gastrointestinal: Negative.   Endocrine: Negative.   Genitourinary: Negative.   Musculoskeletal: Positive for back pain and neck pain.  Hematological: Negative.   Psychiatric/Behavioral: Negative.      Past Medical History:  Diagnosis Date  . Abdominal bloating   . Anxiety   . 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 CHF (congestive heart failure) ([place]) 2006  . Chronic epigastric pain   . Constipation   . GERD (gastroesophageal reflux disease)   . Hiatal hernia 2017  . Hypertension   . IBS (irritable bowel syndrome)   . Kidney stones   . Lump or mass in breast    R-Breast  . Microscopic hematuria 10/05/2017  . Migraine   . Obsessive compulsive disorder   . 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 Psychogenic dyspepsia 11/27/2012  . Thyroid disease    hyper    Past Surgical History:  Procedure Laterality Date  . BREAST CYST ASPIRATION Left    negative 2010  . CESAREAN SECTION    . COLONOSCOPY WITH PROPOFOL N/A 07/27/2014   Procedure: COLONOSCOPY WITH PROPOFOL;  Surgeon: Lucilla Lame, MD;  Location: [facility];  Service: Endoscopy;  Laterality: N/A;  . colonscopy  07/27/2015  . DILATION AND CURETTAGE OF UTERUS  2992   complicated by heart failure  . ESOPHAGOGASTRODUODENOSCOPY (EGD) WITH PROPOFOL N/A 07/27/2014   Procedure: ESOPHAGOGASTRODUODENOSCOPY (EGD) WITH PROPOFOL;  Surgeon: Lucilla Lame, MD;  Location: [facility];  Service: Endoscopy;  Laterality: N/A;  . TUBAL LIGATION    . UPPER ENDOSCOPY W/ ESOPHAGEAL MANOMETRY    . VAGINAL HYSTERECTOMY  Current Outpatient Medications:  .  ALPRAZolam (XANAX) 0.25 MG tablet, Take 0.25 mg by mouth 2 (two) times daily as needed for anxiety., Disp: , Rfl:  .  amLODipine (NORVASC) 2.5 MG tablet, Take 1 tablet (2.5 mg total) by mouth daily., Disp: 30 tablet, Rfl: 5 .  citalopram (CELEXA) 10 MG tablet, Take 10 mg by mouth daily. , Disp: , Rfl:  .  estradiol (ESTRACE) 1 MG tablet, TAKE 1 TABLET BY MOUTH DAILY., Disp: 30 tablet, Rfl: 2 .  hyoscyamine (ANASPAZ) 0.125 MG TBDP disintergrating tablet, Place 1 tablet (0.125 mg total) under the tongue every 6 (six) hours as needed. For abdominal /esophageal pain,  Disp: 30 tablet, Rfl: 0 .  polyethylene glycol (MIRALAX / GLYCOLAX) packet, Take by mouth., Disp: , Rfl:    Objective:   Vitals:   02/08/20 1316  BP: 134/80  Pulse: 65  SpO2: 96%    Physical Exam Vitals and nursing note reviewed.  Constitutional:      Appearance: Normal appearance.  HENT:     Head: Normocephalic and atraumatic.  Cardiovascular:     Rate and Rhythm: Normal rate.     Pulses: Normal pulses.  Pulmonary:     Effort: Pulmonary effort is normal. No respiratory distress.  Abdominal:     General: Abdomen is flat. There is no distension.     Tenderness: There is no abdominal tenderness.  Skin:    General: Skin is warm.     Coloration: Skin is not jaundiced.  Neurological:     General: No focal deficit present.     Mental Status: She is alert and oriented to person, place, and time.  Psychiatric:        Mood and Affect: Mood normal.        Behavior: Behavior normal.        Thought Content: Thought content normal.     Assessment & Plan:  Breast hypertrophy in female  Chronic bilateral thoracic back pain  Neck pain  Plan for bilateral breast reduction with possible liposuction.   Will need MM results. Plan for PT evaluation.  Patient knows to call when she has finished.    Risks reviewed with the patient including change in NAC sensation and loss of NAC.  Pictures were obtained of the patient and placed in the chart with the patient's or guardian's permission.   [place], DO

## 2020-02-17 ENCOUNTER — Other Ambulatory Visit: Payer: Self-pay

## 2020-02-17 ENCOUNTER — Ambulatory Visit: Payer: No Typology Code available for payment source | Admitting: Dermatology

## 2020-02-17 DIAGNOSIS — L578 Other skin changes due to chronic exposure to nonionizing radiation: Secondary | ICD-10-CM | POA: Diagnosis not present

## 2020-02-17 DIAGNOSIS — D1800 Hemangioma unspecified site: Secondary | ICD-10-CM

## 2020-02-17 DIAGNOSIS — L814 Other melanin hyperpigmentation: Secondary | ICD-10-CM

## 2020-02-17 DIAGNOSIS — L821 Other seborrheic keratosis: Secondary | ICD-10-CM

## 2020-02-17 DIAGNOSIS — Z1283 Encounter for screening for malignant neoplasm of skin: Secondary | ICD-10-CM | POA: Diagnosis not present

## 2020-02-17 DIAGNOSIS — D229 Melanocytic nevi, unspecified: Secondary | ICD-10-CM

## 2020-02-17 NOTE — Progress Notes (Signed)
   Follow-Up Visit   Subjective  Connie Francis is a 50 y.o. female who presents for the following: TBSE (Patient here for full body skin exam and skin cancer screening. Patient not aware of anything new or changing. No hx skin cancer. ).  The following portions of the chart were reviewed this encounter and updated as appropriate:   Tobacco  Allergies  Meds  Problems  Med Hx  Surg Hx  Fam Hx      Review of Systems:  No other skin or systemic complaints except as noted in HPI or Assessment and Plan.  Objective  Well appearing patient in no apparent distress; mood and affect are within normal limits.  A full examination was performed including scalp, head, eyes, ears, nose, lips, neck, chest, axillae, abdomen, back, buttocks, bilateral upper extremities, bilateral lower extremities, hands, feet, fingers, toes, fingernails, and toenails. All findings within normal limits unless otherwise noted below.    Assessment & Plan    Lentigines - Scattered tan macules - Discussed due to sun exposure - Benign, observe - Call for any changes  Seborrheic Keratoses - Stuck-on, waxy, tan-brown papules and plaques  - Discussed benign etiology and prognosis. - Observe - Call for any changes  Melanocytic Nevi - Tan-brown and/or pink-flesh-colored symmetric macules and papules - Benign appearing on exam today - Observation - Call clinic for new or changing moles - Recommend daily use of broad spectrum spf 30+ sunscreen to sun-exposed areas.   Hemangiomas - Red papules - Discussed benign nature - Observe - Call for any changes  Actinic Damage - Chronic, secondary to cumulative UV/sun exposure - diffuse scaly erythematous macules with underlying dyspigmentation - Recommend daily broad spectrum sunscreen SPF 30+ to sun-exposed areas, reapply every 2 hours as needed.  - Call for new or changing lesions.  Skin cancer screening performed today.   Return in about 1 year (around  02/16/2021) for TBSE.  Graciella Belton, RMA, am acting as scribe for Forest Gleason, MD .  Documentation: I have reviewed the above documentation for accuracy and completeness, and I agree with the above.  Forest Gleason, MD

## 2020-02-17 NOTE — Patient Instructions (Addendum)
Melanoma ABCDEs  Melanoma is the most dangerous type of skin cancer, and is the leading cause of death from skin disease.  You are more likely to develop melanoma if you:  Have light-colored skin, light-colored eyes, or red or blond hair  Spend a lot of time in the sun  Tan regularly, either outdoors or in a tanning bed  Have had blistering sunburns, especially during childhood  Have a close family member who has had a melanoma  Have atypical moles or large birthmarks  Early detection of melanoma is key since treatment is typically straightforward and cure rates are extremely high if we catch it early.   The first sign of melanoma is often a change in a mole or a new dark spot.  The ABCDE system is a way of remembering the signs of melanoma.  A for asymmetry:  The two halves do not match. B for border:  The edges of the growth are irregular. C for color:  A mixture of colors are present instead of an even brown color. D for diameter:  Melanomas are usually (but not always) greater than 60mm - the size of a pencil eraser. E for evolution:  The spot keeps changing in size, shape, and color.  Please check your skin once per month between visits. You can use a small mirror in front and a large mirror behind you to keep an eye on the back side or your body.   If you see any new or changing lesions before your next follow-up, please call to schedule a visit.  Please continue daily skin protection including broad spectrum sunscreen SPF 30+ to sun-exposed areas, reapplying every 2 hours as needed when you're outdoors.    Gentle Skin Care Guide  1. Bathe no more than once a day.  2. Avoid bathing in hot water  3. Use a mild soap like Dove, Vanicream, Cetaphil, CeraVe. Can use Lever 2000 or Cetaphil antibacterial soap  4. Use soap only where you need it. On most days, use it under your arms, between your legs, and on your feet. Let the water rinse other areas unless visibly dirty.  5.  When you get out of the bath/shower, use a towel to gently blot your skin dry, don't rub it.  6. While your skin is still a little damp, apply a moisturizing cream such as Vanicream, CeraVe, Cetaphil, Eucerin, Sarna lotion or plain Vaseline Jelly. For hands apply Neutrogena [place] Hand Cream or Excipial Hand Cream.  7. Reapply moisturizer any time you start to itch or feel dry.  8. Sometimes using free and clear laundry detergents can be helpful. Fabric softener sheets should be avoided. Downy Free & Gentle liquid, or any liquid fabric softener that is free of dyes and perfumes, it acceptable to use  9. If your doctor has given you prescription creams you may apply moisturizers over them    Recommend taking Heliocare sun protection supplement daily in sunny weather for additional sun protection. For maximum protection on the sunniest days, you can take up to 2 capsules of regular Heliocare OR take 1 capsule of Heliocare Ultra. For prolonged exposure (such as a full day in the sun), you can repeat your dose of the supplement 4 hours after your first dose. Heliocare can be purchased at [facility] or at [email or web address].

## 2020-02-21 ENCOUNTER — Encounter: Payer: Self-pay | Admitting: Physical Therapy

## 2020-02-21 ENCOUNTER — Ambulatory Visit: Payer: No Typology Code available for payment source | Attending: Plastic Surgery | Admitting: Physical Therapy

## 2020-02-21 ENCOUNTER — Other Ambulatory Visit: Payer: Self-pay

## 2020-02-21 DIAGNOSIS — G8929 Other chronic pain: Secondary | ICD-10-CM | POA: Diagnosis present

## 2020-02-21 DIAGNOSIS — M546 Pain in thoracic spine: Secondary | ICD-10-CM | POA: Diagnosis not present

## 2020-02-21 NOTE — Therapy (Signed)
[facility] [address] [place], [address] Phone: [phone]   Fax:  [phone]  Physical Therapy Evaluation  Patient Details  Name: Connie Francis MRN: [id number] Date of Birth: 20-Nov-1970 No data recorded  Encounter Date: 02/21/2020   PT End of Session - 02/21/20 0852    Visit Number 1    Number of Visits 17    Date for PT Re-Evaluation 04/17/20    PT Start Time 0800    PT Stop Time 0838    PT Time Calculation (min) 38 min    Activity Tolerance Patient tolerated treatment well    Behavior During Therapy [facility] for tasks assessed/performed           Past Medical History:  Diagnosis Date  . Abdominal bloating   . Anxiety   . 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 CHF (congestive heart failure) ([facility]) 2006  . Chronic epigastric pain   . Constipation   . GERD (gastroesophageal reflux disease)   . Hiatal hernia 2017  . Hypertension   . IBS (irritable bowel syndrome)   . Kidney stones   . Lump or mass in breast    R-Breast  . Microscopic hematuria 10/05/2017  . Migraine   . Obsessive compulsive disorder   . 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 Psychogenic dyspepsia 11/27/2012  . Thyroid disease    hyper    Past Surgical History:  Procedure Laterality Date  . BREAST CYST ASPIRATION Left    negative 2010  . CESAREAN SECTION    . COLONOSCOPY WITH PROPOFOL N/A 07/27/2014   Procedure: COLONOSCOPY WITH PROPOFOL;  Surgeon: Midge Minium, MD;  Location[facility];  Service: Endoscopy;  Laterality: N/A;  . colonscopy  07/27/2015  . DILATION AND CURETTAGE OF UTERUS  2006   complicated by heart failure  . ESOPHAGOGASTRODUODENOSCOPY (EGD) WITH PROPOFOL N/A 07/27/2014   Procedure: ESOPHAGOGASTRODUODENOSCOPY (EGD) WITH PROPOFOL;  Surgeon: Midge Minium, MD;  Location: [facility];  Service: Endoscopy;  Laterality: N/A;  . TUBAL LIGATION    . UPPER ENDOSCOPY W/ ESOPHAGEAL  MANOMETRY    . VAGINAL HYSTERECTOMY      There were no vitals filed for this visit.    Subjective Assessment - 02/21/20 0804    Pertinent History pt is a 49y.o female with c/c of throacic back pain and some neck pain with the objective of being cleared for breast reduction surgey. Pain has been going on for about 10-15 years with no prior injury to area. Curently pain is 0/10 with worst 6/10. Pain is  aggrevated with standing and sitting for prolong periods.Pt reports grooves in shoulders from bra straps despite efforts to find most supportive clothing. Pt is able to alleviate pain with switching postitions, warm showers,  rest, and meds as needed. Pain is better in the morning and gets worse throughout day depending on amount of activity. 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 Denies any N/T, B/B, changes in sensation, night pains, or weight fluctuation.    Limitations House hold activities;Sitting;Lifting;Standing;Walking    How long can you sit comfortably? 30 min    How long can you stand comfortably? 1 hour    How long can you walk comfortably? 1 hour    Diagnostic tests Xrays negative    Patient Stated Goals Would like to participate in activites pain free.    Currently in Pain? Yes  Pain Score 0-No pain    Pain Location Back    Pain Orientation Upper    Pain Descriptors / Indicators Dull;Aching    Pain Type Chronic pain    Pain Onset More than a month ago    Pain Frequency Intermittent    Aggravating Factors  Sitting and standing    Pain Relieving Factors Rest, warm showers, positional change, med prn    Effect of Pain on Daily Activities unable to perform ADLs and occupation at full capicity    Multiple Pain Sites No           Eval     Posture/Gait - Increased thoracic kyphosis w/ forward head and rounded shoulder posture in sitting and during walking. Pt able to correct with verbal cues.  Palpation/Sensation -   Muscle tension along bilateral upper trap and along medial border of scapula. Some indention along top of shoulders due to bra strap.   FUNCTIONAL TEST -  Reaching overhead as if grabbing something off shelf  REPEATED MOTIONS - not tested; no concerns centralization/peripheralization  AROM/OP  THORACIC FLEX - WNL EXT - WNL ROTATION - WNL SIDEBEND - WNL  CERVICAL FLEX - WNL  EXT - WNL  ROTATION - WNL  SIDEBENDING - WNL   SHOULDER FLEX - WNL ABD -  WNL EXT - WNL ER - WNL IR - WNL   PROM/OP THORACIC FLEX - WNL EXT - WNL ROTATION - WNL SIDEBEND - WNL   CERVICAL FLEX - WNL  EXT - WNL  ROTATION - WNL  SIDEBENDING - WNL   SHOULDER FLEX - WNL ABD - WNL EXT - WNL ER - WNL IR - WNL    PAIVM/PPIVM/PAM - Hypomobile along thoracic vertebrae; no concerns    MUSCLE LENGTH TEST  Tension along bilateral upper trap and levator scap   STRENGTH  SHOULDER FLEX - 5 EXT - 5  IR - 5 ER - 5  PERISCAPULAR T L/R - 4 Y L/R  - 4-    SPECIAL TESTS Not tested; no concerns of other pathology       Therex     PT reviewed the following HEP with patient with patient able to demonstrate a set of the following with min cuing for correction needed. PT educated patient on parameters of therex; how/when to inc/decrease intensity, frequency, rep/set range, and purpose of therex with verbalized understanding from patient  Prone elbow press up - x15 -20 1x day 2-3 d/wk  GTB overhead scaption 3x 10 1x day 2-3d/wk  GTB horizontal abd  3x 10 1x day 2-3d/wk   UT stretch x30 sec 1x day 2-3d/wk               Objective measurements completed on examination: See above findings.               PT Education - 02/21/20 0817    Education Details PT educated pt on HEP, Diagnosis/Prognosis    Person(s) Educated Patient    Methods Explanation;Demonstration;Verbal cues    Comprehension Verbalized understanding;Returned demonstration;Verbal cues required             PT Short Term Goals - 02/21/20 0907      PT SHORT TERM GOAL #1   Title Pt will be independent with HEP to increase periscapular strength and improve posture to decrease pain with household ADLs    Baseline 02/21/20 HEP given    Time 4    Period Weeks    Status New  PT Long Term Goals - 02/21/20 0910      PT LONG TERM GOAL #1   Title Pt will decrease worst pain level by at least 3 points on NPRS to demonstrate clinically significant reduction in thoracic back pain.    Baseline 02/21/20 6/10    Time 8    Period Weeks    Status New      PT LONG TERM GOAL #2   Title Pt will demonstrate periscapular MMT of 4+ to demonstrate increase in strength for household ADLs and community activites.    Baseline 02/21/20 L/R T - 4; Y - 4-    Time 8    Period Weeks    Status New      PT LONG TERM GOAL #3   Title Pt will increase FOTO to 71 to demonstrate predicted increase in functional mobility to perform household ADLs and community activity.    Baseline 02/21/20 67    Time 8    Period Days    Status New                  Plan - 02/21/20 0853    Clinical Impression Statement Pt is a 50 y.o female with c/c of thoracic back pain which has been active for about 15 years with no prior history of injury. Examination reveals periscapular weakness, pain, increased muscle tension (bilat UT, and levators) and postural deficits. 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 Pt with desires of breast reduction, which seems probable due to grooves in shoulders, despite attempting supportive bras and clothing, and pain reduction when breasts can be off loaded/supported. Pt may benefit from breast reduction surgery and skilled PT to address these impairments to return to prior level of function.    Personal Factors and Comorbidities Comorbidity  2;Profession;Fitness;Age    Examination-Activity Limitations Stand;Sit    Examination-Participation Restrictions Community Activity;Occupation;Cleaning;Laundry    Stability/Clinical Decision Making Evolving/Moderate complexity    Clinical Decision Making Moderate    Rehab Potential Good    PT Frequency 2x / week    PT Duration 8 weeks    PT Treatment/Interventions ADLs/Self Care Home Management;Electrical Stimulation;Moist Heat;Cryotherapy;Iontophoresis 4mg /ml Dexamethasone;Functional mobility training;Neuromuscular re-education;Therapeutic exercise;Therapeutic activities;Patient/family education;Manual techniques;Dry needling;Passive range of motion;Energy conservation;Joint Manipulations;Spinal Manipulations    PT Next Visit Plan HEP review, Plan of care    PT Home Exercise Plan Prone press ups; GTB overhead Y & pull aparts    Consulted and Agree with Plan of Care Patient           Patient will benefit from skilled therapeutic intervention in order to improve the following deficits and impairments:  Decreased strength,Hypomobility,Decreased mobility,Decreased range of motion,Impaired flexibility,Postural dysfunction,Improper body mechanics,Pain,Decreased activity tolerance,Decreased endurance  Visit Diagnosis: Chronic bilateral thoracic back pain     Problem List Patient Active Problem List   Diagnosis Date Noted  . Breast hypertrophy in female 11/21/2019  . Muscle cramps 06/10/2019  . Colicky RUQ abdominal pain 02/04/2019  . Night sweats 11/15/2018  . Long-term use of high-risk medication 08/13/2018  . Back pain 10/05/2017  . Chronic constipation 12/17/2016  . Endometriosis 12/17/2016  . Recurrent headache 01/06/2016  . IBS (irritable bowel syndrome) 07/17/2014  . Status post [facility] 07/15/2014  . Cyst (solitary) of breast 07/12/2013  . Essential hypertension 01/18/2013  . Neck pain 08/14/2012  . Visit for preventive health examination 06/18/2012  . Acquired cyst of kidney  02/18/2012  . H/O urinary stone 02/18/2012  . Depression with anxiety 01/07/2012  . Obsessive compulsive disorder     Durwin Reges DPT Turner Daniels, SPT  Durwin Reges 02/21/2020, 9:45 AM  [place] [facility] [address] [place], [address] Phone: [phone]   Fax:  [phone]  Name: Connie Francis MRN: [id number] Date of Birth: [birth year]

## 2020-02-24 ENCOUNTER — Encounter: Payer: Self-pay | Admitting: Physical Therapy

## 2020-02-24 ENCOUNTER — Ambulatory Visit: Payer: No Typology Code available for payment source | Admitting: Physical Therapy

## 2020-02-24 ENCOUNTER — Other Ambulatory Visit: Payer: Self-pay

## 2020-02-24 DIAGNOSIS — M546 Pain in thoracic spine: Secondary | ICD-10-CM | POA: Diagnosis not present

## 2020-02-24 DIAGNOSIS — G8929 Other chronic pain: Secondary | ICD-10-CM

## 2020-02-24 NOTE — Therapy (Signed)
[facility] [address] [place], [address] Phone: [phone]   Fax:  [phone]  Physical Therapy Treatment  Patient Details  Name: Connie Francis MRN: [id number] Date of Birth: 1970/11/18 No data recorded  Encounter Date: 02/24/2020   PT End of Session - 02/24/20 1422    Visit Number 2    Number of Visits 17    Date for PT Re-Evaluation 04/17/20    PT Start Time 4128    PT Stop Time 1457    PT Time Calculation (min) 40 min    Activity Tolerance Patient tolerated treatment well    Behavior During Therapy [facility] for tasks assessed/performed           Past Medical History:  Diagnosis Date  . Abdominal bloating   . Anxiety   . 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 CHF (congestive heart failure) ([place]) 2006  . Chronic epigastric pain   . Constipation   . GERD (gastroesophageal reflux disease)   . Hiatal hernia 2017  . Hypertension   . IBS (irritable bowel syndrome)   . Kidney stones   . Lump or mass in breast    R-Breast  . Microscopic hematuria 10/05/2017  . Migraine   . Obsessive compulsive disorder   . 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 Psychogenic dyspepsia 11/27/2012  . Thyroid disease    hyper    Past Surgical History:  Procedure Laterality Date  . BREAST CYST ASPIRATION Left    negative 2010  . CESAREAN SECTION    . COLONOSCOPY WITH PROPOFOL N/A 07/27/2014   Procedure: COLONOSCOPY WITH PROPOFOL;  Surgeon: Lucilla Lame, MD;  Location: [place];  Service: Endoscopy;  Laterality: N/A;  . colonscopy  07/27/2015  . DILATION AND CURETTAGE OF UTERUS  7867   complicated by heart failure  . ESOPHAGOGASTRODUODENOSCOPY (EGD) WITH PROPOFOL N/A 07/27/2014   Procedure: ESOPHAGOGASTRODUODENOSCOPY (EGD) WITH PROPOFOL;  Surgeon: Lucilla Lame, MD;  Location: [place];  Service: Endoscopy;  Laterality: N/A;  . TUBAL LIGATION    . UPPER ENDOSCOPY W/ ESOPHAGEAL  MANOMETRY    . VAGINAL HYSTERECTOMY      There were no vitals filed for this visit.   Subjective Assessment - 02/24/20 1419    Subjective Pt reports no pain currently and compliance with HEP. Reports that the HEP has helped with managing pain.    Pertinent History pt is a 50y.o female with c/c of throacic back pain and some neck pain with the objective of being cleared for breast reduction surgey. Pain has been going on for about 10-15 years with no prior injury to area. Curently pain is 0/10 with worst 6/10. Pain is  aggrevated with standing and sitting for prolong periods.Pt reports grooves in shoulders from bra straps despite efforts to find most supportive clothing. Pt is able to alleviate pain with switching postitions, warm showers,  rest, and meds as needed. Pain is better in the morning and gets worse throughout day depending on amount of activity. 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 Denies any N/T, B/B, changes in sensation, night pains, or weight fluctuation.    Limitations House hold activities;Sitting;Lifting;Standing;Walking    How long can you sit comfortably? 30 min    How long can you stand comfortably? 1 hour    How long can you walk comfortably? 1 hour    Diagnostic tests Xrays negative  Patient Stated Goals Would like to participate in activites pain free.    Pain Onset More than a month ago           Therex  Nustep LE 7 UE 9 level 4 5 min for gentle motion and strengthening  Seated overhead scaption GTB 3x 12; towel roll behind up back to promoted thoracic ext; min cuing for scapular retraction   Seated horizontal abd GTB 3x 12  Seated Arnold press 3x10; cuing to maintain scapular retraction through movement with good carry over.  Standing row 10# 3x 10; min cuing to prevent shoulder hike  Lat pull down 10# x10; 20# 2x10   Overhead scaption into depression standing against wall w/ GTB x15  UT  stretch bilat x30                           PT Education - 02/24/20 1422    Education Details therex form/technique, HEP review    Person(s) Educated Patient    Methods Explanation;Demonstration;Verbal cues    Comprehension Verbalized understanding;Returned demonstration;Verbal cues required            PT Short Term Goals - 02/21/20 0907      PT SHORT TERM GOAL #1   Title Pt will be independent with HEP to increase periscapular strength and improve posture to decrease pain with household ADLs    Baseline 02/21/20 HEP given    Time 4    Period Weeks    Status New             PT Long Term Goals - 02/21/20 0910      PT LONG TERM GOAL #1   Title Pt will decrease worst pain level by at least 3 points on NPRS to demonstrate clinically significant reduction in thoracic back pain.    Baseline 02/21/20 6/10    Time 8    Period Weeks    Status New      PT LONG TERM GOAL #2   Title Pt will demonstrate periscapular MMT of 4+ to demonstrate increase in strength for household ADLs and community activites.    Baseline 02/21/20 L/R T - 4; Y - 4-    Time 8    Period Weeks    Status New      PT LONG TERM GOAL #3   Title Pt will increase FOTO to 71 to demonstrate predicted increase in functional mobility to perform household ADLs and community activity.    Baseline 02/21/20 67    Time 8    Period Days    Status New                 Plan - 02/24/20 1427    Clinical Impression Statement PT initiated therex progression for increased  upper back and periscapular strength for carry over into overhead functional movements and sitting/standing posture. Pt was able to perform all therex with proper technique following demonstration and with min cuing. pt had no increase pain and good motivation through out session. PT will continue to progress as able.    Personal Factors and Comorbidities Comorbidity 2;Profession;Fitness;Age    Examination-Activity Limitations  Stand;Sit    Examination-Participation Restrictions Community Activity;Occupation;Cleaning;Laundry    Stability/Clinical Decision Making Evolving/Moderate complexity    Clinical Decision Making Moderate    Rehab Potential Good    PT Frequency 2x / week    PT Duration 8 weeks    PT Treatment/Interventions ADLs/Self Care Home Management;Electrical  Stimulation;Moist Heat;Cryotherapy;Iontophoresis 4mg /ml Dexamethasone;Functional mobility training;Neuromuscular re-education;Therapeutic exercise;Therapeutic activities;Patient/family education;Manual techniques;Dry needling;Passive range of motion;Energy conservation;Joint Manipulations;Spinal Manipulations    PT Next Visit Plan HEP review, Plan of care    PT Home Exercise Plan Prone press ups; GTB overhead Y & pull aparts    Consulted and Agree with Plan of Care Patient           Patient will benefit from skilled therapeutic intervention in order to improve the following deficits and impairments:  Decreased strength,Hypomobility,Decreased mobility,Decreased range of motion,Impaired flexibility,Postural dysfunction,Improper body mechanics,Pain,Decreased activity tolerance,Decreased endurance  Visit Diagnosis: Chronic bilateral thoracic back pain     Problem List Patient Active Problem List   Diagnosis Date Noted  . Breast hypertrophy in female 11/21/2019  . Muscle cramps 06/10/2019  . Colicky RUQ abdominal pain 02/04/2019  . Night sweats 11/15/2018  . Long-term use of high-risk medication 08/13/2018  . Back pain 10/05/2017  . Chronic constipation 12/17/2016  . Endometriosis 12/17/2016  . Recurrent headache 01/06/2016  . IBS (irritable bowel syndrome) 07/17/2014  . Status post [facility] 07/15/2014  . Cyst (solitary) of breast 07/12/2013  . Essential hypertension 01/18/2013  . Neck pain 08/14/2012  . Visit for preventive health examination 06/18/2012  . Acquired cyst of kidney 02/18/2012  . H/O urinary stone 02/18/2012  . Depression with  anxiety 01/07/2012  . Obsessive compulsive disorder    Durwin Reges DPT Durwin Reges 02/24/2020, 5:07 PM  [facility] [address] [place], [address] Phone: [phone]   Fax:  [phone]  Name: Connie Francis MRN: [id number] Date of Birth: 04-05-1970

## 2020-02-28 ENCOUNTER — Other Ambulatory Visit: Payer: Self-pay

## 2020-02-28 ENCOUNTER — Encounter: Payer: Self-pay | Admitting: Physical Therapy

## 2020-02-28 ENCOUNTER — Ambulatory Visit: Payer: No Typology Code available for payment source | Admitting: Physical Therapy

## 2020-02-28 DIAGNOSIS — M546 Pain in thoracic spine: Secondary | ICD-10-CM | POA: Diagnosis not present

## 2020-02-28 DIAGNOSIS — G8929 Other chronic pain: Secondary | ICD-10-CM

## 2020-02-28 NOTE — Therapy (Signed)
[facility] [address], [place], [address] Phone: [phone]   Fax:  [phone]  Physical Therapy Treatment  Patient Details  Name: Connie Francis MRN: [id number] Date of Birth: 1970/07/04 No data recorded  Encounter Date: 02/28/2020   PT End of Session - 02/28/20 0820    Visit Number 3    Number of Visits 17    Date for PT Re-Evaluation 04/17/20    Authorization - Visit Number 3    Authorization - Number of Visits 17    PT Start Time 0815    PT Stop Time 6283    PT Time Calculation (min) 38 min    Activity Tolerance Patient tolerated treatment well    Behavior During Therapy [facility] for tasks assessed/performed           Past Medical History:  Diagnosis Date  . Abdominal bloating   . Anxiety   . 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 CHF (congestive heart failure) ([place]) 2006  . Chronic epigastric pain   . Constipation   . GERD (gastroesophageal reflux disease)   . Hiatal hernia 2017  . Hypertension   . IBS (irritable bowel syndrome)   . Kidney stones   . Lump or mass in breast    R-Breast  . Microscopic hematuria 10/05/2017  . Migraine   . Obsessive compulsive disorder   . 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 Psychogenic dyspepsia 11/27/2012  . Thyroid disease    hyper    Past Surgical History:  Procedure Laterality Date  . BREAST CYST ASPIRATION Left    negative 2010  . CESAREAN SECTION    . COLONOSCOPY WITH PROPOFOL N/A 07/27/2014   Procedure: COLONOSCOPY WITH PROPOFOL;  Surgeon: Lucilla Lame, MD;  Location: [place];  Service: Endoscopy;  Laterality: N/A;  . colonscopy  07/27/2015  . DILATION AND CURETTAGE OF UTERUS  6629   complicated by heart failure  . ESOPHAGOGASTRODUODENOSCOPY (EGD) WITH PROPOFOL N/A 07/27/2014   Procedure: ESOPHAGOGASTRODUODENOSCOPY (EGD) WITH PROPOFOL;  Surgeon: Lucilla Lame, MD;  Location: [place];  Service: Endoscopy;   Laterality: N/A;  . TUBAL LIGATION    . UPPER ENDOSCOPY W/ ESOPHAGEAL MANOMETRY    . VAGINAL HYSTERECTOMY      There were no vitals filed for this visit.   Subjective Assessment - 02/28/20 0815    Subjective Pt reports no pain currently, still only during prolonged activites in standing or sitting. Continued HEP compliance.    Pertinent History pt is a 49y.o female with c/c of throacic back pain and some neck pain with the objective of being cleared for breast reduction surgey. Pain has been going on for about 10-15 years with no prior injury to area. Curently pain is 0/10 with worst 6/10. Pain is  aggrevated with standing and sitting for prolong periods.Pt reports grooves in shoulders from bra straps despite efforts to find most supportive clothing. Pt is able to alleviate pain with switching postitions, warm showers,  rest, and meds as needed. Pain is better in the morning and gets worse throughout day depending on amount of activity. 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 Denies any N/T, B/B, changes in sensation, night pains, or weight fluctuation.    Limitations House hold activities;Sitting;Lifting;Standing;Walking    How long can you sit comfortably? 30 min    How long can you stand comfortably? 1 hour    How  long can you walk comfortably? 1 hour    Diagnostic tests Xrays negative    Patient Stated Goals Would like to participate in activites pain free.    Pain Onset More than a month ago           Therex   Nustep LE 6 UE 9 level 4 5 min for gentle motion and strengthening   Seated overhead scaption GTB 3x 12; towel roll behind up back to promoted thoracic ext; min cuing for scapular retraction    Seated horizontal abd GTB 3x 12   Seated military press 3x10 3#DB; cuing to maintain scapular retraction through movement with good carry over  TRX row 3x12;   Overhead Y facing wall 3x10; min cuing to maintain elbow ext     Lat pull down 20# x10; 25# 2x8                           PT Education - 02/28/20 0819    Education Details therex form/technique, HEP review    Person(s) Educated Patient    Methods Explanation;Demonstration;Verbal cues    Comprehension Verbalized understanding;Returned demonstration;Verbal cues required            PT Short Term Goals - 02/21/20 0907      PT SHORT TERM GOAL #1   Title Pt will be independent with HEP to increase periscapular strength and improve posture to decrease pain with household ADLs    Baseline 02/21/20 HEP given    Time 4    Period Weeks    Status New             PT Long Term Goals - 02/21/20 0910      PT LONG TERM GOAL #1   Title Pt will decrease worst pain level by at least 3 points on NPRS to demonstrate clinically significant reduction in thoracic back pain.    Baseline 02/21/20 6/10    Time 8    Period Weeks    Status New      PT LONG TERM GOAL #2   Title Pt will demonstrate periscapular MMT of 4+ to demonstrate increase in strength for household ADLs and community activites.    Baseline 02/21/20 L/R T - 4; Y - 4-    Time 8    Period Weeks    Status New      PT LONG TERM GOAL #3   Title Pt will increase FOTO to 71 to demonstrate predicted increase in functional mobility to perform household ADLs and community activity.    Baseline 02/21/20 67    Time 8    Period Days    Status New                 Plan - 02/28/20 2409    Clinical Impression Statement PT continued therex progression for increased upper back and periscapular strengh for carry over into overhead functional movements and posture. Pt was able to perform therex with proper technique following demonstration and min cuing. Pt had no increases in pain and continued good motivation throughout session. PT will progress as able.    Personal Factors and Comorbidities Comorbidity 2;Profession;Fitness;Age    Examination-Activity Limitations Stand;Sit     Examination-Participation Restrictions Community Activity;Occupation;Cleaning;Laundry    Stability/Clinical Decision Making Evolving/Moderate complexity    Clinical Decision Making Moderate    Rehab Potential Good    PT Frequency 2x / week    PT Duration 8 weeks  PT Treatment/Interventions ADLs/Self Care Home Management;Electrical Stimulation;Moist Heat;Cryotherapy;Iontophoresis 4mg /ml Dexamethasone;Functional mobility training;Neuromuscular re-education;Therapeutic exercise;Therapeutic activities;Patient/family education;Manual techniques;Dry needling;Passive range of motion;Energy conservation;Joint Manipulations;Spinal Manipulations    PT Next Visit Plan HEP review, Plan of care    PT Home Exercise Plan Prone press ups; GTB overhead Y & pull aparts    Consulted and Agree with Plan of Care Patient           Patient will benefit from skilled therapeutic intervention in order to improve the following deficits and impairments:  Decreased strength,Hypomobility,Decreased mobility,Decreased range of motion,Impaired flexibility,Postural dysfunction,Improper body mechanics,Pain,Decreased activity tolerance,Decreased endurance  Visit Diagnosis: Chronic bilateral thoracic back pain     Problem List Patient Active Problem List   Diagnosis Date Noted  . Breast hypertrophy in female 11/21/2019  . Muscle cramps 06/10/2019  . Colicky RUQ abdominal pain 02/04/2019  . Night sweats 11/15/2018  . Long-term use of high-risk medication 08/13/2018  . Back pain 10/05/2017  . Chronic constipation 12/17/2016  . Endometriosis 12/17/2016  . Recurrent headache 01/06/2016  . IBS (irritable bowel syndrome) 07/17/2014  . Status post [facility] 07/15/2014  . Cyst (solitary) of breast 07/12/2013  . Essential hypertension 01/18/2013  . Neck pain 08/14/2012  . Visit for preventive health examination 06/18/2012  . Acquired cyst of kidney 02/18/2012  . H/O urinary stone 02/18/2012  . Depression with anxiety  01/07/2012  . Obsessive compulsive disorder      Durwin Reges DPT Turner Daniels, SPT  Durwin Reges 02/28/2020, 11:49 AM  [facility] [address] [place], [address] Phone: [phone]   Fax:  [phone]  Name: Connie Francis MRN: [id number] Date of Birth: 10/29/1970

## 2020-03-01 ENCOUNTER — Ambulatory Visit: Payer: No Typology Code available for payment source | Admitting: Physical Therapy

## 2020-03-01 ENCOUNTER — Encounter: Payer: Self-pay | Admitting: Physical Therapy

## 2020-03-01 ENCOUNTER — Other Ambulatory Visit: Payer: Self-pay

## 2020-03-01 DIAGNOSIS — M546 Pain in thoracic spine: Secondary | ICD-10-CM | POA: Diagnosis not present

## 2020-03-01 DIAGNOSIS — G8929 Other chronic pain: Secondary | ICD-10-CM

## 2020-03-01 NOTE — Therapy (Signed)
[facility] [address] [place], [address] Phone: [phone]   Fax:  [phone]  Physical Therapy Treatment  Patient Details  Name: Connie Francis MRN: [id number] Date of Birth: 1970-03-09 No data recorded  Encounter Date: 03/01/2020   PT End of Session - 03/01/20 0821    Visit Number 4    Number of Visits 17    Date for PT Re-Evaluation 04/17/20    Authorization - Visit Number 4    Authorization - Number of Visits 17    PT Start Time [phone]    PT Stop Time 0900    PT Time Calculation (min) 44 min    Activity Tolerance Patient tolerated treatment well    Behavior During Therapy [facility] for tasks assessed/performed           Past Medical History:  Diagnosis Date  . Abdominal bloating   . Anxiety   . 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 CHF (congestive heart failure) ([place]) 2006  . Chronic epigastric pain   . Constipation   . GERD (gastroesophageal reflux disease)   . Hiatal hernia 2017  . Hypertension   . IBS (irritable bowel syndrome)   . Kidney stones   . Lump or mass in breast    R-Breast  . Microscopic hematuria 10/05/2017  . Migraine   . Obsessive compulsive disorder   . 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 Psychogenic dyspepsia 11/27/2012  . Thyroid disease    hyper    Past Surgical History:  Procedure Laterality Date  . BREAST CYST ASPIRATION Left    negative 2010  . CESAREAN SECTION    . COLONOSCOPY WITH PROPOFOL N/A 07/27/2014   Procedure: COLONOSCOPY WITH PROPOFOL;  Surgeon: Lucilla Lame, MD;  Location: [place];  Service: Endoscopy;  Laterality: N/A;  . colonscopy  07/27/2015  . DILATION AND CURETTAGE OF UTERUS  0086   complicated by heart failure  . ESOPHAGOGASTRODUODENOSCOPY (EGD) WITH PROPOFOL N/A 07/27/2014   Procedure: ESOPHAGOGASTRODUODENOSCOPY (EGD) WITH PROPOFOL;  Surgeon: Lucilla Lame, MD;  Location: [place];  Service: Endoscopy;   Laterality: N/A;  . TUBAL LIGATION    . UPPER ENDOSCOPY W/ ESOPHAGEAL MANOMETRY    . VAGINAL HYSTERECTOMY      There were no vitals filed for this visit.   Subjective Assessment - 03/01/20 0819    Subjective Reports she feels some relief with postural exercises and does not feel as tight. Is still getting pain from bra strap digging into shoulder    Pertinent History pt is a 49y.o female with c/c of throacic back pain and some neck pain with the objective of being cleared for breast reduction surgey. Pain has been going on for about 10-15 years with no prior injury to area. Curently pain is 0/10 with worst 6/10. Pain is  aggrevated with standing and sitting for prolong periods.Pt reports grooves in shoulders from bra straps despite efforts to find most supportive clothing. Pt is able to alleviate pain with switching postitions, warm showers,  rest, and meds as needed. Pain is better in the morning and gets worse throughout day depending on amount of activity. 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 Denies any N/T, B/B, changes in sensation, night pains, or weight fluctuation.    Limitations House hold activities;Sitting;Lifting;Standing;Walking    How long can you sit comfortably? 30 min    How long can you stand  comfortably? 1 hour    How long can you walk comfortably? 1 hour    Patient Stated Goals Would like to participate in activites pain free.                Therex  Nustep LE 6 UE 9 level 4 5 min for gentle motion and strengthening  Standing rows 15# 3x 10 with min cuing for technique with good carry over  Lat pull down 25# 3x 10 with min cuing for eccentric control  Seated military press 3x10 3#DB; cuing to maintain scapular retraction through movement with good carry over  Prone Y 3x 10 with min cuing for scapular retraction + depression with good carry over  Seated overhead scaption BluTB 3x 10; towel roll  behind up back to promoted thoracic ext; min cuing for scapular retraction                  PT Education - 03/01/20 0820    Education Details therex form/technique    Person(s) Educated Patient    Methods Explanation;Demonstration;Verbal cues    Comprehension Verbalized understanding;Returned demonstration;Verbal cues required            PT Short Term Goals - 02/21/20 0907      PT SHORT TERM GOAL #1   Title Pt will be independent with HEP to increase periscapular strength and improve posture to decrease pain with household ADLs    Baseline 02/21/20 HEP given    Time 4    Period Weeks    Status New             PT Long Term Goals - 02/21/20 0910      PT LONG TERM GOAL #1   Title Pt will decrease worst pain level by at least 3 points on NPRS to demonstrate clinically significant reduction in thoracic back pain.    Baseline 02/21/20 6/10    Time 8    Period Weeks    Status New      PT LONG TERM GOAL #2   Title Pt will demonstrate periscapular MMT of 4+ to demonstrate increase in strength for household ADLs and community activites.    Baseline 02/21/20 L/R T - 4; Y - 4-    Time 8    Period Weeks    Status New      PT LONG TERM GOAL #3   Title Pt will increase FOTO to 71 to demonstrate predicted increase in functional mobility to perform household ADLs and community activity.    Baseline 02/21/20 67    Time 8    Period Days    Status New                 Plan - 03/01/20 0849    Clinical Impression Statement PT continued therex progression for increased postural strength with success. Patient is demonstrating excellent carry over of neutral posture with good carry over of all cuing for proper technique of therex. Remaining pain that patient has is soley from bra straps digging into shoulders at end of the day. Pain is resolved with supporting breasts. PT continues to advise that patient requested breast reduction would be helpful in reducing pain and  increasing function. In lieu of other pain locations, and demonstrated increased postural awareness, and strength; this seems to be a viable option at this time. PT will continue progression as able, and advises follow up with [facility] for reduction.    Personal Factors and Comorbidities Comorbidity 2;Profession;Fitness;Age  Examination-Activity Limitations Stand;Sit    Examination-Participation Restrictions Community Activity;Occupation;Cleaning;Laundry    Stability/Clinical Decision Making Evolving/Moderate complexity    Clinical Decision Making Moderate    Rehab Potential Good    PT Frequency 2x / week    PT Duration 8 weeks    PT Treatment/Interventions ADLs/Self Care Home Management;Electrical Stimulation;Moist Heat;Cryotherapy;Iontophoresis 4mg /ml Dexamethasone;Functional mobility training;Neuromuscular re-education;Therapeutic exercise;Therapeutic activities;Patient/family education;Manual techniques;Dry needling;Passive range of motion;Energy conservation;Joint Manipulations;Spinal Manipulations    PT Next Visit Plan HEP review, Plan of care    PT Home Exercise Plan Prone press ups; GTB overhead Y & pull aparts    Consulted and Agree with Plan of Care Patient           Patient will benefit from skilled therapeutic intervention in order to improve the following deficits and impairments:  Decreased strength,Hypomobility,Decreased mobility,Decreased range of motion,Impaired flexibility,Postural dysfunction,Improper body mechanics,Pain,Decreased activity tolerance,Decreased endurance  Visit Diagnosis: Chronic bilateral thoracic back pain     Problem List Patient Active Problem List   Diagnosis Date Noted  . Breast hypertrophy in female 11/21/2019  . Muscle cramps 06/10/2019  . Colicky RUQ abdominal pain 02/04/2019  . Night sweats 11/15/2018  . Long-term use of high-risk medication 08/13/2018  . Back pain 10/05/2017  . Chronic constipation 12/17/2016  . Endometriosis  12/17/2016  . Recurrent headache 01/06/2016  . IBS (irritable bowel syndrome) 07/17/2014  . Status post [facility] 07/15/2014  . Cyst (solitary) of breast 07/12/2013  . Essential hypertension 01/18/2013  . Neck pain 08/14/2012  . Visit for preventive health examination 06/18/2012  . Acquired cyst of kidney 02/18/2012  . H/O urinary stone 02/18/2012  . Depression with anxiety 01/07/2012  . Obsessive compulsive disorder    Durwin Reges DPT Durwin Reges 03/01/2020, 9:22 AM  [facility] [address], [place], [address] Phone: [phone]   Fax:  [phone]  Name: Connie Francis MRN: [id number] Date of Birth: February 08, 1970

## 2020-03-08 ENCOUNTER — Encounter: Payer: Self-pay | Admitting: Plastic Surgery

## 2020-03-09 ENCOUNTER — Encounter: Payer: Self-pay | Admitting: Physical Therapy

## 2020-03-09 ENCOUNTER — Ambulatory Visit: Payer: No Typology Code available for payment source | Admitting: Physical Therapy

## 2020-03-09 ENCOUNTER — Other Ambulatory Visit: Payer: Self-pay

## 2020-03-09 DIAGNOSIS — G8929 Other chronic pain: Secondary | ICD-10-CM

## 2020-03-09 DIAGNOSIS — M546 Pain in thoracic spine: Secondary | ICD-10-CM | POA: Diagnosis not present

## 2020-03-09 NOTE — Therapy (Signed)
[facility] [address] [place], [address] Phone: [phone]   Fax:  [phone]  Physical Therapy Treatment  Patient Details  Name: Connie Francis MRN: [id number] Date of Birth: 48-Jul-1972 No data recorded  Encounter Date: 03/09/2020   PT End of Session - 03/09/20 0904    Visit Number 5    Number of Visits 17    Date for PT Re-Evaluation 04/17/20    Authorization - Visit Number 5    Authorization - Number of Visits 17    PT Start Time 0900    PT Stop Time 0943    PT Time Calculation (min) 43 min    Activity Tolerance Patient tolerated treatment well    Behavior During Therapy [facility] for tasks assessed/performed           Past Medical History:  Diagnosis Date  . Abdominal bloating   . Anxiety   . 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 CHF (congestive heart failure) ([place]) 2006  . Chronic epigastric pain   . Constipation   . GERD (gastroesophageal reflux disease)   . Hiatal hernia 2017  . Hypertension   . IBS (irritable bowel syndrome)   . Kidney stones   . Lump or mass in breast    R-Breast  . Microscopic hematuria 10/05/2017  . Migraine   . Obsessive compulsive disorder   . 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 Psychogenic dyspepsia 11/27/2012  . Thyroid disease    hyper    Past Surgical History:  Procedure Laterality Date  . BREAST CYST ASPIRATION Left    negative 2010  . CESAREAN SECTION    . COLONOSCOPY WITH PROPOFOL N/A 07/27/2014   Procedure: COLONOSCOPY WITH PROPOFOL;  Surgeon: Lucilla Lame, MD;  Location: [facility];  Service: Endoscopy;  Laterality: N/A;  . colonscopy  07/27/2015  . DILATION AND CURETTAGE OF UTERUS  9381   complicated by heart failure  . ESOPHAGOGASTRODUODENOSCOPY (EGD) WITH PROPOFOL N/A 07/27/2014   Procedure: ESOPHAGOGASTRODUODENOSCOPY (EGD) WITH PROPOFOL;  Surgeon: Lucilla Lame, MD;  Location: [place];  Service: Endoscopy;   Laterality: N/A;  . TUBAL LIGATION    . UPPER ENDOSCOPY W/ ESOPHAGEAL MANOMETRY    . VAGINAL HYSTERECTOMY      There were no vitals filed for this visit.   Subjective Assessment - 03/09/20 0900    Subjective Reports she is doing well, still getting pain with prolonged periods of standing/sitting.    Pertinent History pt is a 50y.o female with c/c of throacic back pain and some neck pain with the objective of being cleared for breast reduction surgey. Pain has been going on for about 10-15 years with no prior injury to area. Curently pain is 0/10 with worst 6/10. Pain is  aggrevated with standing and sitting for prolong periods.Pt reports grooves in shoulders from bra straps despite efforts to find most supportive clothing. Pt is able to alleviate pain with switching postitions, warm showers,  rest, and meds as needed. Pain is better in the morning and gets worse throughout day depending on amount of activity. 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 Denies any N/T, B/B, changes in sensation, night pains, or weight fluctuation.    Limitations House hold activities;Sitting;Lifting;Standing;Walking    How long can you sit comfortably? 30 min    How long can you stand comfortably? 1 hour    How long can you walk  comfortably? 1 hour    Diagnostic tests Xrays negative    Patient Stated Goals Would like to participate in activites pain free.    Pain Onset More than a month ago             Therex   Nustep LE 6 UE 9 level 4 5 min for gentle motion and strengthening   Standing rows 15# 3x 10 with min cuing for technique with good carry over   Lat pull down 25# 3x 10 with min cuing for eccentric control   Seated arnold press 3x10 3#DB; cuing to maintain scapular retraction through movement with good carry over   Prone T bilat 3x 10 2#DB w/ UE hanging edge of mat with min cuing for scapular retraction   Prone Y 3x 8 2#DB w/ UE hanging  edge of mat with min cuing for scapular retraction     Seated overhead scaption BluTB 3x 10; towel roll behind up back to promoted thoracic ext; min cuing for scapular retraction                         PT Education - 03/09/20 1324    Education Details therex form/technique    Person(s) Educated Patient    Methods Explanation;Demonstration;Verbal cues    Comprehension Verbalized understanding;Returned demonstration;Verbal cues required            PT Short Term Goals - 02/21/20 0907      PT SHORT TERM GOAL #1   Title Pt will be independent with HEP to increase periscapular strength and improve posture to decrease pain with household ADLs    Baseline 02/21/20 HEP given    Time 4    Period Weeks    Status New             PT Long Term Goals - 02/21/20 0910      PT LONG TERM GOAL #1   Title Pt will decrease worst pain level by at least 3 points on NPRS to demonstrate clinically significant reduction in thoracic back pain.    Baseline 02/21/20 6/10    Time 8    Period Weeks    Status New      PT LONG TERM GOAL #2   Title Pt will demonstrate periscapular MMT of 4+ to demonstrate increase in strength for household ADLs and community activites.    Baseline 02/21/20 L/R T - 4; Y - 4-    Time 8    Period Weeks    Status New      PT LONG TERM GOAL #3   Title Pt will increase FOTO to 71 to demonstrate predicted increase in functional mobility to perform household ADLs and community activity.    Baseline 02/21/20 67    Time 8    Period Days    Status New                 Plan - 03/09/20 0905    Clinical Impression Statement PT continued therex progression for increased postural strength. Pt continues to perform all therex with proper technique following demonstration and min cuing. Pt continues to have no increases in pain during therex and verbalizes still only has pain from bra straps and prolonged standing/standing with relief from supporting breasts.  PT recently reached out to MD for breast reduction as viable option for pain reduction. PT will continue progression as able.    Personal Factors and Comorbidities Comorbidity 2;Profession;Fitness;Age  Examination-Activity Limitations Stand;Sit    Examination-Participation Restrictions Community Activity;Occupation;Cleaning;Laundry    Stability/Clinical Decision Making Evolving/Moderate complexity    Clinical Decision Making Moderate    Rehab Potential Good    PT Frequency 2x / week    PT Duration 8 weeks    PT Treatment/Interventions ADLs/Self Care Home Management;Electrical Stimulation;Moist Heat;Cryotherapy;Iontophoresis 4mg /ml Dexamethasone;Functional mobility training;Neuromuscular re-education;Therapeutic exercise;Therapeutic activities;Patient/family education;Manual techniques;Dry needling;Passive range of motion;Energy conservation;Joint Manipulations;Spinal Manipulations    PT Next Visit Plan HEP review, Plan of care    PT Home Exercise Plan Prone press ups; GTB overhead Y & pull aparts    Consulted and Agree with Plan of Care Patient           Patient will benefit from skilled therapeutic intervention in order to improve the following deficits and impairments:  Decreased strength,Hypomobility,Decreased mobility,Decreased range of motion,Impaired flexibility,Postural dysfunction,Improper body mechanics,Pain,Decreased activity tolerance,Decreased endurance  Visit Diagnosis: Chronic bilateral thoracic back pain     Problem List Patient Active Problem List   Diagnosis Date Noted  . Breast hypertrophy in female 11/21/2019  . Muscle cramps 06/10/2019  . Colicky RUQ abdominal pain 02/04/2019  . Night sweats 11/15/2018  . Long-term use of high-risk medication 08/13/2018  . Back pain 10/05/2017  . Chronic constipation 12/17/2016  . Endometriosis 12/17/2016  . Recurrent headache 01/06/2016  . IBS (irritable bowel syndrome) 07/17/2014  . Status post [facility] 07/15/2014  . Cyst  (solitary) of breast 07/12/2013  . Essential hypertension 01/18/2013  . Neck pain 08/14/2012  . Visit for preventive health examination 06/18/2012  . Acquired cyst of kidney 02/18/2012  . H/O urinary stone 02/18/2012  . Depression with anxiety 01/07/2012  . Obsessive compulsive disorder     Durwin Reges DPT Turner Daniels, SPT  Durwin Reges 03/09/2020, 1:27 PM  [facility] [address] [place], [address] Phone: [phone]   Fax:  [phone]  Name: Taylin Mans MRN: [id number] Date of Birth: 01-Nov-1970

## 2020-03-13 ENCOUNTER — Encounter: Payer: Self-pay | Admitting: Dermatology

## 2020-03-13 NOTE — Telephone Encounter (Signed)
-----   Message from Crecencio Mc, MD sent at 03/07/2020  4:42 PM EST ----- Regarding: RE: Pt [place],  Insurance will most likely need another OV with me so I can  document  her lack of improvement in shoulder pain despite her weeks of PT.  I will have Lynnelle Mesmer schedule her a follow up.   Thanks  TT ----- Message ----- From: Kelton Pillar, PT Sent: 03/07/2020   2:57 PM EST To: Crecencio Mc, MD Subject: Pt Advisement                                  Copied from epic message Dr. Derrel Nip- I have been seeing this patient for a few visits. She has made good postural correction, and ergonomic gains, but she and I still feel like she is a good candidate for breast reduction as the only pain she has is from bra straps and is relieved with offloading breasts.  She had had a consult with Dr. Marla Roe, but insurance told her to pursue PT first, so I just didn't know the right route from here

## 2020-03-14 ENCOUNTER — Encounter: Payer: No Typology Code available for payment source | Admitting: Physical Therapy

## 2020-03-16 ENCOUNTER — Ambulatory Visit: Payer: No Typology Code available for payment source | Admitting: Physical Therapy

## 2020-03-20 ENCOUNTER — Ambulatory Visit: Payer: No Typology Code available for payment source | Admitting: Physical Therapy

## 2020-03-20 ENCOUNTER — Encounter: Payer: No Typology Code available for payment source | Admitting: Physical Therapy

## 2020-03-21 ENCOUNTER — Other Ambulatory Visit: Payer: Self-pay | Admitting: Internal Medicine

## 2020-03-22 ENCOUNTER — Encounter: Payer: No Typology Code available for payment source | Admitting: Physical Therapy

## 2020-03-27 ENCOUNTER — Encounter: Payer: No Typology Code available for payment source | Admitting: Physical Therapy

## 2020-03-29 ENCOUNTER — Encounter: Payer: No Typology Code available for payment source | Admitting: Physical Therapy

## 2020-04-03 ENCOUNTER — Ambulatory Visit: Payer: No Typology Code available for payment source | Admitting: Internal Medicine

## 2020-04-03 ENCOUNTER — Encounter: Payer: No Typology Code available for payment source | Admitting: Physical Therapy

## 2020-04-04 ENCOUNTER — Ambulatory Visit: Payer: No Typology Code available for payment source | Admitting: Cardiology

## 2020-04-05 ENCOUNTER — Encounter: Payer: No Typology Code available for payment source | Admitting: Physical Therapy

## 2020-04-10 ENCOUNTER — Encounter: Payer: No Typology Code available for payment source | Admitting: Physical Therapy

## 2020-04-12 ENCOUNTER — Encounter: Payer: No Typology Code available for payment source | Admitting: Physical Therapy

## 2020-04-19 ENCOUNTER — Other Ambulatory Visit: Payer: Self-pay

## 2020-04-19 MED FILL — Estradiol Tab 1 MG: ORAL | 30 days supply | Qty: 30 | Fill #0 | Status: AC

## 2020-04-19 MED FILL — Amlodipine Besylate Tab 2.5 MG (Base Equivalent): ORAL | 30 days supply | Qty: 30 | Fill #0 | Status: AC

## 2020-04-19 MED FILL — Citalopram Hydrobromide Tab 10 MG (Base Equiv): ORAL | 90 days supply | Qty: 90 | Fill #0 | Status: AC

## 2020-04-20 ENCOUNTER — Other Ambulatory Visit: Payer: Self-pay

## 2020-04-20 MED ORDER — ALPRAZOLAM 0.25 MG PO TABS
ORAL_TABLET | ORAL | 2 refills | Status: DC
  Filled 2020-04-20: qty 8, 30d supply, fill #0

## 2020-04-20 MED ORDER — AMPHETAMINE-DEXTROAMPHET ER 5 MG PO CP24
ORAL_CAPSULE | ORAL | 0 refills | Status: DC
  Filled 2020-04-20: qty 30, 30d supply, fill #0

## 2020-04-20 NOTE — H&P (View-Only) (Signed)
Patient ID: Connie Francis, female    DOB: [birth year], 50 y.o.   MRN: [id number]  Chief Complaint  Patient presents with  . Pre-op Exam      ICD-10-CM   1. Breast hypertrophy in female  N62   2. Chronic bilateral thoracic back pain  M54.6    G89.29   3. Neck pain  M54.2      History of Present Illness: Connie Francis is a 50 y.o.  female  with a history of macromastia.  She presents for preoperative evaluation for upcoming procedure, Bilateral Breast Reduction with possible liposuction, scheduled for 05/15/2020 with Dr.  Marla Roe  No history of DVT/PE.  Reports her uncle and grandfather have had blood clots.  No family or personal history of bleeding or clotting disorders.  Patient is not currently taking any blood thinners.  No history of CVA/MI.  She is currently on estradiol.  Summary of Previous Visit: STN on the right is 28 cm and left is 30 cm.  The IMF distance is 16 cm.  Preop bra size is 34 triple D cup.  She would like to much smaller.  Estimated excess breast tissue to remove the time of surgery is 350 g on the left and 350 g on the right.  She has a negative mammogram from 2021.  Her maternal aunt had breast cancer.  No tobacco use.  Job: Careers information officer  PMH Significant for: CHF after pregnancy with her first child, this has resolved, GERD, hypertension, migraines, thyroid disease   Past Medical History: Allergies: Allergies  Allergen Reactions  . Latex     Swelling and itching   . Lisinopril Cough    cough  . Penicillins     swelling  . Promethazine   . Shellfish Allergy     hives  . Sulfa Antibiotics     Rash and itching   . Lamictal [Lamotrigine] Rash    Blisters     Current Medications:  Current Outpatient Medications:  .  ALPRAZolam (XANAX) 0.25 MG tablet, Take 1 tablet by mouth once daily as needed, Disp: 8 tablet, Rfl: 2 .  amLODipine (NORVASC) 2.5 MG tablet, TAKE 1 TABLET (2.5 MG TOTAL) BY MOUTH DAILY., Disp: 30 tablet, Rfl:  5 .  amphetamine-dextroamphetamine (ADDERALL XR) 5 MG 24 hr capsule, Take 1 capsule by mouth daily with breakfast, Disp: 30 capsule, Rfl: 0 .  ciprofloxacin (CIPRO) 500 MG tablet, Take 1 tablet (500 mg total) by mouth 2 (two) times daily., Disp: 6 tablet, Rfl: 0 .  citalopram (CELEXA) 10 MG tablet, TAKE 1 TABLET BY MOUTH DAILY WITH BREAKFAST, Disp: 90 tablet, Rfl: 1 .  estradiol (ESTRACE) 1 MG tablet, TAKE 1 TABLET BY MOUTH DAILY., Disp: 30 tablet, Rfl: 2 .  HYDROcodone-acetaminophen (NORCO) 5-325 MG tablet, Take 1 tablet by mouth every 6 (six) hours as needed for up to 5 days for severe pain., Disp: 20 tablet, Rfl: 0 .  ondansetron (ZOFRAN) 4 MG tablet, Take 1 tablet (4 mg total) by mouth every 8 (eight) hours as needed for nausea or vomiting., Disp: 20 tablet, Rfl: 0 .  ALPRAZolam (XANAX) 0.25 MG tablet, Take 0.25 mg by mouth 2 (two) times daily as needed for anxiety., Disp: , Rfl:  .  ALPRAZolam (XANAX) 0.25 MG tablet, TAKE 1 TABLET BY MOUTH ONCE DAILY AS NEEDED, Disp: 8 tablet, Rfl: 2 .  ALPRAZolam (XANAX) 0.25 MG tablet, TAKE 1 TABLET BY MOUTH ONCE DAILY AS NEEDED, Disp: 8 tablet, Rfl:  2 .  citalopram (CELEXA) 10 MG tablet, Take 10 mg by mouth daily. , Disp: , Rfl:  .  citalopram (CELEXA) 10 MG tablet, TAKE 1 TABLET BY MOUTH ONCE DAILY WITH BREAKFAST, Disp: 90 tablet, Rfl: 0 .  COVID-19 mRNA vaccine, Pfizer, 30 MCG/0.3ML injection, USE AS DIRECTED, Disp: .3 mL, Rfl: 0 .  hyoscyamine (ANASPAZ) 0.125 MG TBDP disintergrating tablet, Place 1 tablet (0.125 mg total) under the tongue every 6 (six) hours as needed. For abdominal /esophageal pain (Patient not taking: Reported on 04/21/2020), Disp: 30 tablet, Rfl: 0 .  polyethylene glycol (MIRALAX / GLYCOLAX) packet, Take by mouth. (Patient not taking: Reported on 04/21/2020), Disp: , Rfl:   Past Medical Problems: Past Medical History:  Diagnosis Date  . Abdominal bloating   . Anxiety   . 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 CHF (congestive  heart failure) ([place]) 2006  . Chronic epigastric pain   . Constipation   . GERD (gastroesophageal reflux disease)   . Hiatal hernia 2017  . Hypertension   . IBS (irritable bowel syndrome)   . Kidney stones   . Lump or mass in breast    R-Breast  . Microscopic hematuria 10/05/2017  . Migraine   . Obsessive compulsive disorder   . 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 Psychogenic dyspepsia 11/27/2012  . Thyroid disease    hyper    Past Surgical History: Past Surgical History:  Procedure Laterality Date  . BREAST CYST ASPIRATION Left    negative 2010  . CESAREAN SECTION    . COLONOSCOPY WITH PROPOFOL N/A 07/27/2014   Procedure: COLONOSCOPY WITH PROPOFOL;  Surgeon: Lucilla Lame, MD;  Location: [facility];  Service: Endoscopy;  Laterality: N/A;  . colonscopy  07/27/2015  . DILATION AND CURETTAGE OF UTERUS  7494   complicated by heart failure  . ESOPHAGOGASTRODUODENOSCOPY (EGD) WITH PROPOFOL N/A 07/27/2014   Procedure: ESOPHAGOGASTRODUODENOSCOPY (EGD) WITH PROPOFOL;  Surgeon: Lucilla Lame, MD;  Location: [facility];  Service: Endoscopy;  Laterality: N/A;  . TUBAL LIGATION    . UPPER ENDOSCOPY W/ ESOPHAGEAL MANOMETRY    . VAGINAL HYSTERECTOMY      Social History: Social History   Socioeconomic History  . Marital status: Married    Spouse name: Not on file  . Number of children: Not on file  . Years of education: Not on file  . Highest education level: Not on file  Occupational History  . Not on file  Tobacco Use  . Smoking status: Never Smoker  . Smokeless tobacco: Never Used  Vaping Use  . Vaping Use: Never used  Substance and Sexual Activity  . Alcohol use: Yes    Alcohol/week: 1.0 standard drink    Types: 1 Glasses of wine per week    Comment: Rare  . Drug use: No  . Sexual activity: Yes    Birth control/protection: Surgical  Other Topics Concern  . Not on file  Social History Narrative  . Not on file   Social Determinants of Health    Financial Resource Strain: Not on file  Food Insecurity: Not on file  Transportation Needs: Not on file  Physical Activity: Not on file  Stress: Not on file  Social Connections: Not on file  Intimate Partner Violence: Not on file    Family History: Family History  Problem Relation Age of Onset  . Cancer Maternal Aunt        parotid Ca  . Hearing loss Maternal Grandmother   . Alcohol abuse  Maternal Grandmother   . Hypertension Mother   . Stroke Paternal Aunt   . Cancer Paternal Uncle        lung, nasopharnygeal  . Arthritis Maternal Grandfather   . Esophageal cancer Cousin 27  . Colon cancer Neg Hx   . Liver disease Neg Hx   . Breast cancer Neg Hx   . Ovarian cancer Neg Hx   . Diabetes Neg Hx     Review of Systems: Review of Systems  Constitutional: Negative.   Respiratory: Negative.   Cardiovascular: Negative.   Gastrointestinal: Negative.   Neurological: Negative.     Physical Exam: Vital Signs BP 134/87 (BP Location: Left Arm, Patient Position: Sitting, Cuff Size: Normal)   Pulse 68   Ht 5\' 2"  (1.575 m)   Wt 138 lb (62.6 kg)   SpO2 100%   BMI 25.24 kg/m   Physical Exam Constitutional:      General: Not in acute distress.    Appearance: Normal appearance. Not ill-appearing.  HENT:     Head: Normocephalic and atraumatic.  Eyes:     Pupils: Pupils are equal, round Neck:     Musculoskeletal: Normal range of motion.  Cardiovascular:     Rate and Rhythm: Normal rate    Pulses: Normal pulses.  Pulmonary:     Effort: Pulmonary effort is normal. No respiratory distress.  Abdominal:     General: Abdomen is flat. There is no distension.  Musculoskeletal: Normal range of motion.  Skin:    General: Skin is warm and dry.     Findings: No erythema or rash.  Neurological:     General: No focal deficit present.     Mental Status: Alert and oriented to person, place, and time. Mental status is at baseline.     Motor: No weakness.  Psychiatric:        Mood  and Affect: Mood normal.        Behavior: Behavior normal.    Assessment/Plan: The patient is scheduled for bilateral breast reduction with Dr. Marla Roe.  Risks, benefits, and alternatives of procedure discussed, questions answered and consent obtained.    Smoking Status: Non-smoker; Counseling Given?  N/A Last Mammogram: 10/15/2019; Results: Negative  Caprini Score: 7, high; Risk Factors include: Age, family history of DVT, currently on estradiol, and length of planned surgery. Recommendation for mechanical and pharmacological prophylaxis for 7 to 10 days postop per guideline.  Given the patient's overall health history and her mobility will hold postoperative anticoagulation.  We discussed risks of DVT and signs and symptoms of DVT. Encourage early ambulation.   Pictures obtained:[email or web address]  on 02/08/2020  Post-op Rx sent to pharmacy: Norco, Zofran, Cipro  Patient was provided with the breast reduction and General Surgical Risk consent document and Pain Medication Agreement prior to their appointment.  They had adequate time to read through the risk consent documents and Pain Medication Agreement. We also discussed them in person together during this preop appointment. All of their questions were answered to their satisfaction.  Recommended calling if they have any further questions.  Risk consent form and Pain Medication Agreement to be scanned into patient's chart.  The risk that can be encountered with breast reduction were discussed and include the following but not limited to these:  Breast asymmetry, fluid accumulation, firmness of the breast, inability to breast feed, loss of nipple or areola, skin loss, decrease or no nipple sensation, fat necrosis of the breast tissue, bleeding, infection, healing delay.  There are risks of  anesthesia, changes to skin sensation and injury to nerves or blood vessels.  The muscle can be temporarily or permanently injured.  You may have an allergic reaction to  tape, suture, glue, blood products which can result in skin discoloration, swelling, pain, skin lesions, poor healing.  Any of these can lead to the need for revisonal surgery or stage procedures.  A reduction has potential to interfere with diagnostic procedures.  Nipple or breast piercing can increase risks of infection.  This procedure is best done when the breast is fully developed.  Changes in the breast will continue to occur over time.  Pregnancy can alter the outcomes of previous breast reduction surgery, weight gain and weigh loss can also effect the long term appearance.    Electronically signed by: Carola Rhine Labron Bloodgood, PA-C 04/21/2020 8:56 AM

## 2020-04-20 NOTE — Progress Notes (Signed)
Patient ID: Connie Francis, female    DOB: Dec 10, 1970, 50 y.o.   MRN: [id number]  Chief Complaint  Patient presents with  . Pre-op Exam      ICD-10-CM   1. Breast hypertrophy in female  N62   2. Chronic bilateral thoracic back pain  M54.6    G89.29   3. Neck pain  M54.2      History of Present Illness: Connie Francis is a 50 y.o.  female  with a history of macromastia.  She presents for preoperative evaluation for upcoming procedure, Bilateral Breast Reduction with possible liposuction, scheduled for 05/15/2020 with Dr.  Marla Roe  No history of DVT/PE.  Reports her uncle and grandfather have had blood clots.  No family or personal history of bleeding or clotting disorders.  Patient is not currently taking any blood thinners.  No history of CVA/MI.  She is currently on estradiol.  Summary of Previous Visit: STN on the right is 28 cm and left is 30 cm.  The IMF distance is 16 cm.  Preop bra size is 34 triple D cup.  She would like to much smaller.  Estimated excess breast tissue to remove the time of surgery is 350 g on the left and 350 g on the right.  She has a negative mammogram from 2021.  Her maternal aunt had breast cancer.  No tobacco use.  Job: Careers information officer  PMH Significant for: CHF after pregnancy with her first child, this has resolved, GERD, hypertension, migraines, thyroid disease   Past Medical History: Allergies: Allergies  Allergen Reactions  . Latex     Swelling and itching   . Lisinopril Cough    cough  . Penicillins     swelling  . Promethazine   . Shellfish Allergy     hives  . Sulfa Antibiotics     Rash and itching   . Lamictal [Lamotrigine] Rash    Blisters     Current Medications:  Current Outpatient Medications:  .  ALPRAZolam (XANAX) 0.25 MG tablet, Take 1 tablet by mouth once daily as needed, Disp: 8 tablet, Rfl: 2 .  amLODipine (NORVASC) 2.5 MG tablet, TAKE 1 TABLET (2.5 MG TOTAL) BY MOUTH DAILY., Disp: 30 tablet, Rfl:  5 .  amphetamine-dextroamphetamine (ADDERALL XR) 5 MG 24 hr capsule, Take 1 capsule by mouth daily with breakfast, Disp: 30 capsule, Rfl: 0 .  ciprofloxacin (CIPRO) 500 MG tablet, Take 1 tablet (500 mg total) by mouth 2 (two) times daily., Disp: 6 tablet, Rfl: 0 .  citalopram (CELEXA) 10 MG tablet, TAKE 1 TABLET BY MOUTH DAILY WITH BREAKFAST, Disp: 90 tablet, Rfl: 1 .  estradiol (ESTRACE) 1 MG tablet, TAKE 1 TABLET BY MOUTH DAILY., Disp: 30 tablet, Rfl: 2 .  HYDROcodone-acetaminophen (NORCO) 5-325 MG tablet, Take 1 tablet by mouth every 6 (six) hours as needed for up to 5 days for severe pain., Disp: 20 tablet, Rfl: 0 .  ondansetron (ZOFRAN) 4 MG tablet, Take 1 tablet (4 mg total) by mouth every 8 (eight) hours as needed for nausea or vomiting., Disp: 20 tablet, Rfl: 0 .  ALPRAZolam (XANAX) 0.25 MG tablet, Take 0.25 mg by mouth 2 (two) times daily as needed for anxiety., Disp: , Rfl:  .  ALPRAZolam (XANAX) 0.25 MG tablet, TAKE 1 TABLET BY MOUTH ONCE DAILY AS NEEDED, Disp: 8 tablet, Rfl: 2 .  ALPRAZolam (XANAX) 0.25 MG tablet, TAKE 1 TABLET BY MOUTH ONCE DAILY AS NEEDED, Disp: 8 tablet, Rfl:  2 .  citalopram (CELEXA) 10 MG tablet, Take 10 mg by mouth daily. , Disp: , Rfl:  .  citalopram (CELEXA) 10 MG tablet, TAKE 1 TABLET BY MOUTH ONCE DAILY WITH BREAKFAST, Disp: 90 tablet, Rfl: 0 .  COVID-19 mRNA vaccine, Pfizer, 30 MCG/0.3ML injection, USE AS DIRECTED, Disp: .3 mL, Rfl: 0 .  hyoscyamine (ANASPAZ) 0.125 MG TBDP disintergrating tablet, Place 1 tablet (0.125 mg total) under the tongue every 6 (six) hours as needed. For abdominal /esophageal pain (Patient not taking: Reported on 04/21/2020), Disp: 30 tablet, Rfl: 0 .  polyethylene glycol (MIRALAX / GLYCOLAX) packet, Take by mouth. (Patient not taking: Reported on 04/21/2020), Disp: , Rfl:   Past Medical Problems: Past Medical History:  Diagnosis Date  . Abdominal bloating   . Anxiety   . 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 CHF (congestive  heart failure) ([place]) 2006  . Chronic epigastric pain   . Constipation   . GERD (gastroesophageal reflux disease)   . Hiatal hernia 2017  . Hypertension   . IBS (irritable bowel syndrome)   . Kidney stones   . Lump or mass in breast    R-Breast  . Microscopic hematuria 10/05/2017  . Migraine   . Obsessive compulsive disorder   . 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 Psychogenic dyspepsia 11/27/2012  . Thyroid disease    hyper    Past Surgical History: Past Surgical History:  Procedure Laterality Date  . BREAST CYST ASPIRATION Left    negative 2010  . CESAREAN SECTION    . COLONOSCOPY WITH PROPOFOL N/A 07/27/2014   Procedure: COLONOSCOPY WITH PROPOFOL;  Surgeon: Lucilla Lame, MD;  Location: [facility];  Service: Endoscopy;  Laterality: N/A;  . colonscopy  07/27/2015  . DILATION AND CURETTAGE OF UTERUS  6387   complicated by heart failure  . ESOPHAGOGASTRODUODENOSCOPY (EGD) WITH PROPOFOL N/A 07/27/2014   Procedure: ESOPHAGOGASTRODUODENOSCOPY (EGD) WITH PROPOFOL;  Surgeon: Lucilla Lame, MD;  Location: [facility];  Service: Endoscopy;  Laterality: N/A;  . TUBAL LIGATION    . UPPER ENDOSCOPY W/ ESOPHAGEAL MANOMETRY    . VAGINAL HYSTERECTOMY      Social History: Social History   Socioeconomic History  . Marital status: Married    Spouse name: Not on file  . Number of children: Not on file  . Years of education: Not on file  . Highest education level: Not on file  Occupational History  . Not on file  Tobacco Use  . Smoking status: Never Smoker  . Smokeless tobacco: Never Used  Vaping Use  . Vaping Use: Never used  Substance and Sexual Activity  . Alcohol use: Yes    Alcohol/week: 1.0 standard drink    Types: 1 Glasses of wine per week    Comment: Rare  . Drug use: No  . Sexual activity: Yes    Birth control/protection: Surgical  Other Topics Concern  . Not on file  Social History Narrative  . Not on file   Social Determinants of Health    Financial Resource Strain: Not on file  Food Insecurity: Not on file  Transportation Needs: Not on file  Physical Activity: Not on file  Stress: Not on file  Social Connections: Not on file  Intimate Partner Violence: Not on file    Family History: Family History  Problem Relation Age of Onset  . Cancer Maternal Aunt        parotid Ca  . Hearing loss Maternal Grandmother   . Alcohol abuse  Maternal Grandmother   . Hypertension Mother   . Stroke Paternal Aunt   . Cancer Paternal Uncle        lung, nasopharnygeal  . Arthritis Maternal Grandfather   . Esophageal cancer Cousin 80  . Colon cancer Neg Hx   . Liver disease Neg Hx   . Breast cancer Neg Hx   . Ovarian cancer Neg Hx   . Diabetes Neg Hx     Review of Systems: Review of Systems  Constitutional: Negative.   Respiratory: Negative.   Cardiovascular: Negative.   Gastrointestinal: Negative.   Neurological: Negative.     Physical Exam: Vital Signs BP 134/87 (BP Location: Left Arm, Patient Position: Sitting, Cuff Size: Normal)   Pulse 68   Ht 5\' 2"  (1.575 m)   Wt 138 lb (62.6 kg)   SpO2 100%   BMI 25.24 kg/m   Physical Exam Constitutional:      General: Not in acute distress.    Appearance: Normal appearance. Not ill-appearing.  HENT:     Head: Normocephalic and atraumatic.  Eyes:     Pupils: Pupils are equal, round Neck:     Musculoskeletal: Normal range of motion.  Cardiovascular:     Rate and Rhythm: Normal rate    Pulses: Normal pulses.  Pulmonary:     Effort: Pulmonary effort is normal. No respiratory distress.  Abdominal:     General: Abdomen is flat. There is no distension.  Musculoskeletal: Normal range of motion.  Skin:    General: Skin is warm and dry.     Findings: No erythema or rash.  Neurological:     General: No focal deficit present.     Mental Status: Alert and oriented to person, place, and time. Mental status is at baseline.     Motor: No weakness.  Psychiatric:        Mood  and Affect: Mood normal.        Behavior: Behavior normal.    Assessment/Plan: The patient is scheduled for bilateral breast reduction with Dr. Marla Roe.  Risks, benefits, and alternatives of procedure discussed, questions answered and consent obtained.    Smoking Status: Non-smoker; Counseling Given?  N/A Last Mammogram: 10/15/2019; Results: Negative  Caprini Score: 7, high; Risk Factors include: Age, family history of DVT, currently on estradiol, and length of planned surgery. Recommendation for mechanical and pharmacological prophylaxis for 7 to 10 days postop per guideline.  Given the patient's overall health history and her mobility will hold postoperative anticoagulation.  We discussed risks of DVT and signs and symptoms of DVT. Encourage early ambulation.   Pictures obtained:[email or web address]  on 02/08/2020  Post-op Rx sent to pharmacy: Norco, Zofran, Cipro  Patient was provided with the breast reduction and General Surgical Risk consent document and Pain Medication Agreement prior to their appointment.  They had adequate time to read through the risk consent documents and Pain Medication Agreement. We also discussed them in person together during this preop appointment. All of their questions were answered to their satisfaction.  Recommended calling if they have any further questions.  Risk consent form and Pain Medication Agreement to be scanned into patient's chart.  The risk that can be encountered with breast reduction were discussed and include the following but not limited to these:  Breast asymmetry, fluid accumulation, firmness of the breast, inability to breast feed, loss of nipple or areola, skin loss, decrease or no nipple sensation, fat necrosis of the breast tissue, bleeding, infection, healing delay.  There are risks of  anesthesia, changes to skin sensation and injury to nerves or blood vessels.  The muscle can be temporarily or permanently injured.  You may have an allergic reaction to  tape, suture, glue, blood products which can result in skin discoloration, swelling, pain, skin lesions, poor healing.  Any of these can lead to the need for revisonal surgery or stage procedures.  A reduction has potential to interfere with diagnostic procedures.  Nipple or breast piercing can increase risks of infection.  This procedure is best done when the breast is fully developed.  Changes in the breast will continue to occur over time.  Pregnancy can alter the outcomes of previous breast reduction surgery, weight gain and weigh loss can also effect the long term appearance.    Electronically signed by: Carola Rhine Ardel Jagger, PA-C 04/21/2020 8:56 AM

## 2020-04-21 ENCOUNTER — Encounter: Payer: Self-pay | Admitting: Surgical

## 2020-04-21 ENCOUNTER — Other Ambulatory Visit: Payer: Self-pay

## 2020-04-21 ENCOUNTER — Ambulatory Visit (INDEPENDENT_AMBULATORY_CARE_PROVIDER_SITE_OTHER): Payer: No Typology Code available for payment source | Admitting: Surgical

## 2020-04-21 VITALS — BP 134/87 | HR 68 | Ht 62.0 in | Wt 138.0 lb

## 2020-04-21 DIAGNOSIS — N62 Hypertrophy of breast: Secondary | ICD-10-CM

## 2020-04-21 DIAGNOSIS — M542 Cervicalgia: Secondary | ICD-10-CM

## 2020-04-21 DIAGNOSIS — G8929 Other chronic pain: Secondary | ICD-10-CM

## 2020-04-21 DIAGNOSIS — M546 Pain in thoracic spine: Secondary | ICD-10-CM

## 2020-04-21 MED ORDER — HYDROCODONE-ACETAMINOPHEN 5-325 MG PO TABS
1.0000 | ORAL_TABLET | Freq: Four times a day (QID) | ORAL | 0 refills | Status: AC | PRN
  Filled 2020-04-21: qty 20, 5d supply, fill #0

## 2020-04-21 MED ORDER — CIPROFLOXACIN HCL 500 MG PO TABS
500.0000 mg | ORAL_TABLET | Freq: Two times a day (BID) | ORAL | 0 refills | Status: DC
  Filled 2020-04-21: qty 6, 3d supply, fill #0

## 2020-04-21 MED ORDER — ONDANSETRON HCL 4 MG PO TABS
4.0000 mg | ORAL_TABLET | Freq: Three times a day (TID) | ORAL | 0 refills | Status: AC | PRN
  Filled 2020-04-21: qty 20, 7d supply, fill #0

## 2020-04-24 ENCOUNTER — Telehealth: Payer: Self-pay | Admitting: Cardiovascular Disease

## 2020-04-24 NOTE — Telephone Encounter (Signed)
   [facility] Pre-operative Risk Assessment    Patient Name: Connie Francis  DOB: April 02, 1970  MRN: [id number]   [facility]ARE STAFF: - Please ensure there is not already an duplicate clearance open for this procedure. - Under Visit Info/Reason for Call, type in Other and utilize the format Clearance MM/DD/YY or Clearance TBD. Do not use dashes or single digits. - If request is for dental extraction, please clarify the # of teeth to be extracted.  Request for surgical clearance:  1. What type of surgery is being performed? Breast reduction  2. When is this surgery scheduled? 05/15/20  3. What type of clearance is required (medical clearance vs. Pharmacy clearance to hold med vs. Both)? medical  4. Are there any medications that need to be held prior to surgery and how long? Not listed  5. Practice name and name of physician performing surgery? [facility], Dr. Lyndee Leo Dillingham  6. What is the office phone number? [phone]   7.   What is the office fax number? [phone]  8.   Anesthesia type (None, local, MAC, general) ? general   Elissa Hefty 04/24/2020, 10:09 AM  _________________________________________________________________   (provider comments below)

## 2020-04-24 NOTE — Telephone Encounter (Signed)
Scheduled

## 2020-04-24 NOTE — Telephone Encounter (Signed)
Request for pre-op medical clearance for breast reduction, however she has not been seen since 2016. Patient needs pre-op visit prior to procedure. Will route to [facility] Triage to schedule. Thanks

## 2020-04-24 NOTE — Telephone Encounter (Signed)
Thank you, will remove from pre-op pool.

## 2020-05-09 ENCOUNTER — Encounter: Payer: Self-pay | Admitting: Cardiovascular Disease

## 2020-05-09 ENCOUNTER — Other Ambulatory Visit: Payer: Self-pay

## 2020-05-09 ENCOUNTER — Ambulatory Visit: Payer: No Typology Code available for payment source | Admitting: Cardiovascular Disease

## 2020-05-09 VITALS — BP 126/80 | HR 64 | Ht 62.0 in | Wt 139.5 lb

## 2020-05-09 DIAGNOSIS — Z01810 Encounter for preprocedural cardiovascular examination: Secondary | ICD-10-CM

## 2020-05-09 NOTE — Patient Instructions (Signed)
Medication Instructions:  Your physician recommends that you continue on your current medications as directed. Please refer to the Current Medication list given to you today.  *If you need a refill on your cardiac medications before your next appointment, please call your pharmacy*   Lab Work: None ordered If you have labs (blood work) drawn today and your tests are completely normal, you will receive your results only by: Marland Kitchen MyChart Message (if you have MyChart) OR . A paper copy in the mail If you have any lab test that is abnormal or we need to change your treatment, we will call you to review the results.   Testing/Procedures: None ordered   Follow-Up: At [facility], you and your health needs are our priority.  As part of our continuing mission to provide you with exceptional heart care, we have created designated Provider Care Teams.  These Care Teams include your primary Cardiologist (physician) and Advanced Practice Providers (APPs -  Physician Assistants and Nurse Practitioners) who all work together to provide you with the care you need, when you need it.  We recommend signing up for the patient portal called "MyChart".  Sign up information is provided on this After Visit Summary.  MyChart is used to connect with patients for Virtual Visits (Telemedicine).  Patients are able to view lab/test results, encounter notes, upcoming appointments, etc.  Non-urgent messages can be sent to your provider as well.   To learn more about what you can do with MyChart, go to [email or web address].    Your next appointment:   As needed  The format for your next appointment:   In Person  Provider:   You may see  Kathlyn Sacramento, MD or one of the following Advanced Practice Providers on your designated Care Team:    Murray Hodgkins, NP  Christell Faith, PA-C  Marrianne Mood, PA-C  Cadence Timberlane, [place]  Laurann Montana, NP    Other Instructions You are considered low risk from a  cardiac standpoint for your upcoming surgery.

## 2020-05-09 NOTE — Progress Notes (Signed)
Cardiology Office Note   Date:  05/10/2020   ID:  Connie Francis, Connie Francis 05/14/1970, MRN [id number]  PCP:  Crecencio Mc, MD  Cardiologist:   Kathlyn Sacramento, MD   Chief Complaint  Patient presents with  . Medical Clearance    Cardiac clearance for Breast Reduction no complaints today. Meds reviewed verbally with pt.      History of Present Illness: Connie Francis is a 50 y.o. female who is here for preoperative cardiovascular evaluation before breast reduction surgery. She was seen by me many years ago for palpitations due to mild PVCs and PACs. She had an echocardiogram in August of 2013 which showed normal LV systolic function with no significant valvular abnormalities.  She is concerned because during remote surgeries in the past she developed some volume overload.  At the present time, she denies any chest pain, shortness of breath or palpitations.  No leg edema except when she walks for a long day.  She is able to do her activities of daily living with no significant limitations.  Past Medical History:  Diagnosis Date  . Abdominal bloating   . Anxiety   . 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 CHF (congestive heart failure) ([place]) 2006  . Chronic epigastric pain   . Constipation   . GERD (gastroesophageal reflux disease)   . Hiatal hernia 2017  . Hypertension   . IBS (irritable bowel syndrome)   . Kidney stones   . Lump or mass in breast    R-Breast  . Microscopic hematuria 10/05/2017  . Migraine   . Obsessive compulsive disorder   . 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 Psychogenic dyspepsia 11/27/2012  . Thyroid disease    hyper    Past Surgical History:  Procedure Laterality Date  . BREAST CYST ASPIRATION Left    negative 2010  . CESAREAN SECTION    . COLONOSCOPY WITH PROPOFOL N/A 07/27/2014   Procedure: COLONOSCOPY WITH PROPOFOL;  Surgeon: Lucilla Lame, MD;  Location: [place];  Service: Endoscopy;  Laterality: N/A;  .  colonscopy  07/27/2015  . DILATION AND CURETTAGE OF UTERUS  3825   complicated by heart failure  . ESOPHAGOGASTRODUODENOSCOPY (EGD) WITH PROPOFOL N/A 07/27/2014   Procedure: ESOPHAGOGASTRODUODENOSCOPY (EGD) WITH PROPOFOL;  Surgeon: Lucilla Lame, MD;  Location: [place];  Service: Endoscopy;  Laterality: N/A;  . TUBAL LIGATION    . UPPER ENDOSCOPY W/ ESOPHAGEAL MANOMETRY    . VAGINAL HYSTERECTOMY       Current Outpatient Medications  Medication Sig Dispense Refill  . ALPRAZolam (XANAX) 0.25 MG tablet TAKE 1 TABLET BY MOUTH ONCE DAILY AS NEEDED 8 tablet 2  . amLODipine (NORVASC) 2.5 MG tablet TAKE 1 TABLET (2.5 MG TOTAL) BY MOUTH DAILY. 30 tablet 5  . amphetamine-dextroamphetamine (ADDERALL XR) 5 MG 24 hr capsule Take 1 capsule by mouth daily with breakfast 30 capsule 0  . citalopram (CELEXA) 10 MG tablet Take 10 mg by mouth at bedtime.    Marland Kitchen COVID-19 mRNA vaccine, Pfizer, 30 MCG/0.3ML injection USE AS DIRECTED .3 mL 0  . estradiol (ESTRACE) 1 MG tablet TAKE 1 TABLET BY MOUTH DAILY. 30 tablet 2  . hyoscyamine (ANASPAZ) 0.125 MG TBDP disintergrating tablet Place 1 tablet (0.125 mg total) under the tongue every 6 (six) hours as needed. For abdominal /esophageal pain 30 tablet 0  . ondansetron (ZOFRAN) 4 MG tablet Take 1 tablet (4 mg total) by mouth every 8 (eight) hours as needed for nausea or vomiting.  20 tablet 0  . polyethylene glycol (MIRALAX / GLYCOLAX) packet Take 17 g by mouth daily as needed for mild constipation.    . Probiotic Product (PROBIOTIC DAILY PO) Take 2 capsules by mouth daily with supper. Gummie     No current facility-administered medications for this visit.    Allergies:   Latex, Lisinopril, Penicillins, Promethazine, Shellfish allergy, Sulfa antibiotics, and Lamictal [lamotrigine]    Social History:  The patient  reports that she has never smoked. She has never used smokeless tobacco. She reports previous alcohol use of about 1.0 standard drink of alcohol per  week. She reports that she does not use drugs.   Family History:  The patient's family history includes Alcohol abuse in her maternal grandmother; Arthritis in her maternal grandfather; Cancer in her maternal aunt and paternal uncle; Esophageal cancer (age of onset: 42) in her cousin; Hearing loss in her maternal grandmother; Hypertension in her mother; Stroke in her paternal aunt.    ROS:  Please see the history of present illness.   Otherwise, review of systems are positive for none.   All other systems are reviewed and negative.    PHYSICAL EXAM: VS:  BP 126/80 (BP Location: Right Arm, Patient Position: Sitting, Cuff Size: Normal)   Pulse 64   Ht 5\' 2"  (1.575 m)   Wt 139 lb 8 oz (63.3 kg)   SpO2 98%   BMI 25.51 kg/m  , BMI Body mass index is 25.51 kg/m. GEN: Well nourished, well developed, in no acute distress  HEENT: normal  Neck: no JVD, carotid bruits, or masses Cardiac: RRR; no murmurs, rubs, or gallops,no edema  Respiratory:  clear to auscultation bilaterally, normal work of breathing GI: soft, nontender, nondistended, + BS MS: no deformity or atrophy  Skin: warm and dry, no rash Neuro:  Strength and sensation are intact Psych: euthymic mood, full affect   EKG:  EKG is ordered today. The ekg ordered today demonstrates normal sinus rhythm with low voltage and no significant ST or T wave changes.  No evidence of prior infarct.   Recent Labs: 06/10/2019: Magnesium 2.2 11/24/2019: ALT 10; BUN 14; Creatinine, Ser 0.72; Hemoglobin 13.0; Platelets 229.0; Potassium 3.9; Sodium 138; TSH 0.57    Lipid Panel    Component Value Date/Time   CHOL 200 11/24/2019 0822   TRIG 114.0 11/24/2019 0822   HDL 70.20 11/24/2019 0822   CHOLHDL 3 11/24/2019 0822   VLDL 22.8 11/24/2019 0822   LDLCALC 107 (H) 11/24/2019 0822      Wt Readings from Last 3 Encounters:  05/09/20 139 lb 8 oz (63.3 kg)  04/21/20 138 lb (62.6 kg)  02/08/20 137 lb (62.1 kg)       PAD Screen 05/09/2020   Previous PAD dx? No  Previous surgical procedure? No  Pain with walking? No  Feet/toe relief with dangling? No  Painful, non-healing ulcers? No  Extremities discolored? No      ASSESSMENT AND PLAN:  1.  Preoperative cardiovascular evaluation for breast reduction surgery: The patient has no symptoms of angina, heart failure or arrhythmia.  Her functional capacity is greater than 4 METS and baseline EKG is normal. She is at low risk from a cardiac standpoint with no need for further cardiac testing.  2.  Essential hypertension: Blood pressures controlled with amlodipine.  3.  Previous palpitations: No evidence of recurrent arrhythmia.    Disposition:   FU with me as needed.  Signed,  Kathlyn Sacramento, MD  05/10/2020 5:20 PM  [facility]

## 2020-05-11 ENCOUNTER — Other Ambulatory Visit
Admission: RE | Admit: 2020-05-11 | Discharge: 2020-05-11 | Disposition: A | Discharge status: Home / Self Care | Payer: No Typology Code available for payment source | Source: Ambulatory Visit | Attending: Plastic Surgery | Admitting: Plastic Surgery

## 2020-05-11 ENCOUNTER — Other Ambulatory Visit: Payer: Self-pay

## 2020-05-11 ENCOUNTER — Encounter (HOSPITAL_COMMUNITY): Payer: Self-pay | Admitting: Plastic Surgery

## 2020-05-11 DIAGNOSIS — Z01812 Encounter for preprocedural laboratory examination: Secondary | ICD-10-CM | POA: Diagnosis present

## 2020-05-11 DIAGNOSIS — Z20822 Contact with and (suspected) exposure to covid-19: Secondary | ICD-10-CM | POA: Diagnosis not present

## 2020-05-11 LAB — SARS CORONAVIRUS 2 (TAT 6-24 HRS): SARS Coronavirus 2: NEGATIVE

## 2020-05-11 NOTE — Progress Notes (Signed)
PCP - Dr. Derrel Nip  Cardiologist - Dr. Fletcher Anon - for cardiac clearance for this surgery   Chest x-ray -  EKG - 05/09/20 Stress Test -  ECHO - 2013 Cardiac Cath -    COVID TEST- 05/11/20 pending results   Anesthesia review: yes   -------------  SDW INSTRUCTIONS:  Your procedure is scheduled on 05/15/20. Please report to [facility] Main Entrance "A" at 0530 A.M., and check in at the Admitting office. Call this number if you have problems the morning of surgery: [phone]   Remember: Do not eat or drink after midnight the night before your surgery  Medications to take morning of surgery with a sip of water include: none (pt takes all meds at night)   As of today, STOP taking any Aspirin (unless otherwise instructed by your surgeon), Aleve, Naproxen, Ibuprofen, Motrin, Advil, Goody's, BC's, all herbal medications, fish oil, and all vitamins.  The Morning of Surgery Do not wear jewelry, make-up or nail polish. Do not wear lotions, powders, or perfumes, or deodorant Do not shave 48 hours prior to surgery.   Men may shave face and neck. Do not bring valuables to the hospital. [facility] [place] is not responsible for any belongings or valuables. If you are a smoker, DO NOT Smoke 24 hours prior to surgery If you wear a CPAP at night please bring your mask the morning of surgery  Remember that you must have someone to transport you home after your surgery, and remain with you for 24 hours if you are discharged the same day. Please bring cases for contacts, glasses, hearing aids, dentures or bridgework because it cannot be worn into surgery.   Patients discharged the day of surgery will not be allowed to drive home.   Please shower the NIGHT BEFORE SURGERY and the MORNING OF SURGERY with DIAL Soap. Wear comfortable clothes the morning of surgery. Oral Hygiene is also important to reduce your risk of infection.  Remember - BRUSH YOUR TEETH THE MORNING OF SURGERY WITH YOUR REGULAR  TOOTHPASTE  Patient denies shortness of breath, fever, cough and chest pain.

## 2020-05-12 ENCOUNTER — Other Ambulatory Visit: Payer: Self-pay

## 2020-05-12 ENCOUNTER — Other Ambulatory Visit (HOSPITAL_COMMUNITY): Payer: No Typology Code available for payment source

## 2020-05-12 MED ORDER — CITALOPRAM HYDROBROMIDE 10 MG PO TABS
ORAL_TABLET | ORAL | 0 refills | Status: DC
  Filled 2020-05-12 – 2020-07-17 (×2): qty 90, 90d supply, fill #0

## 2020-05-12 NOTE — Anesthesia Preprocedure Evaluation (Addendum)
Anesthesia Evaluation  Patient identified by MRN, date of birth, ID band Patient awake    Reviewed: Allergy & Precautions, NPO status , Patient's Chart, lab work & pertinent test results  Airway Mallampati: II  TM Distance: >3 FB Neck ROM: Full    Dental  (+) Teeth Intact, Dental Advisory Given   Pulmonary    breath sounds clear to auscultation       Cardiovascular hypertension,  Rhythm:Regular Rate:Normal     Neuro/Psych    GI/Hepatic   Endo/Other    Renal/GU      Musculoskeletal   Abdominal   Peds  Hematology   Anesthesia Other Findings   Reproductive/Obstetrics                            Anesthesia Physical Anesthesia Plan  ASA: II  Anesthesia Plan: General   Post-op Pain Management:    Induction: Intravenous  PONV Risk Score and Plan: Ondansetron and Dexamethasone  Airway Management Planned: Oral ETT  Additional Equipment:   Intra-op Plan:   Post-operative Plan:   Informed Consent: I have reviewed the patients History and Physical, chart, labs and discussed the procedure including the risks, benefits and alternatives for the proposed anesthesia with the patient or authorized representative who has indicated his/her understanding and acceptance.     Dental advisory given  Plan Discussed with: CRNA and Anesthesiologist  Anesthesia Plan Comments: (See APP note by Durel Salts, FNP )       Anesthesia Quick Evaluation

## 2020-05-12 NOTE — Progress Notes (Signed)
Anesthesia Chart Review:  Pt is a same day work up    Case: [id number] Date/Time: 05/15/20 0715   Procedure: BREAST REDUCTION WITH LIPOSUCTION (Bilateral Breast)   Anesthesia type: General   Pre-op diagnosis: mammary hypertrophy   Location: [facility] OR ROOM 06 / [place] OR   Surgeons: Wallace Going, DO      DISCUSSION: Pt is 50 years old with hx CHF/postpartum cardiomyopathy (recovered- cardiac function normal by 2015 echo),  hyperthyroidism   PROVIDERS: - PCP is Crecencio Mc, MD - Cardiologist is Kathlyn Sacramento, MD who cleared pt for surgery at last office visit 05/09/20. F/u prn recommended   LABS: Will be obtained day of surgery    EKG 05/09/20: NSR   CV: Echo 02/22/13 (care everywhere):  - Normal left ventricular ejection fraction (55-60%)  - Normal right ventricular contractile performance    Past Medical History:  Diagnosis Date  . Abdominal bloating   . Anxiety   . 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 CHF (congestive heart failure) ([place]) 2006  . Chronic epigastric pain   . Constipation   . GERD (gastroesophageal reflux disease)   . Hiatal hernia 2017  . Hypertension   . IBS (irritable bowel syndrome)   . Kidney stones   . Lump or mass in breast    R-Breast  . Microscopic hematuria 10/05/2017  . Migraine   . Obsessive compulsive disorder   . 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 Psychogenic dyspepsia 11/27/2012  . Thyroid disease    hyper    Past Surgical History:  Procedure Laterality Date  . BREAST CYST ASPIRATION Left    negative 2010  . CESAREAN SECTION    . COLONOSCOPY WITH PROPOFOL N/A 07/27/2014   Procedure: COLONOSCOPY WITH PROPOFOL;  Surgeon: Lucilla Lame, MD;  Location: [place];  Service: Endoscopy;  Laterality: N/A;  . colonscopy  07/27/2015  . DILATION AND CURETTAGE OF UTERUS  0737   complicated by heart failure  . ESOPHAGOGASTRODUODENOSCOPY (EGD) WITH PROPOFOL N/A 07/27/2014   Procedure: ESOPHAGOGASTRODUODENOSCOPY  (EGD) WITH PROPOFOL;  Surgeon: Lucilla Lame, MD;  Location: [place];  Service: Endoscopy;  Laterality: N/A;  . TUBAL LIGATION    . UPPER ENDOSCOPY W/ ESOPHAGEAL MANOMETRY    . VAGINAL HYSTERECTOMY      MEDICATIONS: No current facility-administered medications for this encounter.   Marland Kitchen amLODipine (NORVASC) 2.5 MG tablet  . amphetamine-dextroamphetamine (ADDERALL XR) 5 MG 24 hr capsule  . estradiol (ESTRACE) 1 MG tablet  . hyoscyamine (ANASPAZ) 0.125 MG TBDP disintergrating tablet  . ondansetron (ZOFRAN) 4 MG tablet  . polyethylene glycol (MIRALAX / GLYCOLAX) packet  . Probiotic Product (PROBIOTIC DAILY PO)  . ALPRAZolam (XANAX) 0.25 MG tablet  . citalopram (CELEXA) 10 MG tablet  . COVID-19 mRNA vaccine, Pfizer, 30 MCG/0.3ML injection    If labs acceptable day of surgery, I anticipate pt can proceed with surgery as scheduled.   Willeen Cass, PhD, FNP-BC [facility]/Anesthesiology Phone: [phone] 05/12/2020 12:09 PM

## 2020-05-15 ENCOUNTER — Ambulatory Visit (HOSPITAL_COMMUNITY)
Admission: RE | Admit: 2020-05-15 | Discharge: 2020-05-15 | Disposition: A | Discharge status: Home / Self Care | Payer: No Typology Code available for payment source | Attending: Plastic Surgery | Admitting: Plastic Surgery

## 2020-05-15 ENCOUNTER — Encounter (HOSPITAL_COMMUNITY): Payer: Self-pay | Admitting: Plastic Surgery

## 2020-05-15 ENCOUNTER — Other Ambulatory Visit: Payer: Self-pay

## 2020-05-15 ENCOUNTER — Ambulatory Visit (HOSPITAL_COMMUNITY): Payer: No Typology Code available for payment source | Admitting: Emergency Medicine

## 2020-05-15 ENCOUNTER — Encounter (HOSPITAL_COMMUNITY)
Admission: RE | Disposition: A | Discharge status: Home / Self Care | Payer: Self-pay | Source: Home / Self Care | Attending: Plastic Surgery

## 2020-05-15 DIAGNOSIS — N62 Hypertrophy of breast: Secondary | ICD-10-CM | POA: Insufficient documentation

## 2020-05-15 DIAGNOSIS — Z90710 Acquired absence of both cervix and uterus: Secondary | ICD-10-CM | POA: Insufficient documentation

## 2020-05-15 DIAGNOSIS — M549 Dorsalgia, unspecified: Secondary | ICD-10-CM | POA: Diagnosis not present

## 2020-05-15 DIAGNOSIS — Z79899 Other long term (current) drug therapy: Secondary | ICD-10-CM | POA: Insufficient documentation

## 2020-05-15 DIAGNOSIS — Z91040 Latex allergy status: Secondary | ICD-10-CM | POA: Diagnosis not present

## 2020-05-15 DIAGNOSIS — I10 Essential (primary) hypertension: Secondary | ICD-10-CM | POA: Diagnosis not present

## 2020-05-15 DIAGNOSIS — N6022 Fibroadenosis of left breast: Secondary | ICD-10-CM | POA: Insufficient documentation

## 2020-05-15 DIAGNOSIS — Z801 Family history of malignant neoplasm of trachea, bronchus and lung: Secondary | ICD-10-CM | POA: Insufficient documentation

## 2020-05-15 DIAGNOSIS — Z8679 Personal history of other diseases of the circulatory system: Secondary | ICD-10-CM | POA: Diagnosis not present

## 2020-05-15 DIAGNOSIS — Z8759 Personal history of other complications of pregnancy, childbirth and the puerperium: Secondary | ICD-10-CM | POA: Insufficient documentation

## 2020-05-15 DIAGNOSIS — D241 Benign neoplasm of right breast: Secondary | ICD-10-CM | POA: Insufficient documentation

## 2020-05-15 DIAGNOSIS — M542 Cervicalgia: Secondary | ICD-10-CM | POA: Diagnosis not present

## 2020-05-15 DIAGNOSIS — Z803 Family history of malignant neoplasm of breast: Secondary | ICD-10-CM | POA: Insufficient documentation

## 2020-05-15 DIAGNOSIS — G8929 Other chronic pain: Secondary | ICD-10-CM

## 2020-05-15 DIAGNOSIS — Z8249 Family history of ischemic heart disease and other diseases of the circulatory system: Secondary | ICD-10-CM | POA: Diagnosis not present

## 2020-05-15 DIAGNOSIS — Z808 Family history of malignant neoplasm of other organs or systems: Secondary | ICD-10-CM | POA: Diagnosis not present

## 2020-05-15 DIAGNOSIS — M546 Pain in thoracic spine: Secondary | ICD-10-CM

## 2020-05-15 DIAGNOSIS — Z888 Allergy status to other drugs, medicaments and biological substances status: Secondary | ICD-10-CM | POA: Insufficient documentation

## 2020-05-15 DIAGNOSIS — Z880 Allergy status to penicillin: Secondary | ICD-10-CM | POA: Diagnosis not present

## 2020-05-15 DIAGNOSIS — Z882 Allergy status to sulfonamides status: Secondary | ICD-10-CM | POA: Insufficient documentation

## 2020-05-15 HISTORY — PX: BREAST REDUCTION SURGERY: SHX8

## 2020-05-15 LAB — CBC
HCT: 37.3 % (ref 36.0–46.0)
Hemoglobin: 12.5 g/dL (ref 12.0–15.0)
MCH: 30.7 pg (ref 26.0–34.0)
MCHC: 33.5 g/dL (ref 30.0–36.0)
MCV: 91.6 fL (ref 80.0–100.0)
Platelets: 233 10*3/uL (ref 150–400)
RBC: 4.07 MIL/uL (ref 3.87–5.11)
RDW: 11.9 % (ref 11.5–15.5)
WBC: 5.6 10*3/uL (ref 4.0–10.5)
nRBC: 0 % (ref 0.0–0.2)

## 2020-05-15 LAB — BASIC METABOLIC PANEL
Anion gap: 6 (ref 5–15)
BUN: 8 mg/dL (ref 6–20)
CO2: 30 mmol/L (ref 22–32)
Calcium: 9.1 mg/dL (ref 8.9–10.3)
Chloride: 102 mmol/L (ref 98–111)
Creatinine, Ser: 0.71 mg/dL (ref 0.44–1.00)
GFR, Estimated: >60 mL/min (ref >60)
Glucose, Bld: 97 mg/dL (ref 70–99)
Potassium: 3.8 mmol/L (ref 3.5–5.1)
Sodium: 138 mmol/L (ref 135–145)

## 2020-05-15 SURGERY — BREAST REDUCTION WITH LIPOSUCTION
Anesthesia: General | Site: Breast | Laterality: Bilateral

## 2020-05-15 MED ORDER — ONDANSETRON HCL 4 MG/2ML IJ SOLN
INTRAMUSCULAR | Status: DC | PRN
  Administered 2020-05-15: 4 mg via INTRAVENOUS

## 2020-05-15 MED ORDER — ACETAMINOPHEN 325 MG PO TABS: 650.0000 mg | ORAL_TABLET | ORAL | Status: DC | PRN

## 2020-05-15 MED ORDER — LIDOCAINE-EPINEPHRINE 1 %-1:100000 IJ SOLN
INTRAMUSCULAR | Status: AC
  Filled 2020-05-15: qty 1

## 2020-05-15 MED ORDER — 0.9 % SODIUM CHLORIDE (POUR BTL) OPTIME
TOPICAL | Status: DC | PRN
  Administered 2020-05-15: 1000 mL

## 2020-05-15 MED ORDER — BUPIVACAINE HCL 0.25 % IJ SOLN
INTRAMUSCULAR | Status: DC | PRN
  Administered 2020-05-15: 20 mL

## 2020-05-15 MED ORDER — LIDOCAINE-EPINEPHRINE 1 %-1:100000 IJ SOLN
INTRAMUSCULAR | Status: DC | PRN
  Administered 2020-05-15: 20 mL

## 2020-05-15 MED ORDER — SODIUM BICARBONATE 4.2 % IV SOLN
INTRAVENOUS | Status: DC
  Filled 2020-05-15: qty 50

## 2020-05-15 MED ORDER — ACETAMINOPHEN 650 MG RE SUPP: 650.0000 mg | RECTAL | Status: DC | PRN

## 2020-05-15 MED ORDER — BUPIVACAINE HCL (PF) 0.25 % IJ SOLN
INTRAMUSCULAR | Status: AC
  Filled 2020-05-15: qty 30

## 2020-05-15 MED ORDER — LIDOCAINE 2% (20 MG/ML) 5 ML SYRINGE
INTRAMUSCULAR | Status: AC
  Filled 2020-05-15: qty 5

## 2020-05-15 MED ORDER — EPHEDRINE 5 MG/ML INJ
INTRAVENOUS | Status: AC
  Filled 2020-05-15: qty 10

## 2020-05-15 MED ORDER — ONDANSETRON HCL 4 MG/2ML IJ SOLN
INTRAMUSCULAR | Status: AC
  Filled 2020-05-15: qty 2

## 2020-05-15 MED ORDER — FENTANYL CITRATE (PF) 250 MCG/5ML IJ SOLN
INTRAMUSCULAR | Status: AC
  Filled 2020-05-15: qty 5

## 2020-05-15 MED ORDER — OXYCODONE HCL 5 MG/5ML PO SOLN: 5.0000 mg | Freq: Once | ORAL | Status: DC | PRN

## 2020-05-15 MED ORDER — LIDOCAINE 2% (20 MG/ML) 5 ML SYRINGE
INTRAMUSCULAR | Status: DC | PRN
  Administered 2020-05-15: 30 mg via INTRAVENOUS

## 2020-05-15 MED ORDER — FENTANYL CITRATE (PF) 100 MCG/2ML IJ SOLN: 25.0000 ug | INTRAMUSCULAR | Status: DC | PRN

## 2020-05-15 MED ORDER — PROPOFOL 10 MG/ML IV BOLUS
INTRAVENOUS | Status: AC
  Filled 2020-05-15: qty 20

## 2020-05-15 MED ORDER — PHENYLEPHRINE 40 MCG/ML (10ML) SYRINGE FOR IV PUSH (FOR BLOOD PRESSURE SUPPORT)
PREFILLED_SYRINGE | INTRAVENOUS | Status: DC | PRN
  Administered 2020-05-15 (×3): 80 ug via INTRAVENOUS
  Administered 2020-05-15: 40 ug via INTRAVENOUS
  Administered 2020-05-15: 80 ug via INTRAVENOUS

## 2020-05-15 MED ORDER — PHENYLEPHRINE HCL-NACL 10-0.9 MG/250ML-% IV SOLN
INTRAVENOUS | Status: AC
  Filled 2020-05-15: qty 250

## 2020-05-15 MED ORDER — ORAL CARE MOUTH RINSE: 15.0000 mL | Freq: Once | OROMUCOSAL | Status: AC

## 2020-05-15 MED ORDER — OXYCODONE HCL 5 MG PO TABS: 5.0000 mg | ORAL_TABLET | ORAL | Status: DC | PRN

## 2020-05-15 MED ORDER — PROPOFOL 10 MG/ML IV BOLUS
INTRAVENOUS | Status: DC | PRN
  Administered 2020-05-15: 150 mg via INTRAVENOUS

## 2020-05-15 MED ORDER — SODIUM CHLORIDE 0.9 % IV SOLN: 250.0000 mL | INTRAVENOUS | Status: DC | PRN

## 2020-05-15 MED ORDER — ROCURONIUM BROMIDE 10 MG/ML (PF) SYRINGE
PREFILLED_SYRINGE | INTRAVENOUS | Status: DC | PRN
  Administered 2020-05-15: 50 mg via INTRAVENOUS

## 2020-05-15 MED ORDER — OXYCODONE HCL 5 MG PO TABS
5.0000 mg | ORAL_TABLET | Freq: Once | ORAL | Status: DC | PRN
Start: 2020-05-15 — End: 2020-05-15

## 2020-05-15 MED ORDER — CEFAZOLIN SODIUM-DEXTROSE 2-4 GM/100ML-% IV SOLN
2.0000 g | INTRAVENOUS | Status: AC
  Administered 2020-05-15: 2 g via INTRAVENOUS
  Filled 2020-05-15: qty 100

## 2020-05-15 MED ORDER — SODIUM CHLORIDE 0.9% FLUSH: 3.0000 mL | Freq: Two times a day (BID) | INTRAVENOUS | Status: DC

## 2020-05-15 MED ORDER — FENTANYL CITRATE (PF) 100 MCG/2ML IJ SOLN
25.0000 ug | INTRAMUSCULAR | Status: DC | PRN
Start: 2020-05-15 — End: 2020-05-15

## 2020-05-15 MED ORDER — FENTANYL CITRATE (PF) 250 MCG/5ML IJ SOLN
INTRAMUSCULAR | Status: DC | PRN
  Administered 2020-05-15 (×2): 50 ug via INTRAVENOUS
  Administered 2020-05-15: 100 ug via INTRAVENOUS

## 2020-05-15 MED ORDER — EPHEDRINE SULFATE-NACL 50-0.9 MG/10ML-% IV SOSY
PREFILLED_SYRINGE | INTRAVENOUS | Status: DC | PRN
  Administered 2020-05-15 (×3): 10 mg via INTRAVENOUS
  Administered 2020-05-15: 5 mg via INTRAVENOUS
  Administered 2020-05-15: 10 mg via INTRAVENOUS
  Administered 2020-05-15: 5 mg via INTRAVENOUS

## 2020-05-15 MED ORDER — ROCURONIUM BROMIDE 10 MG/ML (PF) SYRINGE
PREFILLED_SYRINGE | INTRAVENOUS | Status: AC
  Filled 2020-05-15: qty 10

## 2020-05-15 MED ORDER — ONDANSETRON HCL 4 MG/2ML IJ SOLN: 4.0000 mg | Freq: Once | INTRAMUSCULAR | Status: DC | PRN

## 2020-05-15 MED ORDER — DEXAMETHASONE SODIUM PHOSPHATE 10 MG/ML IJ SOLN
INTRAMUSCULAR | Status: AC
  Filled 2020-05-15: qty 1

## 2020-05-15 MED ORDER — CHLORHEXIDINE GLUCONATE CLOTH 2 % EX PADS
6.0000 | MEDICATED_PAD | Freq: Once | CUTANEOUS | Status: AC
  Administered 2020-05-15: 6 via TOPICAL

## 2020-05-15 MED ORDER — MIDAZOLAM HCL 2 MG/2ML IJ SOLN
INTRAMUSCULAR | Status: DC | PRN
  Administered 2020-05-15: 2 mg via INTRAVENOUS

## 2020-05-15 MED ORDER — MIDAZOLAM HCL 2 MG/2ML IJ SOLN
INTRAMUSCULAR | Status: AC
  Filled 2020-05-15: qty 2

## 2020-05-15 MED ORDER — SUGAMMADEX SODIUM 200 MG/2ML IV SOLN
INTRAVENOUS | Status: DC | PRN
  Administered 2020-05-15: 200 mg via INTRAVENOUS
  Administered 2020-05-15: 100 mg via INTRAVENOUS

## 2020-05-15 MED ORDER — DEXAMETHASONE SODIUM PHOSPHATE 10 MG/ML IJ SOLN
INTRAMUSCULAR | Status: DC | PRN
  Administered 2020-05-15: 10 mg via INTRAVENOUS

## 2020-05-15 MED ORDER — SODIUM CHLORIDE 0.9% FLUSH: 3.0000 mL | INTRAVENOUS | Status: DC | PRN

## 2020-05-15 MED ORDER — LACTATED RINGERS IV SOLN: INTRAVENOUS | Status: DC

## 2020-05-15 MED ORDER — CHLORHEXIDINE GLUCONATE 0.12 % MT SOLN
15.0000 mL | Freq: Once | OROMUCOSAL | Status: AC
  Administered 2020-05-15: 15 mL via OROMUCOSAL
  Filled 2020-05-15: qty 15

## 2020-05-15 SURGICAL SUPPLY — 54 items
BINDER BREAST LRG (GAUZE/BANDAGES/DRESSINGS) IMPLANT
BINDER BREAST MEDIUM (GAUZE/BANDAGES/DRESSINGS) IMPLANT
BINDER BREAST XLRG (GAUZE/BANDAGES/DRESSINGS) IMPLANT
BINDER BREAST XXLRG (GAUZE/BANDAGES/DRESSINGS) IMPLANT
BLADE SURG 10 STRL SS (BLADE) ×2 IMPLANT
BLADE SURG 15 STRL LF DISP TIS (BLADE) IMPLANT
BLADE SURG 15 STRL SS (BLADE)
BNDG GAUZE ELAST 4 BULKY (GAUZE/BANDAGES/DRESSINGS) ×4 IMPLANT
CANISTER SUCT 1200ML W/VALVE (MISCELLANEOUS) ×2 IMPLANT
CHLORAPREP W/TINT 26 (MISCELLANEOUS) ×2 IMPLANT
COVER BACK TABLE 60X90IN (DRAPES) ×4 IMPLANT
COVER MAYO STAND STRL (DRAPES) ×2 IMPLANT
COVER WAND RF STERILE (DRAPES) ×2 IMPLANT
DECANTER SPIKE VIAL GLASS SM (MISCELLANEOUS) IMPLANT
DERMABOND ADVANCED (GAUZE/BANDAGES/DRESSINGS) ×1
DERMABOND ADVANCED .7 DNX12 (GAUZE/BANDAGES/DRESSINGS) ×1 IMPLANT
DRAIN CHANNEL 15F RND FF W/TCR (WOUND CARE) IMPLANT
DRAPE U-SHAPE 76X120 STRL (DRAPES) ×2 IMPLANT
DRAPE UTILITY XL STRL (DRAPES) IMPLANT
DRSG PAD ABDOMINAL 8X10 ST (GAUZE/BANDAGES/DRESSINGS) ×4 IMPLANT
DRSG TEGADERM 2-3/8X2-3/4 SM (GAUZE/BANDAGES/DRESSINGS) IMPLANT
ELECT BLADE 4.0 EZ CLEAN MEGAD (MISCELLANEOUS)
ELECT COATED BLADE 2.86 ST (ELECTRODE) ×2 IMPLANT
ELECT REM PT RETURN 9FT ADLT (ELECTROSURGICAL) ×2
ELECTRODE BLDE 4.0 EZ CLN MEGD (MISCELLANEOUS) IMPLANT
ELECTRODE REM PT RTRN 9FT ADLT (ELECTROSURGICAL) ×1 IMPLANT
EVACUATOR SILICONE 100CC (DRAIN) IMPLANT
GLOVE BIO SURGEON STRL SZ 6 (GLOVE) ×2 IMPLANT
GOWN STRL REUS W/ TWL LRG LVL3 (GOWN DISPOSABLE) ×2 IMPLANT
GOWN STRL REUS W/TWL LRG LVL3 (GOWN DISPOSABLE) ×2
MARKER SKIN DUAL TIP RULER LAB (MISCELLANEOUS) ×2 IMPLANT
NEEDLE HYPO 25X1 1.5 SAFETY (NEEDLE) ×2 IMPLANT
NS IRRIG 1000ML POUR BTL (IV SOLUTION) IMPLANT
PENCIL BUTTON HOLSTER BLD 10FT (ELECTRODE) ×2 IMPLANT
PIN SAFETY STERILE (MISCELLANEOUS) IMPLANT
SHEET MEDIUM DRAPE 40X70 STRL (DRAPES) ×2 IMPLANT
SLEEVE SCD COMPRESS KNEE MED (STOCKING) ×2 IMPLANT
STRIP CLOSURE SKIN 1/2X4 (GAUZE/BANDAGES/DRESSINGS) IMPLANT
SUT ETHILON 2 0 FS 18 (SUTURE) IMPLANT
SUT MNCRL AB 4-0 PS2 18 (SUTURE) ×8 IMPLANT
SUT MON AB 3-0 SH 27 (SUTURE) ×2
SUT MON AB 3-0 SH27 (SUTURE) ×2 IMPLANT
SUT PDS AB 2-0 CT2 27 (SUTURE) ×4 IMPLANT
SUT VIC AB 3-0 PS1 18 (SUTURE)
SUT VIC AB 3-0 PS1 18XBRD (SUTURE) IMPLANT
SUT VIC AB 3-0 SH 27 (SUTURE) ×4
SUT VIC AB 3-0 SH 27X BRD (SUTURE) ×4 IMPLANT
SUT VICRYL 4-0 PS2 18IN ABS (SUTURE) ×2 IMPLANT
SYR BULB IRRIG 60ML STRL (SYRINGE) ×2 IMPLANT
SYR CONTROL 10ML LL (SYRINGE) ×2 IMPLANT
TAPE MEASURE VINYL STERILE (MISCELLANEOUS) ×2 IMPLANT
TUBE CONNECTING 20X1/4 (TUBING) ×2 IMPLANT
UNDERPAD 30X36 HEAVY ABSORB (UNDERPADS AND DIAPERS) ×4 IMPLANT
YANKAUER SUCT BULB TIP NO VENT (SUCTIONS) ×2 IMPLANT

## 2020-05-15 NOTE — Op Note (Signed)
Breast Reduction Op note:    DATE OF PROCEDURE: 05/15/2020  LOCATION: [facility] Main Operating Room Outpatient  SURGEON: Lyndee Leo Sanger Deegan Valentino, DO  ASSISTANT: Roetta Sessions, PA  PREOPERATIVE DIAGNOSIS 1. Macromastia 2. Neck Pain 3. Back Pain  POSTOPERATIVE DIAGNOSIS 1. Macromastia 2. Neck Pain 3. Back Pain  PROCEDURES 1. Bilateral breast reduction.  Right reduction 505 g, Left reduction 938 g  COMPLICATIONS: None.  DRAINS: none  INDICATIONS FOR PROCEDURE Connie Francis is a 50 y.o. year-old female born on [birth year],with a history of symptomatic macromastia with concominant back pain, neck pain, shoulder grooving from her bra.   MRN: [id number]  CONSENT Informed consent was obtained directly from the patient. The risks, benefits and alternatives were fully discussed. Specific risks including but not limited to bleeding, infection, hematoma, seroma, scarring, pain, nipple necrosis, asymmetry, poor cosmetic results, and need for further surgery were discussed. The patient had ample opportunity to have her questions answered to her satisfaction.  DESCRIPTION OF PROCEDURE  Patient was brought into the operating room and placed in a supine position.  SCDs were placed and appropriate padding was performed.  Antibiotics were given. The patient underwent general anesthesia and the chest was prepped and draped in a sterile fashion.  A timeout was performed and all information was confirmed to be correct. Tumescent was placed in the lateral inferior portion each breast.   Right side: Preoperative markings were confirmed.  Incision lines were injected with local with epinephrine.  After waiting for vasoconstriction, the marked lines were incised. Liposuction was performed at the lateral and inferior aspect of the breast.  A Wise-pattern superomedial breast reduction was performed by de-epithelializing the pedicle, using bovie to create the superomedial pedicle, and removing breast tissue  from the superior, lateral, and inferior portions of the breast.  Care was taken to not undermine the breast pedicle. Hemostasis was achieved.  The nipple was gently rotated into position and the soft tissue closed with 4-0 Monocryl.   The pocket was irrigated and hemostasis confirmed.  The deep tissues were approximated with 3-0 PDS and Monocryl sutures and the skin was closed with deep dermal and subcuticular 4-0 Monocryl sutures.  The nipple and skin flaps had good capillary refill at the end of the procedure.    Left side: Preoperative markings were confirmed.  Incision lines were injected with local with epinephrine.  After waiting for vasoconstriction, the marked lines were incised.  Liposuction was performed laterally and inferiorly. A Wise-pattern superomedial breast reduction was performed by de-epithelializing the pedicle, using bovie to create the superomedial pedicle, and removing breast tissue from the superior, lateral, and inferior portions of the breast.  Care was taken to not undermine the breast pedicle. Hemostasis was achieved.  The nipple was gently rotated into position and the soft tissue was closed with 4-0 Monocryl.  The patient was sat upright and size and shape symmetry was confirmed.  The pocket was irrigated and hemostasis confirmed.  The deep tissues were approximated with 3-0 PDS and Monocryl sutures and the skin was closed with deep dermal and subcuticular 4-0 Monocryl sutures.  Dermabond was applied.  A breast binder and ABDs were placed.  The nipple and skin flaps had good capillary refill at the end of the procedure.  The patient tolerated the procedure well. The patient was allowed to wake from anesthesia and taken to the recovery room in satisfactory condition.  The advanced practice practitioner (APP) assisted throughout the case.  The APP was essential in retraction and  counter traction when needed to make the case progress smoothly.  This retraction and assistance made it  possible to see the tissue plans for the procedure.  The assistance was needed for blood control, tissue re-approximation and assisted with closure of the incision site.

## 2020-05-15 NOTE — Anesthesia Procedure Notes (Addendum)
Procedure Name: Intubation Date/Time: 05/15/2020 7:28 AM Performed by: Michele Rockers, CRNA Pre-anesthesia Checklist: Patient identified, Patient being monitored, Timeout performed, Emergency Drugs available and Suction available Patient Re-evaluated:Patient Re-evaluated prior to induction Oxygen Delivery Method: Circle System Utilized Preoxygenation: Pre-oxygenation with 100% oxygen Induction Type: IV induction Ventilation: Mask ventilation without difficulty Laryngoscope Size: Miller and 2 Grade View: Grade I Tube type: Oral Tube size: 7.0 mm Number of attempts: 1 Airway Equipment and Method: Stylet Placement Confirmation: ETT inserted through vocal cords under direct vision,  positive ETCO2 and breath sounds checked- equal and bilateral Secured at: 21 cm Tube secured with: Tape Dental Injury: Teeth and Oropharynx as per pre-operative assessment

## 2020-05-15 NOTE — Transfer of Care (Signed)
Immediate Anesthesia Transfer of Care Note  Patient: Connie Francis  Procedure(s) Performed: BREAST REDUCTION WITH LIPOSUCTION (Bilateral Breast)  Patient Location: PACU  Anesthesia Type:General  Level of Consciousness: awake, patient cooperative and responds to stimulation  Airway & Oxygen Therapy: Patient Spontanous Breathing and Patient connected to face mask oxygen  Post-op Assessment: Report given to RN, Post -op Vital signs reviewed and stable and Patient moving all extremities X 4  Post vital signs: Reviewed and stable  Last Vitals:  Vitals Value Taken Time  BP 135/61 05/15/20 0951  Temp 36.2 C 05/15/20 0951  Pulse 115 05/15/20 0954  Resp 18 05/15/20 0954  SpO2 99 % 05/15/20 0954  Vitals shown include unvalidated device data.  Last Pain:  Vitals:   05/15/20 0614  TempSrc:   PainSc: 0-No pain      Patients Stated Pain Goal: 6 (91/91/66 0600)  Complications: No complications documented.

## 2020-05-15 NOTE — Discharge Instructions (Addendum)
INSTRUCTIONS FOR AFTER SURGERY   You will likely have some questions about what to expect following your operation.  The following information will help you and your family understand what to expect when you are discharged from the hospital.  Following these guidelines will help ensure a smooth recovery and reduce risks of complications.  Postoperative instructions include information on: diet, wound care, medications and physical activity.  AFTER SURGERY Expect to go home after the procedure.  In some cases, you may need to spend one night in the hospital for observation.  DIET This surgery does not require a specific diet.  However, I have to mention that the healthier you eat the better your body can start healing. It is important to increasing your protein intake.  This means limiting the foods with added sugar.  Focus on fruits and vegetables and some meat. It is very important to drink water after your surgery.  If your urine is bright yellow, then it is concentrated, and you need to drink more water.  As a general rule after surgery, you should have 8 ounces of water every hour while awake.  If you find you are persistently nauseated or unable to take in liquids let us know.  NO TOBACCO USE or EXPOSURE.  This will slow your healing process and increase the risk of a wound.  WOUND CARE If you don't have a drain: You can shower the day after surgery.  Use fragrance free soap.  Dial, [place], [place] and Cetaphil are usually mild on the skin.  If you have steri-strips / tape directly attached to your skin leave them in place. It is OK to get these wet.  No baths, pools or hot tubs for two weeks. We close your incision to leave the smallest and best-looking scar. No ointment or creams on your incisions until given the go ahead.  Especially not Neosporin (Too many skin reactions with this one).  A few weeks after surgery you can use Mederma and start massaging the scar. We ask you to wear your binder or  sports bra for the first 6 weeks around the clock, including while sleeping. This provides added comfort and helps reduce the fluid accumulation at the surgery site.  ACTIVITY No heavy lifting until cleared by the doctor.  It is OK to walk and climb stairs. In fact, moving your legs is very important to decrease your risk of a blood clot.  It will also help keep you from getting deconditioned.  Every 1 to 2 hours get up and walk for 5 minutes. This will help with a quicker recovery back to normal.  Let pain be your guide so you don't do too much.  NO, you cannot do the spring cleaning and don't plan on taking care of anyone else.  This is your time for TLC.   WORK Everyone returns to work at different times. As a rough guide, most people take at least 1 - 2 weeks off prior to returning to work. If you need documentation for your job, bring the forms to your postoperative follow up visit.  DRIVING Arrange for someone to bring you home from the hospital.  You may be able to drive a few days after surgery but not while taking any narcotics or valium.  BOWEL MOVEMENTS Constipation can occur after anesthesia and while taking pain medication.  It is important to stay ahead for your comfort.  We recommend taking Milk of Magnesia (2 tablespoons; twice a day) while taking  the pain pills.  SEROMA This is fluid your body tried to put in the surgical site.  This is normal but if it creates excessive pain and swelling let us know.  It usually decreases in a few weeks.  MEDICATIONS and PAIN CONTROL At your preoperative visit for you history and physical you were given the following medications: 1. An antibiotic: Start this medication when you get home and take according to the instructions on the bottle. 2. Zofran 4 mg:  This is to treat nausea and vomiting.  You can take this every 6 hours as needed and only if needed. 3. Norco (hydrocodone/acetaminophen) 5/325 mg:  This is only to be used after you have  taken the motrin or the tylenol. Every 8 hours as needed. Over the counter Medication to take: 4. Ibuprofen (Motrin) 600 mg:  Take this every 6 hours.  If you have additional pain then take 500 mg of the tylenol.  Only take the Norco after you have tried these two. 5. Miralax or stool softener of choice: Take this according to the bottle if you take the Many Call your surgeon's office if any of the following occur: . Fever 101 degrees F or greater . Excessive bleeding or fluid from the incision site. . Pain that increases over time without aid from the medications . Redness, warmth, or pus draining from incision sites . Persistent nausea or inability to take in liquids . Severe misshapen area that underwent the operation.

## 2020-05-15 NOTE — Anesthesia Postprocedure Evaluation (Signed)
Anesthesia Post Note  Patient: Connie Francis  Procedure(s) Performed: BREAST REDUCTION WITH LIPOSUCTION (Bilateral Breast)     Patient location during evaluation: PACU Anesthesia Type: General Level of consciousness: awake and alert Pain management: pain level controlled Vital Signs Assessment: post-procedure vital signs reviewed and stable Respiratory status: spontaneous breathing, nonlabored ventilation, respiratory function stable and patient connected to nasal cannula oxygen Cardiovascular status: blood pressure returned to baseline and stable Postop Assessment: no apparent nausea or vomiting Anesthetic complications: no   No complications documented.  Last Vitals:  Vitals:   05/15/20 1006 05/15/20 1020  BP: 137/64 129/76  Pulse: (!) 113 (!) 112  Resp: 20 15  Temp:  36.4 C  SpO2: 98% 99%    Last Pain:  Vitals:   05/15/20 1020  TempSrc:   PainSc: 0-No pain                 Judah Carchi COKER

## 2020-05-15 NOTE — Interval H&P Note (Signed)
History and Physical Interval Note:  05/15/2020 7:12 AM  Connie Francis  has presented today for surgery, with the diagnosis of mammary hypertrophy.  The various methods of treatment have been discussed with the patient and family. After consideration of risks, benefits and other options for treatment, the patient has consented to  Procedure(s): BREAST REDUCTION WITH LIPOSUCTION (Bilateral) as a surgical intervention.  The patient's history has been reviewed, patient examined, no change in status, stable for surgery.  I have reviewed the patient's chart and labs.  Questions were answered to the patient's satisfaction.     [name]

## 2020-05-16 ENCOUNTER — Encounter: Payer: No Typology Code available for payment source | Admitting: Surgical

## 2020-05-16 ENCOUNTER — Encounter (HOSPITAL_COMMUNITY): Payer: Self-pay | Admitting: Plastic Surgery

## 2020-05-16 LAB — SURGICAL PATHOLOGY

## 2020-05-20 MED FILL — Amlodipine Besylate Tab 2.5 MG (Base Equivalent): ORAL | 30 days supply | Qty: 30 | Fill #1 | Status: AC

## 2020-05-20 MED FILL — Estradiol Tab 1 MG: ORAL | 30 days supply | Qty: 30 | Fill #1 | Status: AC

## 2020-05-22 ENCOUNTER — Other Ambulatory Visit: Payer: Self-pay

## 2020-05-23 ENCOUNTER — Ambulatory Visit (INDEPENDENT_AMBULATORY_CARE_PROVIDER_SITE_OTHER): Payer: No Typology Code available for payment source | Admitting: Surgical

## 2020-05-23 ENCOUNTER — Other Ambulatory Visit: Payer: Self-pay

## 2020-05-23 DIAGNOSIS — G8929 Other chronic pain: Secondary | ICD-10-CM

## 2020-05-23 DIAGNOSIS — N62 Hypertrophy of breast: Secondary | ICD-10-CM

## 2020-05-23 DIAGNOSIS — M546 Pain in thoracic spine: Secondary | ICD-10-CM

## 2020-05-23 DIAGNOSIS — M542 Cervicalgia: Secondary | ICD-10-CM

## 2020-05-23 NOTE — Progress Notes (Signed)
Patient is a 50 year old female here for follow-up after bilateral breast reduction with liposuction on 06/04/2020 with Dr. Marla Roe.  She had 505 g removed from the right breast and 533 g removed from the left breast.  Patient reports that overall she is doing well.  She reports that she is having some itching but has been using Benadryl and this is improving.  She reports that she is having some oozing from her incisions that she noticed on her bra.  She reports otherwise she is doing well.  She reports that she is not having any infectious symptoms.  She has been moving her bowels normally now.  Chaperone present on exam On exam bilateral NAC's are viable.  Bilateral breast incisions are intact.  Steri-Strips and honeycomb dressings in place with very little drainage noted.  No cellulitic changes noted.  Some bruising noted along the lateral breasts, likely from liposuction.  Some swelling still present.  No signs of hematoma noted.  Recommend continue her compressive garment 24/7.  Continue to avoid strenuous activities.  Recommend following up in 1 to 2 weeks for reevaluation.  Call with questions or concerns.

## 2020-05-24 ENCOUNTER — Encounter: Payer: Self-pay | Admitting: Plastic Surgery

## 2020-05-29 ENCOUNTER — Other Ambulatory Visit (HOSPITAL_COMMUNITY): Payer: No Typology Code available for payment source

## 2020-05-30 ENCOUNTER — Encounter: Payer: Self-pay | Admitting: Plastic Surgery

## 2020-05-30 ENCOUNTER — Ambulatory Visit (INDEPENDENT_AMBULATORY_CARE_PROVIDER_SITE_OTHER): Payer: No Typology Code available for payment source | Admitting: Plastic Surgery

## 2020-05-30 ENCOUNTER — Other Ambulatory Visit: Payer: Self-pay

## 2020-05-30 DIAGNOSIS — M546 Pain in thoracic spine: Secondary | ICD-10-CM

## 2020-05-30 DIAGNOSIS — N62 Hypertrophy of breast: Secondary | ICD-10-CM

## 2020-05-30 DIAGNOSIS — G8929 Other chronic pain: Secondary | ICD-10-CM

## 2020-05-30 NOTE — Progress Notes (Signed)
The patient is a 50 year old female here for follow-up after undergoing bilateral breast reduction with liposuction.  She is doing really well.  There is no sign of hematoma or seroma.  She has noticed some itching at the inframammary fold.  I believe this is the healing process.  She took the honeycomb off last night.  She is pleased with the size and understands that she does have a little bit of swelling still.  Keep the Steri-Strips on until they fall off.  She can trim them as they start to get stuck in her clothes.  We will see her back in a couple months for follow-up.  Continue with the sports bra.  Call if he has any questions or concerns.  Pictures were obtained of the patient and placed in the chart with the patient's or guardian's permission.

## 2020-06-09 ENCOUNTER — Encounter: Payer: No Typology Code available for payment source | Admitting: Plastic Surgery

## 2020-06-19 ENCOUNTER — Other Ambulatory Visit: Payer: Self-pay | Admitting: Internal Medicine

## 2020-06-20 ENCOUNTER — Other Ambulatory Visit: Payer: Self-pay

## 2020-06-20 MED ORDER — AMLODIPINE BESYLATE 2.5 MG PO TABS
ORAL_TABLET | Freq: Every day | ORAL | 5 refills | Status: DC
  Filled 2020-06-20: qty 30, 30d supply, fill #0
  Filled 2020-07-17: qty 30, 30d supply, fill #1
  Filled 2020-08-20: qty 30, 30d supply, fill #2
  Filled 2020-09-19: qty 30, 30d supply, fill #3
  Filled 2020-10-16: qty 30, 30d supply, fill #4
  Filled 2020-11-16: qty 30, 30d supply, fill #5

## 2020-06-20 MED ORDER — ESTRADIOL 1 MG PO TABS
ORAL_TABLET | Freq: Every day | ORAL | 2 refills | Status: DC
  Filled 2020-06-20: qty 30, 30d supply, fill #0
  Filled 2020-07-17: qty 30, 30d supply, fill #1
  Filled 2020-08-20: qty 30, 30d supply, fill #2

## 2020-06-22 ENCOUNTER — Other Ambulatory Visit: Payer: Self-pay

## 2020-06-22 MED ORDER — AMPHETAMINE-DEXTROAMPHET ER 5 MG PO CP24
ORAL_CAPSULE | ORAL | 0 refills | Status: DC
  Filled 2020-06-22: qty 90, 90d supply, fill #0

## 2020-06-23 ENCOUNTER — Encounter: Payer: No Typology Code available for payment source | Admitting: Surgical

## 2020-06-23 ENCOUNTER — Other Ambulatory Visit: Payer: Self-pay

## 2020-06-29 NOTE — Progress Notes (Signed)
patient is a 50 year old female here for follow-up after bilateral breast reduction on 05/15/2020.  She is 6 weeks postop.  She reports overall she is doing really well.  She reports the Steri-Strips have still not come off.  She has been moving into her new home and has been doing some lifting and this has been going fine.  Chaperone present on exam On exam bilateral NAC's are viable.  Bilateral breast incisions are intact.  No erythema.  No subcutaneous fluid collections noted.  Incisions are well-healed.  No restrictions at this time.  Recommend continued to wear sports bra during the day for approximately 1 more month.  There is no sign of infection, seroma, hematoma.  Recommend following up on an as-needed basis.  We remain available as needed for any questions.  Pictures were taken and placed in the patient's chart with patient's permission.  Avoid bra with underwire.  She can begin using scar creams

## 2020-06-30 ENCOUNTER — Ambulatory Visit (INDEPENDENT_AMBULATORY_CARE_PROVIDER_SITE_OTHER): Payer: No Typology Code available for payment source | Admitting: Surgical

## 2020-06-30 ENCOUNTER — Other Ambulatory Visit: Payer: Self-pay

## 2020-06-30 DIAGNOSIS — M546 Pain in thoracic spine: Secondary | ICD-10-CM

## 2020-06-30 DIAGNOSIS — G8929 Other chronic pain: Secondary | ICD-10-CM

## 2020-06-30 DIAGNOSIS — N62 Hypertrophy of breast: Secondary | ICD-10-CM

## 2020-06-30 DIAGNOSIS — M542 Cervicalgia: Secondary | ICD-10-CM

## 2020-06-30 MED ORDER — CITALOPRAM HYDROBROMIDE 10 MG PO TABS
10.0000 mg | ORAL_TABLET | Freq: Every day | ORAL | 0 refills | Status: DC
  Filled 2020-06-30: qty 90, 90d supply, fill #0

## 2020-06-30 MED ORDER — AMPHETAMINE-DEXTROAMPHET ER 5 MG PO CP24
5.0000 mg | ORAL_CAPSULE | Freq: Every day | ORAL | 0 refills | Status: DC
  Filled 2020-06-30: qty 90, 90d supply, fill #0

## 2020-07-03 ENCOUNTER — Other Ambulatory Visit: Payer: Self-pay

## 2020-07-18 ENCOUNTER — Other Ambulatory Visit: Payer: Self-pay

## 2020-08-21 ENCOUNTER — Other Ambulatory Visit: Payer: Self-pay

## 2020-08-31 ENCOUNTER — Encounter: Payer: Self-pay | Admitting: Plastic Surgery

## 2020-09-19 ENCOUNTER — Other Ambulatory Visit: Payer: Self-pay | Admitting: Internal Medicine

## 2020-09-20 ENCOUNTER — Other Ambulatory Visit: Payer: Self-pay

## 2020-09-20 MED ORDER — ESTRADIOL 1 MG PO TABS
ORAL_TABLET | Freq: Every day | ORAL | 2 refills | Status: DC
  Filled 2020-09-20: qty 30, 30d supply, fill #0
  Filled 2020-10-16: qty 30, 30d supply, fill #1
  Filled 2020-11-16: qty 30, 30d supply, fill #2

## 2020-09-22 ENCOUNTER — Other Ambulatory Visit: Payer: Self-pay

## 2020-09-22 MED ORDER — MELATONIN 5 MG SL SUBL: SUBLINGUAL_TABLET | SUBLINGUAL | 0 refills | Status: DC

## 2020-09-22 MED ORDER — AMPHETAMINE-DEXTROAMPHET ER 5 MG PO CP24
ORAL_CAPSULE | ORAL | 0 refills | Status: DC
  Filled 2020-09-22: qty 90, 90d supply, fill #0

## 2020-09-22 MED ORDER — CITALOPRAM HYDROBROMIDE 10 MG PO TABS
ORAL_TABLET | ORAL | 0 refills | Status: DC
  Filled 2020-09-22: qty 90, 90d supply, fill #0

## 2020-09-22 MED ORDER — ALPRAZOLAM 0.25 MG PO TABS
ORAL_TABLET | ORAL | 2 refills | Status: DC
  Filled 2020-09-22: qty 8, 30d supply, fill #0

## 2020-10-04 ENCOUNTER — Telehealth: Payer: Self-pay | Admitting: *Deleted

## 2020-10-04 ENCOUNTER — Encounter (INDEPENDENT_AMBULATORY_CARE_PROVIDER_SITE_OTHER): Payer: Self-pay

## 2020-10-04 ENCOUNTER — Telehealth: Payer: No Typology Code available for payment source | Admitting: Physician Assistant

## 2020-10-04 ENCOUNTER — Other Ambulatory Visit: Payer: Self-pay

## 2020-10-04 DIAGNOSIS — U071 COVID-19: Secondary | ICD-10-CM

## 2020-10-04 MED ORDER — CARESTART COVID-19 HOME TEST VI KIT
PACK | 0 refills | Status: DC
  Filled 2020-10-04: qty 2, 4d supply, fill #0

## 2020-10-04 MED ORDER — BENZONATATE 100 MG PO CAPS
100.0000 mg | ORAL_CAPSULE | Freq: Three times a day (TID) | ORAL | 0 refills | Status: DC | PRN
  Filled 2020-10-04: qty 30, 10d supply, fill #0

## 2020-10-04 NOTE — Telephone Encounter (Signed)
Addressed in telephone encounter.

## 2020-10-04 NOTE — Progress Notes (Signed)
I have spent 5 minutes in review of e-visit questionnaire, review and updating patient chart, medical decision making and response to patient.   Genelle Economou Cody Itay Mella, PA-C    

## 2020-10-04 NOTE — Progress Notes (Signed)

## 2020-10-04 NOTE — Telephone Encounter (Signed)
BPA triggered for new cough, weakness. Pt states she misread questionnaire, symptoms are not new or worsening. Pt also had Evisit today. Advised to alert PCP for worsening symptoms. Verbalizes understanding.

## 2020-10-07 ENCOUNTER — Encounter: Payer: Self-pay | Admitting: Nurse Practitioner

## 2020-10-07 ENCOUNTER — Telehealth: Payer: No Typology Code available for payment source | Admitting: Nurse Practitioner

## 2020-10-07 DIAGNOSIS — J0140 Acute pansinusitis, unspecified: Secondary | ICD-10-CM | POA: Diagnosis not present

## 2020-10-07 DIAGNOSIS — U071 COVID-19: Secondary | ICD-10-CM

## 2020-10-07 MED ORDER — DOXYCYCLINE HYCLATE 100 MG PO TABS: 100.0000 mg | ORAL_TABLET | Freq: Two times a day (BID) | ORAL | 0 refills | Status: AC

## 2020-10-07 NOTE — Progress Notes (Signed)
    E-Visit for Corona Virus Screening  Based on what you have shared with me, you need to do a urgent care video visit. You have covid and the symptoms you are experiencing are form covid. It doe snot appear that you have been treated with an antiviral. The antiviral will help with the symptoms you are having. You only have  5 day window to be treated with antiviral. In order to get an antiviral , you ned to go into my chart and schedule a virtual urgent care visit. We cin only give antivirals in a video visit.  If you are having a true medical emergency please call 911.   Based on what you shared with me, I feel your condition warrants further evaluation as soon as possible at an Emergency department.    NOTE: There will be NO CHARGE for this eVisit   If you are having a true medical emergency please call 911.      Emergency [facility]  Get Driving Directions  [phone]  [address]  [place], [place] [address]  Open 24/7/365      [facility] Emergency Department at [place]  [address]  [place], [place] [address]  Open 24/7/365    Emergency [facility]  Get Driving Directions  [phone]  [address], [place] [address]  Open 24/7/365      [facility]  Get Driving Directions  [phone]  [address]  [place], [place] [address]  Open 24/7/365    [facility]  Emergency [place]  Get Driving Directions  [phone]  [place], [place] [address]  Open 24/7/365    [place]  Get Driving Directions  [address]  [place], [place] [address]  Open 24/7/365    [facility]  Emergency [facility]  Get Driving Directions  [phone]  [address]  [place], [place] [address]  Open 24/7/365

## 2020-10-07 NOTE — Progress Notes (Signed)
Virtual Visit Consent   Connie Francis, you are scheduled for a virtual visit with a [facility] provider today.     Just as with appointments in the office, your consent must be obtained to participate.  Your consent will be active for this visit and any virtual visit you may have with one of our providers in the next 365 days.     If you have a MyChart account, a copy of this consent can be sent to you electronically.  All virtual visits are billed to your insurance company just like a traditional visit in the office.    As this is a virtual visit, video technology does not allow for your provider to perform a traditional examination.  This may limit your provider's ability to fully assess your condition.  If your provider identifies any concerns that need to be evaluated in person or the need to arrange testing (such as labs, EKG, etc.), we will make arrangements to do so.     Although advances in technology are sophisticated, we cannot ensure that it will always work on either your end or our end.  If the connection with a video visit is poor, the visit may have to be switched to a telephone visit.  With either a video or telephone visit, we are not always able to ensure that we have a secure connection.     I need to obtain your verbal consent now.   Are you willing to proceed with your visit today?    Connie Francis has provided verbal consent on 10/07/2020 for a virtual visit (video or telephone).   Apolonio Schneiders, FNP   Date: 10/07/2020 1:56 PM   Virtual Visit via Video Note   I, Apolonio Schneiders, connected with  Connie Francis  ([id number], Jul 20, 1970) on 10/07/20 at  2:00 PM EDT by a video-enabled telemedicine application and verified that I am speaking with the correct person using two identifiers.  Location: Patient: Virtual Visit Location Patient: Home Provider: Virtual Visit Location Provider: Office/Clinic   I discussed the limitations of evaluation and  management by telemedicine and the availability of in person appointments. The patient expressed understanding and agreed to proceed.    History of Present Illness: Connie Francis is a 50 y.o. who identifies as a female who was assigned female at birth, and is being seen today after testing positive for COVID 4 days ago, her symptoms started 5 days ago.   She has had a fever, body aches. Fever stopped 24 hours ago.  Since yesterday she has had worsening congestion She has been using Dayquil and Nyquil since yesterday She has also used Nasonex as well She feels like her nose is on fire.   She has not had COVID in the past She has been vaccinated with a booster.    Denies a history of asthma.  She does have HTN   Problems:  Patient Active Problem List   Diagnosis Date Noted   Breast hypertrophy in female 11/21/2019   Muscle cramps 82/50/5397   Colicky RUQ abdominal pain 02/04/2019   Night sweats 11/15/2018   Long-term use of high-risk medication 08/13/2018   Back pain 10/05/2017   Chronic constipation 12/17/2016   Endometriosis 12/17/2016   Recurrent headache 01/06/2016   IBS (irritable bowel syndrome) 07/17/2014   Status post TVH 07/15/2014   Cyst (solitary) of breast 07/12/2013   Essential hypertension 01/18/2013   Neck pain 08/14/2012   Visit for preventive health examination 06/18/2012  Acquired cyst of kidney 02/18/2012   H/O urinary stone 02/18/2012   Depression with anxiety 01/07/2012   Obsessive compulsive disorder     Allergies:  Allergies  Allergen Reactions   Latex     Swelling and itching    Lisinopril Cough    cough   Penicillins Swelling    Reaction: 1996   Promethazine Other (See Comments)    Drowsy   Shellfish Allergy     hives   Sulfa Antibiotics     Rash and itching    Lamictal [Lamotrigine] Rash    Blisters    Medications:  Current Outpatient Medications:    ALPRAZolam (XANAX) 0.25 MG tablet, Take 1 daily as needed, Disp: 8  tablet, Rfl: 2   amLODipine (NORVASC) 2.5 MG tablet, TAKE 1 TABLET (2.5 MG TOTAL) BY MOUTH DAILY., Disp: 30 tablet, Rfl: 5   amphetamine-dextroamphetamine (ADDERALL XR) 5 MG 24 hr capsule, Take 1 capsule (5 mg total) by mouth daily with breakfast., Disp: 90 capsule, Rfl: 0   amphetamine-dextroamphetamine (ADDERALL XR) 5 MG 24 hr capsule, Take 1 daily with breakfast, Disp: 90 capsule, Rfl: 0   benzonatate (TESSALON) 100 MG capsule, Take 1 capsule (100 mg total) by mouth 3 (three) times daily as needed for cough., Disp: 30 capsule, Rfl: 0   citalopram (CELEXA) 10 MG tablet, Take 10 mg by mouth at bedtime., Disp: , Rfl:    citalopram (CELEXA) 10 MG tablet, Take 1 tablet by mouth daily with breakfast, Disp: 90 tablet, Rfl: 0   citalopram (CELEXA) 10 MG tablet, Take 1 tablet (10 mg total) by mouth daily after breakfast., Disp: 90 tablet, Rfl: 0   citalopram (CELEXA) 10 MG tablet, Take 1 daily with breakfast, Disp: 90 tablet, Rfl: 0   COVID-19 At Home Antigen Test (CARESTART COVID-19 HOME TEST) KIT, use as directed, Disp: 2 kit, Rfl: 0   COVID-19 mRNA vaccine, Pfizer, 30 MCG/0.3ML injection, USE AS DIRECTED, Disp: .3 mL, Rfl: 0   estradiol (ESTRACE) 1 MG tablet, TAKE 1 TABLET BY MOUTH DAILY., Disp: 30 tablet, Rfl: 2   hyoscyamine (ANASPAZ) 0.125 MG TBDP disintergrating tablet, Place 1 tablet (0.125 mg total) under the tongue every 6 (six) hours as needed. For abdominal /esophageal pain, Disp: 30 tablet, Rfl: 0   Melatonin ([employer] MELATONIN) 5 MG SUBL, Take 1 at bedtime as needed, Disp: 90 tablet, Rfl: 0   ondansetron (ZOFRAN) 4 MG tablet, Take 1 tablet (4 mg total) by mouth every 8 (eight) hours as needed for nausea or vomiting., Disp: 20 tablet, Rfl: 0   polyethylene glycol (MIRALAX / GLYCOLAX) packet, Take 17 g by mouth daily as needed for mild constipation., Disp: , Rfl:    Probiotic Product (PROBIOTIC DAILY PO), Take 2 capsules by mouth daily with supper. Gummie, Disp: , Rfl:    Observations/Objective: Patient is well-developed, well-nourished in no acute distress.  Resting comfortably at home.  Head is normocephalic, atraumatic.  No labored breathing.  Speech is clear and coherent with logical content.  Patient is alert and oriented at baseline.    Assessment and Plan: 1. COVID-19 Continue to use over the counter medications for support of symptoms as discussed.  Push fluids, rest and assure caloric intake for recovery Seek medical attention for any new or worsening symptoms   2. Acute non-recurrent pansinusitis  - doxycycline (VIBRA-TABS) 100 MG tablet; Take 1 tablet (100 mg total) by mouth 2 (two) times daily for 7 days.  Dispense: 14 tablet; Refill: 0  Follow Up Instructions: I discussed the assessment and treatment plan with the patient. The patient was provided an opportunity to ask questions and all were answered. The patient agreed with the plan and demonstrated an understanding of the instructions.  A copy of instructions were sent to the patient via MyChart unless otherwise noted below.     The patient was advised to call back or seek an in-person evaluation if the symptoms worsen or if the condition fails to improve as anticipated.  Time:  I spent 10 minutes with the patient via telehealth technology discussing the above problems/concerns.    Apolonio Schneiders, FNP

## 2020-10-08 ENCOUNTER — Telehealth: Payer: Self-pay | Admitting: *Deleted

## 2020-10-08 NOTE — Telephone Encounter (Signed)
My Chart message sent to patient to address symptoms of cough and patient reporting now getting better. Addressed suggestions for appetite that is the same. Continue to monitor covid questionnaire responses. My Chart message sent to patient on 10/08/20 at 7:11 am. Patient recommended to contact PCP if symptoms worsen.

## 2020-10-16 ENCOUNTER — Other Ambulatory Visit: Payer: Self-pay

## 2020-10-17 ENCOUNTER — Other Ambulatory Visit: Payer: Self-pay

## 2020-10-17 MED ORDER — CITALOPRAM HYDROBROMIDE 10 MG PO TABS
10.0000 mg | ORAL_TABLET | Freq: Every day | ORAL | 0 refills | Status: DC
  Filled 2020-10-17: qty 90, 90d supply, fill #0

## 2020-11-17 ENCOUNTER — Other Ambulatory Visit: Payer: Self-pay

## 2020-11-20 ENCOUNTER — Encounter: Payer: Self-pay | Admitting: Internal Medicine

## 2020-11-20 ENCOUNTER — Ambulatory Visit (INDEPENDENT_AMBULATORY_CARE_PROVIDER_SITE_OTHER): Payer: No Typology Code available for payment source | Admitting: Internal Medicine

## 2020-11-20 ENCOUNTER — Encounter: Payer: Self-pay | Admitting: Plastic Surgery

## 2020-11-20 ENCOUNTER — Other Ambulatory Visit: Payer: Self-pay

## 2020-11-20 VITALS — BP 118/78 | HR 74 | Temp 97.1°F | Ht 62.0 in | Wt 135.8 lb

## 2020-11-20 DIAGNOSIS — N6459 Other signs and symptoms in breast: Secondary | ICD-10-CM | POA: Diagnosis not present

## 2020-11-20 DIAGNOSIS — Z0001 Encounter for general adult medical examination with abnormal findings: Secondary | ICD-10-CM

## 2020-11-20 DIAGNOSIS — R635 Abnormal weight gain: Secondary | ICD-10-CM | POA: Insufficient documentation

## 2020-11-20 LAB — TSH: TSH: 0.7 u[IU]/mL (ref 0.35–5.50)

## 2020-11-20 LAB — LIPID PANEL
Cholesterol: 198 mg/dL (ref 0–200)
HDL: 69 mg/dL
LDL Cholesterol: 109 mg/dL — ABNORMAL HIGH (ref 0–99)
NonHDL: 129.37
Total CHOL/HDL Ratio: 3
Triglycerides: 103 mg/dL (ref 0.0–149.0)
VLDL: 20.6 mg/dL (ref 0.0–40.0)

## 2020-11-20 LAB — COMPREHENSIVE METABOLIC PANEL
ALT: 8 U/L (ref 0–35)
AST: 14 U/L (ref 0–37)
Albumin: 4.1 g/dL (ref 3.5–5.2)
Alkaline Phosphatase: 75 U/L (ref 39–117)
BUN: 12 mg/dL (ref 6–23)
CO2: 30 mEq/L (ref 19–32)
Calcium: 9 mg/dL (ref 8.4–10.5)
Chloride: 101 mEq/L (ref 96–112)
Creatinine, Ser: 0.68 mg/dL (ref 0.40–1.20)
GFR: 103.09 mL/min (ref >60.00)
Glucose, Bld: 85 mg/dL (ref 70–99)
Potassium: 3.8 mEq/L (ref 3.5–5.1)
Sodium: 138 mEq/L (ref 135–145)
Total Bilirubin: 0.4 mg/dL (ref 0.2–1.2)
Total Protein: 6.9 g/dL (ref 6.0–8.3)

## 2020-11-20 NOTE — Assessment & Plan Note (Signed)
Exercise and low GI diet recommended.  screening labs ordered

## 2020-11-20 NOTE — Assessment & Plan Note (Signed)
Left breast is draining,  May be secondary to retained suture but need to rule out abscess, diagnositc mammogram ordered

## 2020-11-20 NOTE — Progress Notes (Signed)
Patient ID: Connie Francis, female    DOB: February 26, 1970  Age: 50 y.o. MRN: [id number]  The patient is here for annual preventive examination and management of other chronic and acute problems.  This visit occurred during the SARS-CoV-2 public health emergency.  Safety protocols were in place, including screening questions prior to the visit, additional usage of staff PPE, and extensive cleaning of exam room while observing appropriate contact time as indicated for disinfecting solutions.     The risk factors are reflected in the social history.  The roster of all physicians providing medical care to patient - is listed in the Snapshot section of the chart.  Activities of daily living:  The patient is 100% independent in all ADLs: dressing, toileting, feeding as well as independent mobility  Home safety : The patient has smoke detectors in the home. They wear seatbelts.  There are no firearms at home. There is no violence in the home.   There is no risks for hepatitis, STDs or HIV. There is no   history of blood transfusion. They have no travel history to infectious disease endemic areas of the world.  The patient has seen their dentist in the last six month. They have seen their eye doctor in the last year. They admit to slight hearing difficulty with regard to whispered voices and some television programs.  They have deferred audiologic testing in the last year.  They do not  have excessive sun exposure. Discussed the need for sun protection: hats, long sleeves and use of sunscreen if there is significant sun exposure.   Diet: the importance of a healthy diet is discussed. They do have a healthy diet.  The benefits of regular aerobic exercise were discussed. She  does not exercise   Depression screen: there are no signs or vegative symptoms of depression- irritability, change in appetite, anhedonia, sadness/tearfullness.  Cognitive assessment: the patient manages all their financial and  personal affairs and is actively engaged. They could relate day,date,year and events; recalled 2/3 objects at 3 minutes; performed clock-face test normally.  The following portions of the patient's history were reviewed and updated as appropriate: allergies, current medications, past family history, past medical history,  past surgical history, past social history  and problem list.  Visual acuity was not assessed per patient preference since she has regular follow up with her ophthalmologist. Hearing and body mass index were assessed and reviewed.   During the course of the visit the patient was educated and counseled about appropriate screening and preventive services including : fall prevention , diabetes screening, nutrition counseling, colorectal cancer screening, and recommended immunizations.    CC: The primary encounter diagnosis was Abnormal breast exam. Diagnoses of Weight gain and Encounter for general adult medical examination with abnormal findings were also pertinent to this visit.   1) breast drainage for the past week,  had breast reduction 6 months ago by Connie Francis  which reduced her from a 23 E to a 67 DD . Post exam with a manual exam  and area is along the suture line  not odorous,  mostly clear with a tinge of yellow.  No redness  no fever . Mammogram is due now. Disappointed that they could not be reduce more.   2) s/p TAH  3) wants to lose 10 lbs  drinks 3 pepsis daily  and junk food snacks.. meals are small.  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   She has a past surgical history that includes Dilation and curettage of uterus (2006); Cesarean section; Upper endoscopy w/ esophageal manometry; Tubal ligation; Esophagogastroduodenoscopy (egd) with propofol (N/A, 07/27/2014); Colonoscopy with propofol (N/A, 07/27/2014); colonscopy (07/27/2015); Vaginal hysterectomy; Breast cyst aspiration (Left); and Breast reduction surgery (Bilateral, 05/15/2020).   Her family history includes Alcohol abuse in her maternal grandmother; Arthritis in her maternal grandfather; Cancer in her maternal aunt and paternal uncle; Esophageal cancer (age of onset: 38) in her cousin; Hearing loss in her maternal grandmother; Hypertension in her mother; Stroke in her paternal aunt.She reports that she has never smoked. She has never used smokeless tobacco. She reports that she does not currently use alcohol after a past usage of about 1.0 standard drink per week. She reports that she does not use drugs.  Outpatient Medications Prior to Visit  Medication Sig Dispense Refill   ALPRAZolam (XANAX) 0.25 MG tablet Take 1 daily as needed 8 tablet 2   amLODipine (NORVASC) 2.5 MG tablet TAKE 1 TABLET (2.5 MG TOTAL) BY MOUTH DAILY. 30 tablet 5   amphetamine-dextroamphetamine (ADDERALL XR) 5 MG 24 hr capsule Take 1 capsule (5 mg total) by mouth daily with breakfast. 90 capsule 0   amphetamine-dextroamphetamine (ADDERALL XR) 5 MG 24 hr capsule Take 1 daily with breakfast 90 capsule 0   citalopram (CELEXA) 10 MG tablet Take 1 tablet (10 mg total) by mouth daily with breakfast. 90 tablet 0   estradiol (ESTRACE) 1 MG tablet TAKE 1 TABLET BY MOUTH DAILY. 30 tablet 2   hyoscyamine (ANASPAZ) 0.125 MG TBDP disintergrating tablet Place 1 tablet (0.125 mg total) under the tongue every 6 (six) hours as needed. For abdominal /esophageal pain 30 tablet 0   Melatonin ([employer] MELATONIN) 5 MG SUBL Take 1  at bedtime as needed 90 tablet 0   ondansetron (ZOFRAN) 4 MG tablet Take 1 tablet (4 mg total) by mouth every 8 (eight) hours as needed for nausea or vomiting. 20 tablet 0   polyethylene glycol (MIRALAX / GLYCOLAX) packet Take 17 g by mouth daily as needed for mild constipation.     Probiotic Product (PROBIOTIC DAILY PO) Take 2 capsules by mouth daily with supper. Gummie     benzonatate (TESSALON) 100 MG capsule Take 1 capsule (100 mg total) by mouth 3 (three) times daily as needed for cough. (Patient not taking: Reported on 11/20/2020) 30 capsule 0   citalopram (CELEXA) 10 MG tablet Take 10 mg by mouth at bedtime. (Patient not taking: Reported on 11/20/2020)     citalopram (CELEXA) 10 MG tablet Take 1 tablet by mouth daily with breakfast (Patient not taking: Reported on 11/20/2020) 90 tablet 0   citalopram (CELEXA) 10 MG tablet Take 1 tablet (10 mg total) by mouth daily after breakfast. (Patient not taking: Reported on 11/20/2020) 90 tablet 0   citalopram (CELEXA) 10 MG tablet Take 1 daily with breakfast (Patient not taking: Reported on 11/20/2020) 90 tablet 0   COVID-19 At Home Antigen Test (CARESTART COVID-19 HOME TEST) KIT use as directed (Patient not taking: Reported on 11/20/2020) 2 kit 0   COVID-19 mRNA vaccine, Pfizer, 30 MCG/0.3ML injection USE AS DIRECTED (Patient not taking: Reported  on 11/20/2020) .3 mL 0   No facility-administered medications prior to visit.    Review of Systems  Patient denies headache, fevers, malaise, unintentional weight loss, skin rash, eye pain, sinus congestion and sinus pain, sore throat, dysphagia,  hemoptysis , cough, dyspnea, wheezing, chest pain, palpitations, orthopnea, edema, abdominal pain, nausea, melena, diarrhea, constipation, flank pain, dysuria, hematuria, urinary  Frequency, nocturia, numbness, tingling, seizures,  Focal weakness, Loss of consciousness,  Tremor, insomnia, depression, anxiety, and suicidal ideation.     Objective:  BP 118/78 (BP  Location: Left Arm, Patient Position: Sitting, Cuff Size: Normal)   Pulse 74   Temp (!) 97.1 F (36.2 C) (Temporal)   Ht 5' 2"  (1.575 m)   Wt 135 lb 12.8 oz (61.6 kg)   SpO2 98%   BMI 24.84 kg/m   Physical Exam  General appearance: alert, cooperative and appears stated age Head: Normocephalic, without obvious abnormality, atraumatic Eyes: conjunctivae/corneas clear. PERRL, EOM's intact. Fundi benign. Ears: normal TM's and external ear canals both ears Nose: Nares normal. Septum midline. Mucosa normal. No drainage or sinus tenderness. Throat: lips, mucosa, and tongue normal; teeth and gums normal Neck: no adenopathy, no carotid bruit, no JVD, supple, symmetrical, trachea midline and thyroid not enlarged, symmetric, no tenderness/mass/nodules Lungs: clear to auscultation bilaterally Breasts: bilateral well healed surgical scars,  left breast with small opening draining spot at superior end of surgical scar , no reythema no masses or tenderness Heart: regular rate and rhythm, S1, S2 normal, no murmur, click, rub or gallop Abdomen: soft, non-tender; bowel sounds normal; no masses,  no organomegaly Extremities: extremities normal, atraumatic, no cyanosis or edema Pulses: 2+ and symmetric Skin: Skin color, texture, turgor normal. No rashes or lesions Neurologic: Alert and oriented X 3, normal strength and tone. Normal symmetric reflexes. Normal coordination and gait.    Assessment & Plan:   Problem List Items Addressed This Visit     Encounter for general adult medical examination with abnormal findings    Left breast is draining,  May be secondary to retained suture but need to rule out abscess, diagnositc mammogram ordered       Weight gain    Exercise and low GI diet recommended.  screening labs ordered       Relevant Orders   Lipid panel   TSH   Comprehensive metabolic panel   Other Visit Diagnoses     Abnormal breast exam    -  Primary   Relevant Orders   MM DIAG  BREAST TOMO BILATERAL   US BREAST LTD UNI LEFT INC AXILLA       I have discontinued Jocilynn T. Murguia's COVID-19 mRNA vaccine ([place]), benzonatate, and Carestart COVID-19 Home Test. I am also having her maintain her polyethylene glycol, hyoscyamine, ondansetron, Probiotic Product (PROBIOTIC DAILY PO), amLODipine, amphetamine-dextroamphetamine, estradiol, amphetamine-dextroamphetamine, Melatonin, ALPRAZolam, and citalopram.  No orders of the defined types were placed in this encounter.   Medications Discontinued During This Encounter  Medication Reason   benzonatate (TESSALON) 100 MG capsule    citalopram (CELEXA) 10 MG tablet Duplicate   citalopram (CELEXA) 10 MG tablet Duplicate   citalopram (CELEXA) 10 MG tablet Duplicate   citalopram (CELEXA) 10 MG tablet Duplicate   [id number] At Home Antigen Test (CARESTART COVID-19 HOME TEST) KIT    COVID-19 mRNA vaccine, Pfizer, 30 MCG/0.3ML injection Error    Follow-up: No follow-ups on file.   Crecencio Mc, MD

## 2020-11-20 NOTE — Patient Instructions (Signed)
Diagnostic mammogram and [place] of left breast ordered

## 2020-12-17 ENCOUNTER — Other Ambulatory Visit: Payer: Self-pay | Admitting: Internal Medicine

## 2020-12-18 ENCOUNTER — Other Ambulatory Visit: Payer: Self-pay | Admitting: Internal Medicine

## 2020-12-18 ENCOUNTER — Other Ambulatory Visit: Payer: Self-pay

## 2020-12-18 MED ORDER — AMLODIPINE BESYLATE 2.5 MG PO TABS
ORAL_TABLET | Freq: Every day | ORAL | 5 refills | Status: DC
  Filled 2020-12-18: qty 30, 30d supply, fill #0
  Filled 2021-01-16: qty 30, 30d supply, fill #1
  Filled 2021-02-16: qty 30, 30d supply, fill #2
  Filled 2021-03-18: qty 30, 30d supply, fill #3
  Filled 2021-04-17: qty 30, 30d supply, fill #4
  Filled 2021-05-17: qty 30, 30d supply, fill #5

## 2020-12-19 ENCOUNTER — Telehealth: Payer: Self-pay | Admitting: Plastic Surgery

## 2020-12-19 ENCOUNTER — Other Ambulatory Visit: Payer: Self-pay

## 2020-12-19 MED ORDER — ESTRADIOL 1 MG PO TABS
ORAL_TABLET | Freq: Every day | ORAL | 2 refills | Status: DC
  Filled 2020-12-19: qty 30, 30d supply, fill #0
  Filled 2021-03-01: qty 30, 30d supply, fill #1
  Filled 2021-04-01: qty 30, 30d supply, fill #2

## 2020-12-19 NOTE — Telephone Encounter (Signed)
Returned patients call.  BL breast reduction was performed on 05/15/2020 with Dr. Marla Roe. The first week of November, she noted there appeared to be a small opening at the superior vertical incision of the left breast. She had been applying alcohol to the area and now has healed completely. Recently, she had her annual exam and the PC put in order for yearly MMG.  Due to the recent oozing of the left breast, PC would like for patient to be seen and cleared by our office. Will have front desk call patient and set up appointment.

## 2020-12-19 NOTE — Telephone Encounter (Signed)
Patient called yesterday, she had surgery in May and has had ongoing issues with oozing at the incision site which has stopped at the moment but her PCP has recommended she contact the operating surgeon for suggestions of next steps.

## 2020-12-26 ENCOUNTER — Other Ambulatory Visit: Payer: Self-pay

## 2020-12-26 ENCOUNTER — Encounter: Payer: Self-pay | Admitting: Internal Medicine

## 2020-12-26 ENCOUNTER — Ambulatory Visit (INDEPENDENT_AMBULATORY_CARE_PROVIDER_SITE_OTHER): Payer: No Typology Code available for payment source | Admitting: Internal Medicine

## 2020-12-26 VITALS — BP 138/72 | HR 67 | Temp 96.3°F | Ht 62.0 in | Wt 137.8 lb

## 2020-12-26 DIAGNOSIS — R61 Generalized hyperhidrosis: Secondary | ICD-10-CM | POA: Diagnosis not present

## 2020-12-26 DIAGNOSIS — N62 Hypertrophy of breast: Secondary | ICD-10-CM | POA: Diagnosis not present

## 2020-12-26 DIAGNOSIS — Z1231 Encounter for screening mammogram for malignant neoplasm of breast: Secondary | ICD-10-CM | POA: Insufficient documentation

## 2020-12-26 NOTE — Patient Instructions (Addendum)
Estrogen taper:   1/2 tablet daily for 2 weeks 1/2 tablet every other day for 2 weeks, then stop    Screening mammogram has been ordered.

## 2020-12-26 NOTE — Progress Notes (Signed)
Subjective:  Patient ID: Connie Francis, female    DOB: [birth year]  Age: 50 y.o. MRN: [id number]  CC: The primary encounter diagnosis was Encounter for screening mammogram for malignant neoplasm of breast. Diagnoses of Breast cancer screening by mammogram, Breast hypertrophy in female, and Night sweats were also pertinent to this visit.  HPI Connie Francis presents for  Chief Complaint  Patient presents with   Follow-up    Follow up on left breast drainage   This visit occurred during the SARS-CoV-2 public health emergency.  Safety protocols were in place, including screening questions prior to the visit, additional usage of staff PPE, and extensive cleaning of exam room while observing appropriate contact time as indicated for disinfecting solutions.   Follow up examination of left breast drainage reported during previous annual visit .  Patient is s/p bilateral breast reduction in May by Audelia Hives .  Surgery was uncomplicated.  However along the suture line on her left breast she had been having non purulent serous drainage,  scant amounts, without pain or redness.  A diagnostic mammogram/ultrasound was ordered but radiology refused to perform it and patient was advise to see her surgeon.  She returns today for a recheck on the breast.  The drainage had spontaneously resolved over one week ago and the breast is nontender.    Outpatient Medications Prior to Visit  Medication Sig Dispense Refill   ALPRAZolam (XANAX) 0.25 MG tablet Take 1 daily as needed 8 tablet 2   amLODipine (NORVASC) 2.5 MG tablet TAKE 1 TABLET (2.5 MG TOTAL) BY MOUTH DAILY. 30 tablet 5   amphetamine-dextroamphetamine (ADDERALL XR) 5 MG 24 hr capsule Take 1 capsule (5 mg total) by mouth daily with breakfast. 90 capsule 0   amphetamine-dextroamphetamine (ADDERALL XR) 5 MG 24 hr capsule Take 1 daily with breakfast 90 capsule 0   citalopram (CELEXA) 10 MG tablet Take 1 tablet (10 mg total) by mouth  daily with breakfast. 90 tablet 0   estradiol (ESTRACE) 1 MG tablet TAKE 1 TABLET BY MOUTH DAILY. 30 tablet 2   hyoscyamine (ANASPAZ) 0.125 MG TBDP disintergrating tablet Place 1 tablet (0.125 mg total) under the tongue every 6 (six) hours as needed. For abdominal /esophageal pain 30 tablet 0   Melatonin ([employer] MELATONIN) 5 MG SUBL Take 1 at bedtime as needed 90 tablet 0   ondansetron (ZOFRAN) 4 MG tablet Take 1 tablet (4 mg total) by mouth every 8 (eight) hours as needed for nausea or vomiting. 20 tablet 0   polyethylene glycol (MIRALAX / GLYCOLAX) packet Take 17 g by mouth daily as needed for mild constipation.     Probiotic Product (PROBIOTIC DAILY PO) Take 2 capsules by mouth daily with supper. Gummie     No facility-administered medications prior to visit.    Review of Systems;  Patient denies headache, fevers, malaise, unintentional weight loss, skin rash, eye pain, sinus congestion and sinus pain, sore throat, dysphagia,  hemoptysis , cough, dyspnea, wheezing, chest pain, palpitations, orthopnea, edema, abdominal pain, nausea, melena, diarrhea, constipation, flank pain, dysuria, hematuria, urinary  Frequency, nocturia, numbness, tingling, seizures,  Focal weakness, Loss of consciousness,  Tremor, insomnia, depression, anxiety, and suicidal ideation.      Objective:  BP 138/72 (BP Location: Left Arm, Patient Position: Sitting, Cuff Size: Normal)    Pulse 67    Temp (!) 96.3 F (35.7 C) (Temporal)    Ht 5\' 2"  (1.575 m)    Wt 137 lb 12.8 oz (62.5 kg)  SpO2 97%    BMI 25.20 kg/m   BP Readings from Last 3 Encounters:  12/26/20 138/72  11/20/20 118/78  05/15/20 129/76    Wt Readings from Last 3 Encounters:  12/26/20 137 lb 12.8 oz (62.5 kg)  11/20/20 135 lb 12.8 oz (61.6 kg)  05/15/20 139 lb (63 kg)   General appearance: alert, cooperative and appears stated age Head: Normocephalic, without obvious abnormality, atraumatic Eyes: conjunctivae/corneas clear. PERRL, EOM's intact.  Fundi benign. Ears: normal TM's and external ear canals both ears Nose: Nares normal. Septum midline. Mucosa normal. No drainage or sinus tenderness. Throat: lips, mucosa, and tongue normal; teeth and gums normal Neck: no adenopathy, no carotid bruit, no JVD, supple, symmetrical, trachea midline and thyroid not enlarged, symmetric, no tenderness/mass/nodules Lungs: clear to auscultation bilaterally Breasts: normal appearance, well healed surgical scars from recent breast reduction,  no masses or tenderness Heart: regular rate and rhythm, S1, S2 normal, no murmur, click, rub or gallop Abdomen: soft, non-tender; bowel sounds normal; no masses,  no organomegaly Extremities: extremities normal, atraumatic, no cyanosis or edema Pulses: 2+ and symmetric Skin: Skin color, texture, turgor normal. No rashes or lesions Neurologic: Alert and oriented X 3, normal strength and tone. Normal symmetric reflexes. Normal coordination and gait.    Lab Results  Component Value Date   HGBA1C 5.3 11/24/2019   HGBA1C 4.8 07/20/2015   HGBA1C 5.3 12/05/2014    Lab Results  Component Value Date   CREATININE 0.68 11/20/2020   CREATININE 0.71 05/15/2020   CREATININE 0.72 11/24/2019    Lab Results  Component Value Date   WBC 5.6 05/15/2020   HGB 12.5 05/15/2020   HCT 37.3 05/15/2020   PLT 233 05/15/2020   GLUCOSE 85 11/20/2020   CHOL 198 11/20/2020   TRIG 103.0 11/20/2020   HDL 69.00 11/20/2020   LDLCALC 109 (H) 11/20/2020   ALT 8 11/20/2020   AST 14 11/20/2020   NA 138 11/20/2020   K 3.8 11/20/2020   CL 101 11/20/2020   CREATININE 0.68 11/20/2020   BUN 12 11/20/2020   CO2 30 11/20/2020   TSH 0.70 11/20/2020   HGBA1C 5.3 11/24/2019   MICROALBUR <0.7 11/13/2018    No results found.  Assessment & Plan:   Problem List Items Addressed This Visit     Night sweats    Estrogen weaning advised over a two week period,  Taper written out       Breast hypertrophy in female    S/p breast  reduction surgery in May 2022 by Audelia Hives      Breast cancer screening by mammogram    Screening mammogram ordered       Other Visit Diagnoses     Encounter for screening mammogram for malignant neoplasm of breast    -  Primary   Relevant Orders   MM 3D SCREEN BREAST BILATERAL       I am having Connie Francis maintain her polyethylene glycol, hyoscyamine, ondansetron, Probiotic Product (PROBIOTIC DAILY PO), amphetamine-dextroamphetamine, amphetamine-dextroamphetamine, Melatonin, ALPRAZolam, citalopram, amLODipine, and estradiol.  No orders of the defined types were placed in this encounter.    I provided  20  minutes of  face-to-face time during this encounter reviewing patient's current problems and past surgeries, labs and imaging studies, providing counseling on the above mentioned problems , and coordination  of care .   Follow-up: No follow-ups on file.   Crecencio Mc, MD

## 2020-12-26 NOTE — Assessment & Plan Note (Signed)
S/p breast reduction surgery in May 2022 by Audelia Hives

## 2020-12-26 NOTE — Assessment & Plan Note (Signed)
Estrogen weaning advised over a two week period,  Taper written out

## 2020-12-26 NOTE — Assessment & Plan Note (Signed)
Screening mammogram ordered

## 2021-01-11 ENCOUNTER — Other Ambulatory Visit: Payer: Self-pay

## 2021-01-11 ENCOUNTER — Telehealth: Payer: Self-pay | Admitting: Internal Medicine

## 2021-01-11 ENCOUNTER — Other Ambulatory Visit (INDEPENDENT_AMBULATORY_CARE_PROVIDER_SITE_OTHER): Payer: No Typology Code available for payment source

## 2021-01-11 DIAGNOSIS — R300 Dysuria: Secondary | ICD-10-CM

## 2021-01-11 MED ORDER — CITALOPRAM HYDROBROMIDE 20 MG PO TABS
ORAL_TABLET | Freq: Every day | ORAL | 0 refills | Status: DC
  Filled 2021-01-11: qty 90, 90d supply, fill #0

## 2021-01-11 MED ORDER — ALPRAZOLAM 0.25 MG PO TABS
ORAL_TABLET | ORAL | 2 refills | Status: DC
  Filled 2021-01-11: qty 8, 30d supply, fill #0

## 2021-01-11 NOTE — Telephone Encounter (Signed)
Pt called in to see if she can do a urine sample today. Look in chart there is no orders for urinalysis.Advise Pt she couldn't do urine sample today because Dr. Derrel Nip had no orders in. Pt thinks she may have a UTI. Can orders be placed. Pt requesting callback

## 2021-01-11 NOTE — Telephone Encounter (Signed)
Spoke with pt and she stated that she thinks she has a UTI and wanted to know if she could drop off a urine sample today.

## 2021-01-11 NOTE — Telephone Encounter (Signed)
Sure.  Rene Paci ordered the labs . You can add her to tuesday\ HAW has home on Tuesday anyway, so.Marland KitchenMarland Kitchen

## 2021-01-11 NOTE — Telephone Encounter (Signed)
Pt has been scheduled for both lab and follow with Dr. Derrel Nip.

## 2021-01-12 ENCOUNTER — Other Ambulatory Visit: Payer: Self-pay

## 2021-01-12 ENCOUNTER — Telehealth: Payer: Self-pay

## 2021-01-12 ENCOUNTER — Other Ambulatory Visit: Payer: Self-pay | Admitting: Internal Medicine

## 2021-01-12 LAB — URINALYSIS, ROUTINE W REFLEX MICROSCOPIC
Bilirubin Urine: NEGATIVE
Ketones, ur: NEGATIVE
Nitrite: NEGATIVE
Specific Gravity, Urine: 1.015 (ref 1.000–1.030)
Total Protein, Urine: NEGATIVE
Urine Glucose: NEGATIVE
Urobilinogen, UA: 0.2 (ref 0.0–1.0)
pH: 6.5 (ref 5.0–8.0)

## 2021-01-12 MED ORDER — CIPROFLOXACIN HCL 250 MG PO TABS: 250.0000 mg | ORAL_TABLET | Freq: Two times a day (BID) | ORAL | 0 refills | Status: DC

## 2021-01-12 MED ORDER — CIPROFLOXACIN HCL 250 MG PO TABS
250.0000 mg | ORAL_TABLET | Freq: Two times a day (BID) | ORAL | 0 refills | Status: AC
  Filled 2021-01-12: qty 6, 3d supply, fill #0

## 2021-01-12 NOTE — Telephone Encounter (Signed)
Pt called requesting lab results

## 2021-01-12 NOTE — Addendum Note (Signed)
Addended by: Adair Laundry on: 01/12/2021 05:08 PM   Modules accepted: Orders

## 2021-01-12 NOTE — Telephone Encounter (Signed)
Spoke with pt and she wanted me to resend it to [facility] because she is there now picking up other medications. I have resent the rx. Whoever if it does have to be changed over the weekend it will need to go to [facility] on [address].

## 2021-01-13 LAB — URINE CULTURE
MICRO NUMBER:: 12808810
SPECIMEN QUALITY:: ADEQUATE

## 2021-01-16 ENCOUNTER — Encounter: Payer: Self-pay | Admitting: Internal Medicine

## 2021-01-16 ENCOUNTER — Other Ambulatory Visit: Payer: Self-pay

## 2021-01-16 ENCOUNTER — Telehealth (INDEPENDENT_AMBULATORY_CARE_PROVIDER_SITE_OTHER): Payer: No Typology Code available for payment source | Admitting: Internal Medicine

## 2021-01-16 DIAGNOSIS — B9689 Other specified bacterial agents as the cause of diseases classified elsewhere: Secondary | ICD-10-CM

## 2021-01-16 DIAGNOSIS — R399 Unspecified symptoms and signs involving the genitourinary system: Secondary | ICD-10-CM

## 2021-01-16 DIAGNOSIS — N390 Urinary tract infection, site not specified: Secondary | ICD-10-CM | POA: Diagnosis not present

## 2021-01-16 MED ORDER — CIPROFLOXACIN HCL 500 MG PO TABS
500.0000 mg | ORAL_TABLET | Freq: Two times a day (BID) | ORAL | 0 refills | Status: DC
  Filled 2021-01-16: qty 6, 3d supply, fill #0

## 2021-01-16 MED ORDER — AMPHETAMINE-DEXTROAMPHET ER 5 MG PO CP24
ORAL_CAPSULE | ORAL | 0 refills | Status: DC
  Filled 2021-01-16: qty 90, 90d supply, fill #0

## 2021-01-16 NOTE — Progress Notes (Signed)
Virtual Visit via [place] Note  This visit type was conducted due to national recommendations for restrictions regarding the COVID-19 pandemic (e.g. social distancing).  This format is felt to be most appropriate for this patient at this time.  All issues noted in this document were discussed and addressed.  No physical exam was performed (except for noted visual exam findings with Video Visits).   I connected withNAME@ on 01/16/21 at  1:30 PM EST by a video enabled telemedicine application or telephone and verified that I am speaking with the correct person using two identifiers. Location patient: home Location provider: work or home office Persons participating in the virtual visit: patient, provider  I discussed the limitations, risks, security and privacy concerns of performing an evaluation and management service by telephone and the availability of in person appointments. I also discussed with the patient that there may be a patient responsible charge related to this service. The patient expressed understanding and agreed to proceed.  Reason for visit: FOLLOW UP ON UTI SYMPTOMS WITH POSITIVE UA AND CULTURE  T HPI:   51 yr old female with h/o TAH presents for follow up on UTI symptoms. Symptoms of urinary burning and pressure started about on or around Dec 27 .  She increased her water intake and submitted urine for UA and culture which  were conclusive for Klebsiella oxytoca and she was prescribed cipro 250 mg bid starting  Dec 30  the  The burning has subsided but pt stated that she is still having some pressure with urination and incomplete emptying ("I feel like  my bladder is falling out.""  She has not examined herself to look for a bulge at the introitus . Denies back pain, fevers, hematuria and nausea.    ROS: See pertinent positives and negatives per HPI.  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   Psychogenic dyspepsia 11/27/2012   Thyroid disease    hyper    Past Surgical History:  Procedure Laterality Date   BREAST CYST ASPIRATION Left    negative 2010   BREAST REDUCTION SURGERY Bilateral 05/15/2020   Procedure: BREAST REDUCTION WITH LIPOSUCTION;  Surgeon: Wallace Going, DO;  Location: [facility];  Service: Plastics;  Laterality: Bilateral;   CESAREAN SECTION     COLONOSCOPY WITH PROPOFOL N/A 07/27/2014   Procedure: COLONOSCOPY WITH PROPOFOL;  Surgeon: Lucilla Lame, MD;  Location: [facility];  Service: Endoscopy;  Laterality: N/A;   colonscopy  07/27/2015   DILATION AND CURETTAGE OF UTERUS  5573   complicated by heart failure   ESOPHAGOGASTRODUODENOSCOPY (EGD) WITH PROPOFOL N/A 07/27/2014   Procedure: ESOPHAGOGASTRODUODENOSCOPY (EGD) WITH PROPOFOL;  Surgeon: Lucilla Lame, MD;  Location: [facility];  Service: Endoscopy;  Laterality: N/A;   TUBAL LIGATION     UPPER ENDOSCOPY W/ ESOPHAGEAL MANOMETRY     VAGINAL HYSTERECTOMY      Family History  Problem Relation Age of Onset   Cancer Maternal Aunt  parotid Ca   Hearing loss Maternal Grandmother    Alcohol abuse Maternal Grandmother    Hypertension Mother    Stroke Paternal Aunt    Cancer Paternal Uncle        lung, nasopharnygeal   Arthritis Maternal Grandfather    Esophageal cancer Cousin 20   Colon cancer Neg Hx    Liver disease Neg Hx    Breast cancer Neg Hx    Ovarian cancer Neg Hx    Diabetes Neg Hx     SOCIAL HX:  reports that she has never smoked. She has never used smokeless tobacco. She reports that she does not  currently use alcohol after a past usage of about 1.0 standard drink per week. She reports that she does not use drugs.    Current Outpatient Medications:    ALPRAZolam (XANAX) 0.25 MG tablet, Take 1 daily as needed, Disp: 8 tablet, Rfl: 2   amLODipine (NORVASC) 2.5 MG tablet, TAKE 1 TABLET (2.5 MG TOTAL) BY MOUTH DAILY., Disp: 30 tablet, Rfl: 5   amphetamine-dextroamphetamine (ADDERALL XR) 5 MG 24 hr capsule, Take 1 capsule (5 mg total) by mouth daily with breakfast., Disp: 90 capsule, Rfl: 0   amphetamine-dextroamphetamine (ADDERALL XR) 5 MG 24 hr capsule, Take 1 daily with breakfast, Disp: 90 capsule, Rfl: 0   amphetamine-dextroamphetamine (ADDERALL XR) 5 MG 24 hr capsule, Take 1 dailywith breakfast, Disp: 90 capsule, Rfl: 0   citalopram (CELEXA) 20 MG tablet, Take 1 daily, Disp: 90 tablet, Rfl: 0   estradiol (ESTRACE) 1 MG tablet, TAKE 1 TABLET BY MOUTH DAILY., Disp: 30 tablet, Rfl: 2   hyoscyamine (ANASPAZ) 0.125 MG TBDP disintergrating tablet, Place 1 tablet (0.125 mg total) under the tongue every 6 (six) hours as needed. For abdominal /esophageal pain, Disp: 30 tablet, Rfl: 0   Melatonin ([employer] MELATONIN) 5 MG SUBL, Take 1 at bedtime as needed, Disp: 90 tablet, Rfl: 0   ondansetron (ZOFRAN) 4 MG tablet, Take 1 tablet (4 mg total) by mouth every 8 (eight) hours as needed for nausea or vomiting., Disp: 20 tablet, Rfl: 0   polyethylene glycol (MIRALAX / GLYCOLAX) packet, Take 17 g by mouth daily as needed for mild constipation., Disp: , Rfl:    Probiotic Product (PROBIOTIC DAILY PO), Take 2 capsules by mouth daily with supper. Gummie, Disp: , Rfl:    ciprofloxacin (CIPRO) 500 MG tablet, Take 1 tablet (500 mg total) by mouth 2 (two) times daily., Disp: 6 tablet, Rfl: 0  EXAM:  VITALS per patient if applicable:  GENERAL: alert, oriented, appears well and in no acute distress  HEENT: atraumatic, conjunttiva clear, no obvious abnormalities on inspection of external nose and ears  NECK: normal  movements of the head and neck  LUNGS: on inspection no signs of respiratory distress, breathing rate appears normal, no obvious gross SOB, gasping or wheezing  CV: no obvious cyanosis  MS: moves all visible extremities without noticeable abnormality  PSYCH/NEURO: pleasant and cooperative, no obvious depression or anxiety, speech and thought processing grossly intact  ASSESSMENT AND PLAN:  Discussed the following assessment and plan:  UTI symptoms - Plan: ciprofloxacin (CIPRO) 500 MG tablet  UTI due to Klebsiella species  UTI due to Klebsiella species Symptoms have not completely resolved after 3 days of cipro 250 mg bid,  Will add 3 more days of cipro 500 mg bid.  If symptoms persist will need pelvic exam to rule out cystocele.     I discussed the assessment and treatment plan  with the patient. The patient was provided an opportunity to ask questions and all were answered. The patient agreed with the plan and demonstrated an understanding of the instructions.   The patient was advised to call back or seek an in-person evaluation if the symptoms worsen or if the condition fails to improve as anticipated.   I spent 30 minutes dedicated to the care of this patient on the date of this encounter to include pre-visit review of his medical history,  Face-to-face time with the patient , and post visit ordering of testing and therapeutics.    Crecencio Mc, MD

## 2021-01-16 NOTE — Assessment & Plan Note (Signed)
Symptoms have not completely resolved after 3 days of cipro 250 mg bid,  Will add 3 more days of cipro 500 mg bid.  If symptoms persist will need pelvic exam to rule out cystocele.

## 2021-01-17 ENCOUNTER — Other Ambulatory Visit: Payer: Self-pay

## 2021-01-24 IMAGING — CT CT ABD-PELV W/ CM
2 of 5 series · 16 of 46 positions shown, 18 images · IV contrast (APPLIED)
Comparison: 06/25/2018

CLINICAL DATA: Sudden onset of right-sided abdominal pain

EXAM:
CT ABDOMEN AND PELVIS WITH CONTRAST
TECHNIQUE: Multidetector CT imaging of the abdomen and pelvis was performed
using the standard protocol following bolus administration of
intravenous contrast.
CONTRAST:  100mL OMNIPAQUE IOHEXOL 300 MG/ML  SOLN

[Series 2: routine abd/pel with · axial · 0.65mm/px · z∈[-827,-457]mm · 13 of 84 slices shown, 15 images]
[im 5/84  soft-tissue]
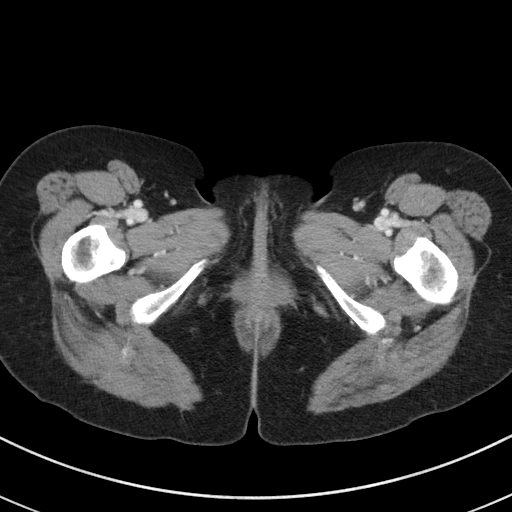
[im 5/84  bone]
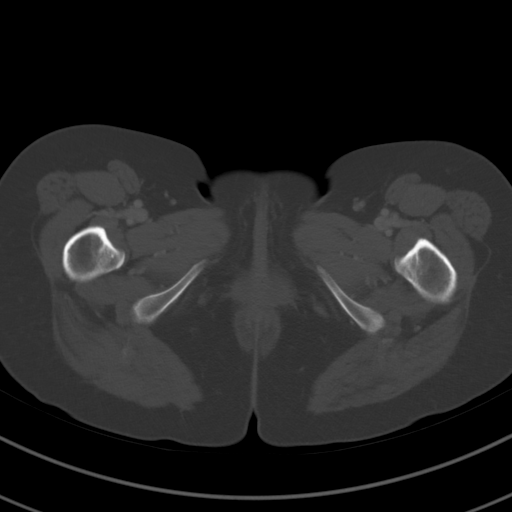
[im 14/84  soft-tissue]
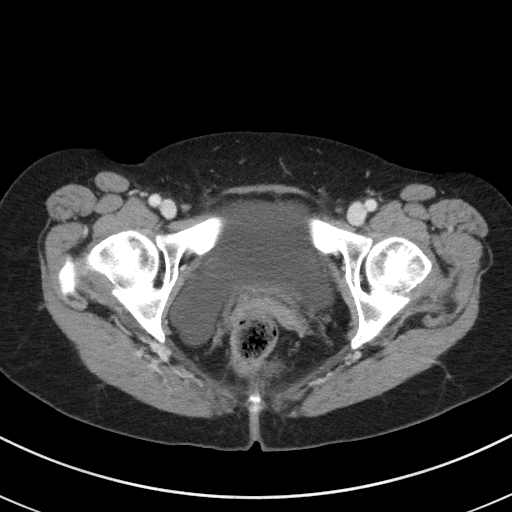
[im 18/84  soft-tissue]
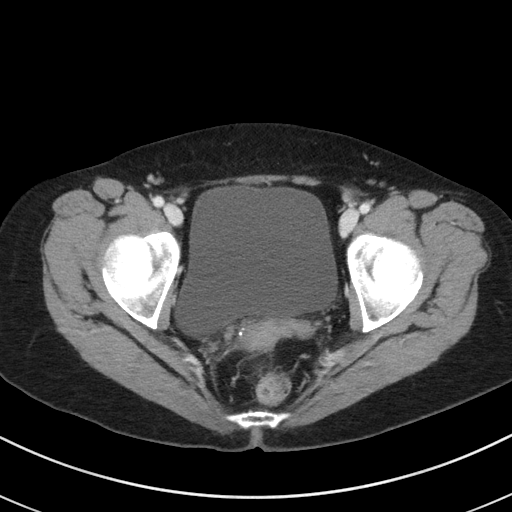
[im 22/84  soft-tissue]
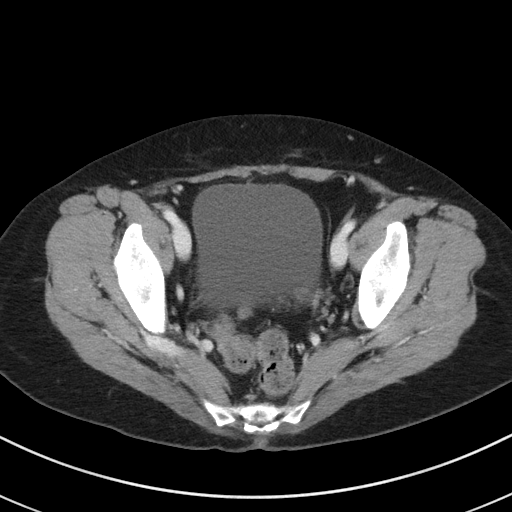
[im 31/84  soft-tissue]
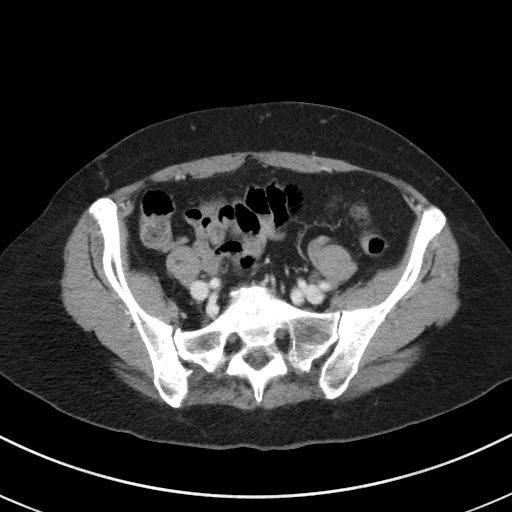
[im 35/84  soft-tissue]
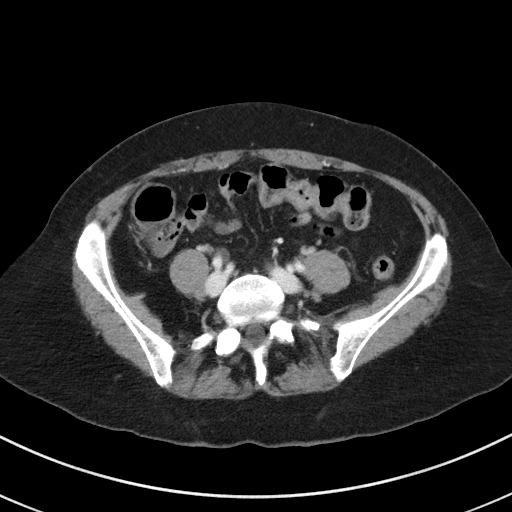
[im 44/84  soft-tissue]
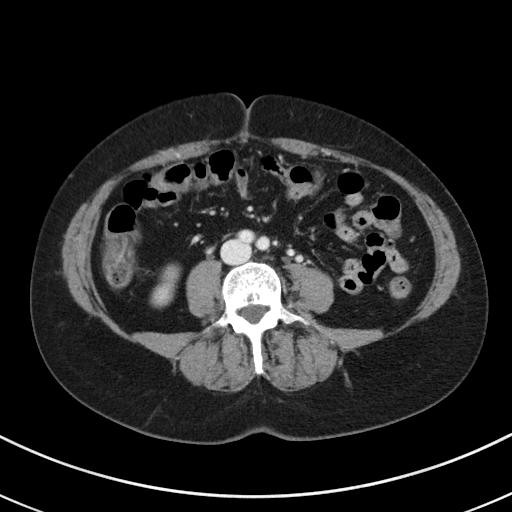
[im 49/84  soft-tissue]
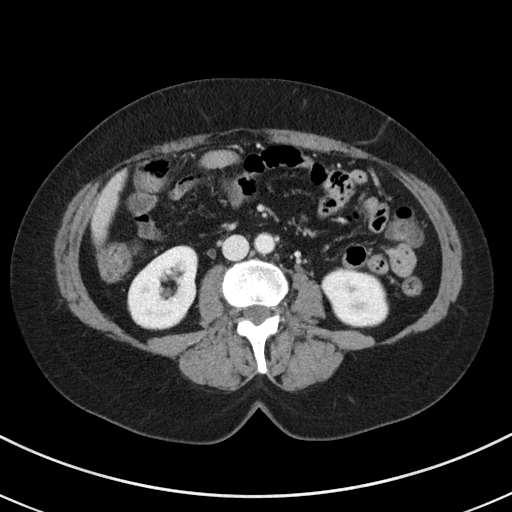
[im 53/84  soft-tissue]
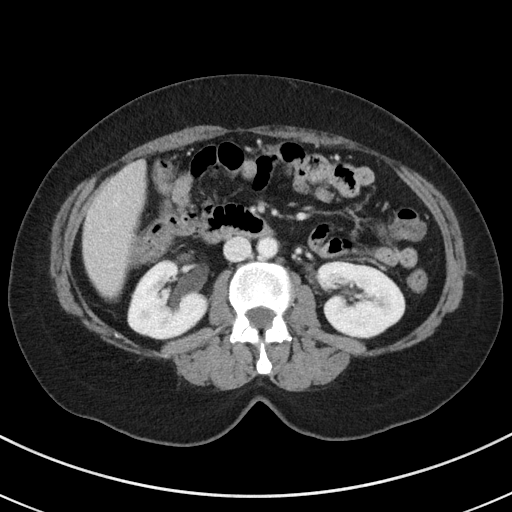
[im 53/84  bone]
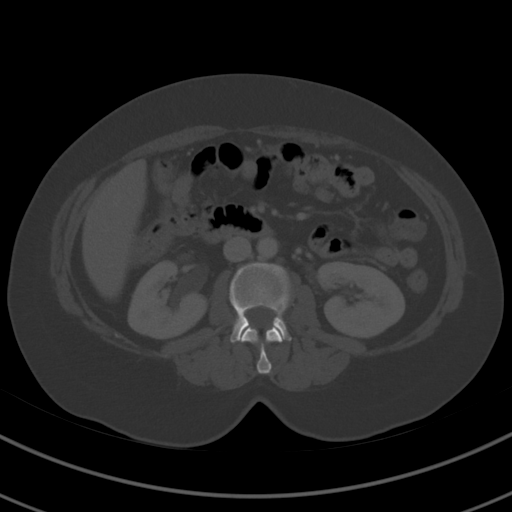
[im 62/84  soft-tissue]
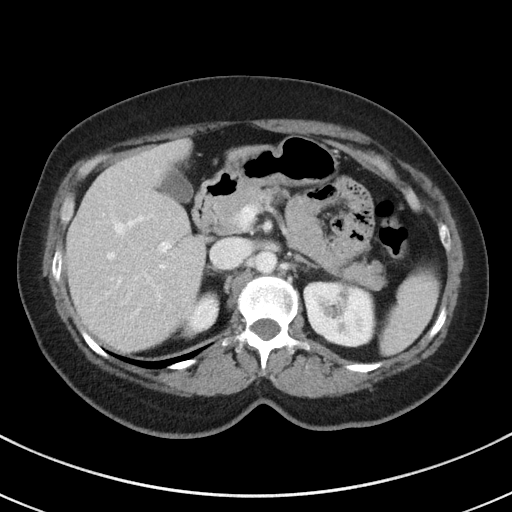
[im 66/84  soft-tissue]
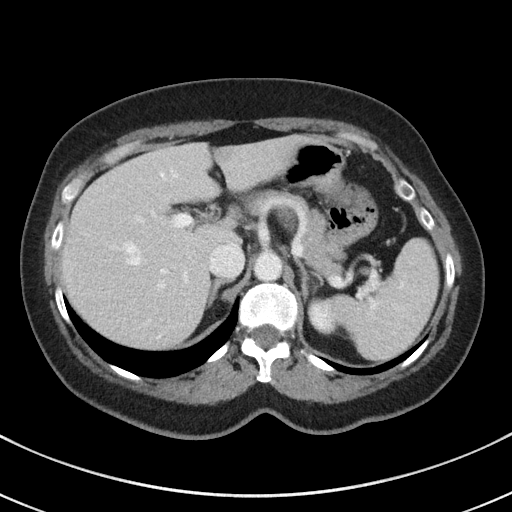
[im 70/84  soft-tissue]
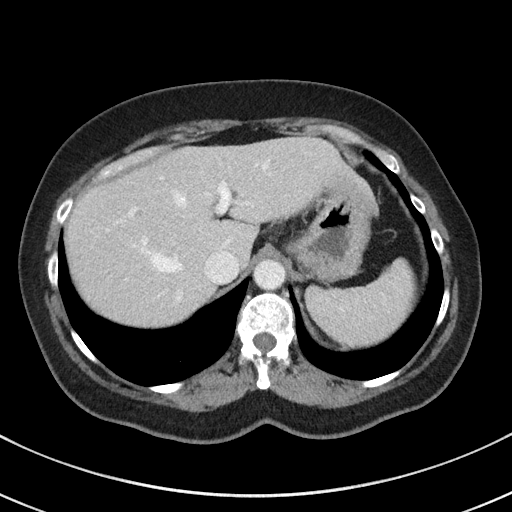
[im 79/84  soft-tissue]
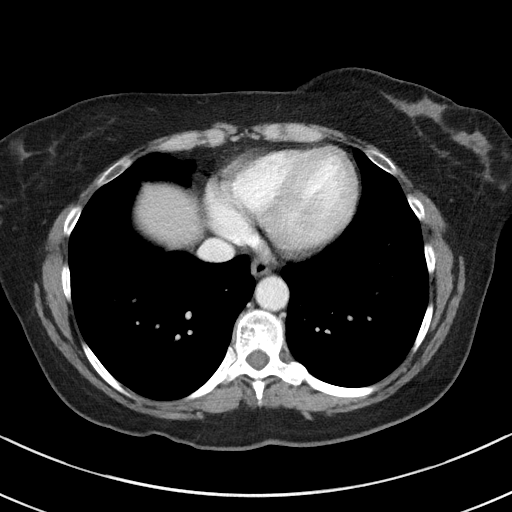

[Series 5: coronal st · coronal · 0.59mm/px · 3 of 70 slices shown]
[im 24/70  soft-tissue]
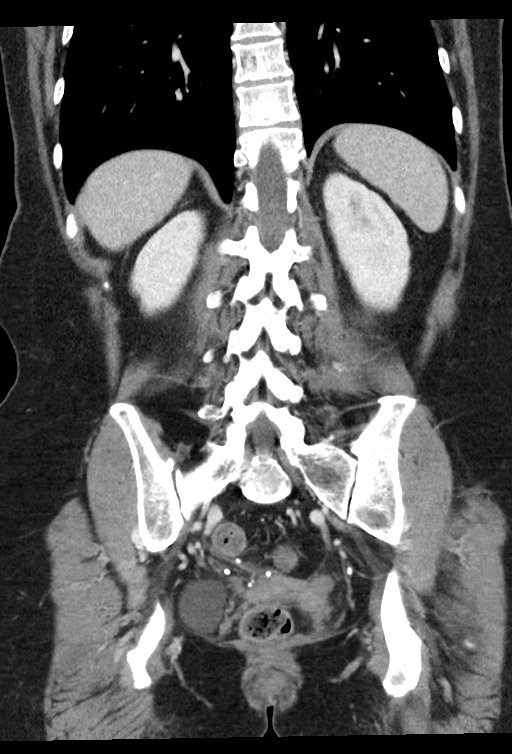
[im 31/70  soft-tissue]
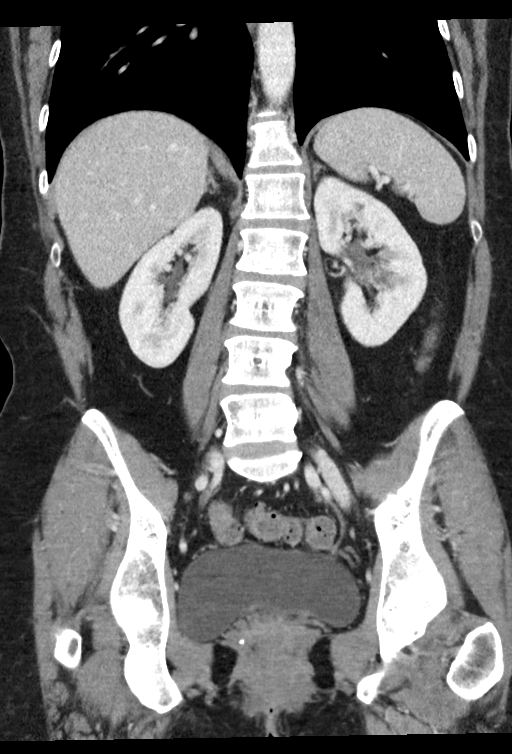
[im 39/70  soft-tissue]
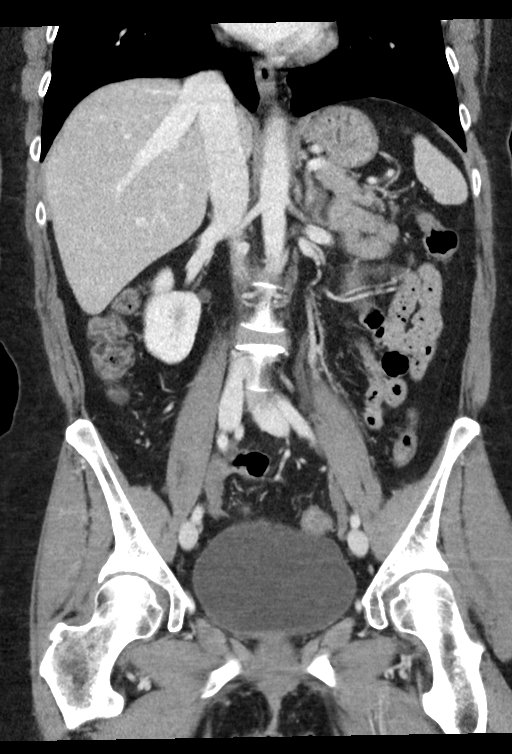

[16 of 46 positions shown; findings below may reference images not displayed]

FINDINGS: Lower chest:  No contributory findings.

Hepatobiliary: No focal liver abnormality.No evidence of biliary
obstruction or stone.

Pancreas: Unremarkable.

Spleen: Unremarkable.

Adrenals/Urinary Tract: Negative adrenals. No hydronephrosis or
stone. Small focus of scarring at the upper pole left kidney.
Unremarkable bladder.

Stomach/Bowel:  No obstruction. No appendicitis.

Vascular/Lymphatic: No acute vascular abnormality. No mass or
adenopathy.

Reproductive:Hysterectomy.

Other: No ascites or pneumoperitoneum.

Musculoskeletal: No acute abnormalities. Mild dextrocurvature of the
lumbar spine.
IMPRESSION: No acute finding or explanation for abdominal pain.

## 2021-02-06 ENCOUNTER — Ambulatory Visit (INDEPENDENT_AMBULATORY_CARE_PROVIDER_SITE_OTHER): Payer: No Typology Code available for payment source

## 2021-02-06 ENCOUNTER — Other Ambulatory Visit: Payer: Self-pay

## 2021-02-06 ENCOUNTER — Ambulatory Visit (INDEPENDENT_AMBULATORY_CARE_PROVIDER_SITE_OTHER): Payer: No Typology Code available for payment source | Admitting: Family

## 2021-02-06 ENCOUNTER — Encounter: Payer: Self-pay | Admitting: Family

## 2021-02-06 VITALS — BP 120/80 | HR 65 | Temp 98.4°F | Ht 62.0 in | Wt 138.2 lb

## 2021-02-06 DIAGNOSIS — S99921A Unspecified injury of right foot, initial encounter: Secondary | ICD-10-CM

## 2021-02-06 DIAGNOSIS — M79674 Pain in right toe(s): Secondary | ICD-10-CM | POA: Diagnosis not present

## 2021-02-06 NOTE — Progress Notes (Signed)
Acute 

## 2021-02-06 NOTE — Progress Notes (Signed)
Acute Office Visit  Subjective:    Patient ID: Connie Francis, female    DOB: 1970/10/07, 51 y.o.   MRN: [id number]  Chief Complaint  Patient presents with   Acute Visit    Rt foot middle toes, pain and swollen X 1 week    HPI Patient is in today with c/o pain, swelling, right middle toe x 1 week after hitting her toe. She has pain, swelling and pain 4/10, worse with walking and wearing certain shoes. Has an occasional sharp pain through the toe.   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   Psychogenic dyspepsia 11/27/2012   Thyroid disease    hyper    Past Surgical History:  Procedure Laterality Date   BREAST CYST ASPIRATION Left    negative 2010   BREAST REDUCTION SURGERY Bilateral 05/15/2020   Procedure: BREAST REDUCTION WITH LIPOSUCTION;  Surgeon: Wallace Going, DO;  Location: [facility];  Service: Plastics;  Laterality: Bilateral;   CESAREAN SECTION     COLONOSCOPY WITH PROPOFOL N/A 07/27/2014   Procedure: COLONOSCOPY WITH PROPOFOL;  Surgeon: Lucilla Lame, MD;  Location: [facility];  Service: Endoscopy;  Laterality: N/A;   colonscopy  07/27/2015   DILATION AND CURETTAGE OF UTERUS  6295   complicated by heart failure   ESOPHAGOGASTRODUODENOSCOPY (EGD) WITH PROPOFOL N/A 07/27/2014   Procedure: ESOPHAGOGASTRODUODENOSCOPY (EGD) WITH PROPOFOL;  Surgeon: Lucilla Lame, MD;  Location: [facility];  Service: Endoscopy;  Laterality: N/A;   TUBAL LIGATION     UPPER ENDOSCOPY W/ ESOPHAGEAL MANOMETRY     VAGINAL  HYSTERECTOMY      Family History  Problem Relation Age of Onset   Cancer Maternal Aunt        parotid Ca   Hearing loss Maternal Grandmother    Alcohol abuse Maternal Grandmother    Hypertension Mother    Stroke Paternal Aunt    Cancer Paternal Uncle        lung, nasopharnygeal   Arthritis Maternal Grandfather    Esophageal cancer Cousin 52   Colon cancer Neg Hx    Liver disease Neg Hx    Breast cancer Neg Hx    Ovarian cancer Neg Hx    Diabetes Neg Hx     Social History   Socioeconomic History   Marital status: Married    Spouse name: Not on file   Number of children: Not on file   Years of education: Not on file   Highest education level: Not on file  Occupational History   Not on file  Tobacco Use   Smoking status: Never   Smokeless tobacco: Never  Vaping Use   Vaping Use: Never used  Substance and Sexual Activity   Alcohol use: Not Currently    Alcohol/week: 1.0 standard drink    Types: 1 Glasses of wine per week    Comment: Rare   Drug use: Never   Sexual activity: Yes    Birth  control/protection: Surgical  Other Topics Concern   Not on file  Social History Narrative   Not on file   Social Determinants of Health   Financial Resource Strain: Not on file  Food Insecurity: Not on file  Transportation Needs: Not on file  Physical Activity: Not on file  Stress: Not on file  Social Connections: Not on file  Intimate Partner Violence: Not on file    Outpatient Medications Prior to Visit  Medication Sig Dispense Refill   ALPRAZolam (XANAX) 0.25 MG tablet Take 1 daily as needed 8 tablet 2   amLODipine (NORVASC) 2.5 MG tablet TAKE 1 TABLET (2.5 MG TOTAL) BY MOUTH DAILY. 30 tablet 5   amphetamine-dextroamphetamine (ADDERALL XR) 5 MG 24 hr capsule Take 1 capsule (5 mg total) by mouth daily with breakfast. 90 capsule 0   amphetamine-dextroamphetamine (ADDERALL XR) 5 MG 24 hr capsule Take 1 daily with breakfast 90 capsule 0   amphetamine-dextroamphetamine  (ADDERALL XR) 5 MG 24 hr capsule Take 1 dailywith breakfast 90 capsule 0   ciprofloxacin (CIPRO) 500 MG tablet Take 1 tablet (500 mg total) by mouth 2 (two) times daily. 6 tablet 0   citalopram (CELEXA) 20 MG tablet Take 1 daily 90 tablet 0   estradiol (ESTRACE) 1 MG tablet TAKE 1 TABLET BY MOUTH DAILY. 30 tablet 2   hyoscyamine (ANASPAZ) 0.125 MG TBDP disintergrating tablet Place 1 tablet (0.125 mg total) under the tongue every 6 (six) hours as needed. For abdominal /esophageal pain 30 tablet 0   Melatonin ([employer] MELATONIN) 5 MG SUBL Take 1 at bedtime as needed 90 tablet 0   ondansetron (ZOFRAN) 4 MG tablet Take 1 tablet (4 mg total) by mouth every 8 (eight) hours as needed for nausea or vomiting. 20 tablet 0   polyethylene glycol (MIRALAX / GLYCOLAX) packet Take 17 g by mouth daily as needed for mild constipation.     Probiotic Product (PROBIOTIC DAILY PO) Take 2 capsules by mouth daily with supper. Gummie     No facility-administered medications prior to visit.    Allergies  Allergen Reactions   Latex     Swelling and itching    Lisinopril Cough    cough   Penicillins Swelling    Reaction: 1996   Promethazine Other (See Comments)    Drowsy   Shellfish Allergy     hives   Sulfa Antibiotics     Rash and itching    Lamictal [Lamotrigine] Rash    Blisters     Review of Systems  Constitutional: Negative.   Respiratory: Negative.    Cardiovascular: Negative.   Musculoskeletal:  Positive for arthralgias.       Right middles toe, swollen, painful with movement   Skin: Negative.   Psychiatric/Behavioral: Negative.    All other systems reviewed and are negative.     Objective:    Physical Exam Vitals reviewed.  Constitutional:      Appearance: Normal appearance.  Cardiovascular:     Rate and Rhythm: Normal rate and regular rhythm.  Pulmonary:     Effort: Pulmonary effort is normal.     Breath sounds: Normal breath sounds.  Musculoskeletal:     Cervical back: Normal  range of motion and neck supple.     Comments: Middle toe on the right foot: mild swelling. Tender medially. Decreased rom due to pain. Skin in-tact  Skin:    General: Skin is warm and dry.  Neurological:     General: No focal deficit present.  Mental Status: She is alert and oriented to person, place, and time.  Psychiatric:        Mood and Affect: Mood normal.        Behavior: Behavior normal.    BP 120/80 (BP Location: Left Arm, Patient Position: Sitting, Cuff Size: Normal)    Pulse 65    Temp 98.4 F (36.9 C) (Oral)    Ht 5\' 2"  (1.575 m)    Wt 138 lb 3.2 oz (62.7 kg)    SpO2 99%    BMI 25.28 kg/m  Wt Readings from Last 3 Encounters:  02/06/21 138 lb 3.2 oz (62.7 kg)  01/16/21 137 lb (62.1 kg)  12/26/20 137 lb 12.8 oz (62.5 kg)    Health Maintenance Due  Topic Date Due   HIV Screening  Never done   COVID-19 Vaccine (4 - Booster for [place] series) 02/01/2020   TETANUS/TDAP  07/06/2020    There are no preventive care reminders to display for this patient.   Lab Results  Component Value Date   TSH 0.70 11/20/2020   Lab Results  Component Value Date   WBC 5.6 05/15/2020   HGB 12.5 05/15/2020   HCT 37.3 05/15/2020   MCV 91.6 05/15/2020   PLT 233 05/15/2020   Lab Results  Component Value Date   NA 138 11/20/2020   K 3.8 11/20/2020   CO2 30 11/20/2020   GLUCOSE 85 11/20/2020   BUN 12 11/20/2020   CREATININE 0.68 11/20/2020   BILITOT 0.4 11/20/2020   ALKPHOS 75 11/20/2020   AST 14 11/20/2020   ALT 8 11/20/2020   PROT 6.9 11/20/2020   ALBUMIN 4.1 11/20/2020   CALCIUM 9.0 11/20/2020   ANIONGAP 6 05/15/2020   GFR 103.09 11/20/2020   Lab Results  Component Value Date   CHOL 198 11/20/2020   Lab Results  Component Value Date   HDL 69.00 11/20/2020   Lab Results  Component Value Date   LDLCALC 109 (H) 11/20/2020   Lab Results  Component Value Date   TRIG 103.0 11/20/2020   Lab Results  Component Value Date   CHOLHDL 3 11/20/2020   Lab Results   Component Value Date   HGBA1C 5.3 11/24/2019       Assessment & Plan:   Problem List Items Addressed This Visit   None Visit Diagnoses     Injury of right toe, initial encounter    -  Primary   Relevant Orders   DG Toe 3rd Right   Pain in right toe(s)       Relevant Orders   DG Toe 3rd Right       NSAIDS as needed, buddy tape for support. Xray obtained today with notify patient pending results. Recheck as scheduled and sooner as needed    Kennyth Arnold, FNP

## 2021-02-09 ENCOUNTER — Other Ambulatory Visit: Payer: Self-pay

## 2021-02-09 MED ORDER — ALPRAZOLAM 0.25 MG PO TABS
ORAL_TABLET | ORAL | 2 refills | Status: DC
  Filled 2021-02-09: qty 8, 30d supply, fill #0
  Filled 2021-04-17: qty 8, 30d supply, fill #1

## 2021-02-09 MED ORDER — AMPHETAMINE-DEXTROAMPHET ER 5 MG PO CP24: ORAL_CAPSULE | Freq: Every day | ORAL | 0 refills | Status: DC

## 2021-02-09 MED ORDER — CITALOPRAM HYDROBROMIDE 10 MG PO TABS
ORAL_TABLET | ORAL | 0 refills | Status: DC
  Filled 2021-02-09: qty 135, 90d supply, fill #0

## 2021-02-16 ENCOUNTER — Other Ambulatory Visit: Payer: Self-pay

## 2021-02-27 ENCOUNTER — Other Ambulatory Visit: Payer: Self-pay

## 2021-02-27 ENCOUNTER — Ambulatory Visit
Admission: RE | Admit: 2021-02-27 | Discharge: 2021-02-27 | Disposition: A | Discharge status: Home / Self Care | Payer: No Typology Code available for payment source | Source: Ambulatory Visit | Attending: Internal Medicine | Admitting: Internal Medicine

## 2021-02-27 DIAGNOSIS — Z1231 Encounter for screening mammogram for malignant neoplasm of breast: Secondary | ICD-10-CM | POA: Insufficient documentation

## 2021-03-01 ENCOUNTER — Other Ambulatory Visit: Payer: Self-pay

## 2021-03-19 ENCOUNTER — Other Ambulatory Visit: Payer: Self-pay

## 2021-04-02 ENCOUNTER — Other Ambulatory Visit: Payer: Self-pay

## 2021-04-17 ENCOUNTER — Other Ambulatory Visit: Payer: Self-pay

## 2021-04-18 ENCOUNTER — Encounter: Payer: Self-pay | Admitting: Internal Medicine

## 2021-04-18 ENCOUNTER — Ambulatory Visit (INDEPENDENT_AMBULATORY_CARE_PROVIDER_SITE_OTHER): Payer: No Typology Code available for payment source | Admitting: Internal Medicine

## 2021-04-18 ENCOUNTER — Other Ambulatory Visit: Payer: Self-pay

## 2021-04-18 DIAGNOSIS — J0100 Acute maxillary sinusitis, unspecified: Secondary | ICD-10-CM | POA: Insufficient documentation

## 2021-04-18 MED ORDER — PREDNISONE 10 MG PO TABS
ORAL_TABLET | ORAL | 0 refills | Status: DC
  Filled 2021-04-18: qty 21, 6d supply, fill #0

## 2021-04-18 MED ORDER — LEVOFLOXACIN 500 MG PO TABS
500.0000 mg | ORAL_TABLET | Freq: Every day | ORAL | 0 refills | Status: DC
  Filled 2021-04-18: qty 7, 7d supply, fill #0

## 2021-04-18 NOTE — Progress Notes (Signed)
? ?Subjective:  ?Patient ID: Connie Francis, female    DOB: 1970/12/23  Age: 51 y.o. MRN: [id number] ? ?CC: The encounter diagnosis was Acute non-recurrent maxillary sinusitis. ? ? ?This visit occurred during the SARS-CoV-2 public health emergency.  Safety protocols were in place, including screening questions prior to the visit, additional usage of staff PPE, and extensive cleaning of exam room while observing appropriate contact time as indicated for disinfecting solutions.   ? ?HPI ?Connie Francis presents for  ?Chief Complaint  ?Patient presents with  ? Cough  ?  Congestion and sinus drainage  ? ? ?51 yr old female with hypertension allergic rhinitis presents with sinus congestion and drainage for the past 6 days.  Has been using cliritn,  flonase  wth no change.  Now her upper Teeth hurting and right ear is feeling pressure and popping.   Covid negative home test x 2. .     ? ?Outpatient Medications Prior to Visit  ?Medication Sig Dispense Refill  ? ALPRAZolam (XANAX) 0.25 MG tablet Take 1 daily as needed 8 tablet 2  ? ALPRAZolam (XANAX) 0.25 MG tablet Take 1 daily as needed 8 tablet 2  ? amLODipine (NORVASC) 2.5 MG tablet TAKE 1 TABLET (2.5 MG TOTAL) BY MOUTH DAILY. 30 tablet 5  ? amphetamine-dextroamphetamine (ADDERALL XR) 5 MG 24 hr capsule Take 1 daily with breakfast 90 capsule 0  ? amphetamine-dextroamphetamine (ADDERALL XR) 5 MG 24 hr capsule Take 1 daily 90 capsule 0  ? ciprofloxacin (CIPRO) 500 MG tablet Take 1 tablet (500 mg total) by mouth 2 (two) times daily. 6 tablet 0  ? citalopram (CELEXA) 10 MG tablet Take 1 and 1/2 tablet po daily 135 tablet 0  ? estradiol (ESTRACE) 1 MG tablet TAKE 1 TABLET BY MOUTH DAILY. 30 tablet 2  ? hyoscyamine (ANASPAZ) 0.125 MG TBDP disintergrating tablet Place 1 tablet (0.125 mg total) under the tongue every 6 (six) hours as needed. For abdominal /esophageal pain 30 tablet 0  ? Melatonin ([employer] MELATONIN) 5 MG SUBL Take 1 at bedtime as needed 90 tablet 0  ?  ondansetron (ZOFRAN) 4 MG tablet Take 1 tablet (4 mg total) by mouth every 8 (eight) hours as needed for nausea or vomiting. 20 tablet 0  ? polyethylene glycol (MIRALAX / GLYCOLAX) packet Take 17 g by mouth daily as needed for mild constipation.    ? Probiotic Product (PROBIOTIC DAILY PO) Take 2 capsules by mouth daily with supper. Gummie    ? amphetamine-dextroamphetamine (ADDERALL XR) 5 MG 24 hr capsule Take 1 capsule (5 mg total) by mouth daily with breakfast. 90 capsule 0  ? citalopram (CELEXA) 20 MG tablet Take 1 daily 90 tablet 0  ? ?No facility-administered medications prior to visit.  ? ? ?Review of Systems; ? ?Patient denies headache, fevers, malaise, unintentional weight loss, skin rash, eye pain, sinus congestion and sinus pain, sore throat, dysphagia,  hemoptysis , cough, dyspnea, wheezing, chest pain, palpitations, orthopnea, edema, abdominal pain, nausea, melena, diarrhea, constipation, flank pain, dysuria, hematuria, urinary  Frequency, nocturia, numbness, tingling, seizures,  Focal weakness, Loss of consciousness,  Tremor, insomnia, depression, anxiety, and suicidal ideation.   ? ? ? ?Objective:  ?BP 128/82 (BP Location: Left Arm, Patient Position: Sitting, Cuff Size: Normal)   Pulse 84   Temp 98.1 ?F (36.7 ?C) (Oral)   Ht '5\' 2"'$  (1.575 m)   Wt 136 lb 6.4 oz (61.9 kg)   SpO2 98%   BMI 24.95 kg/m?  ? ?BP Readings  from Last 3 Encounters:  ?04/18/21 128/82  ?02/06/21 120/80  ?12/26/20 138/72  ? ? ?Wt Readings from Last 3 Encounters:  ?04/18/21 136 lb 6.4 oz (61.9 kg)  ?02/06/21 138 lb 3.2 oz (62.7 kg)  ?01/16/21 137 lb (62.1 kg)  ? ? ?General appearance: alert, cooperative and appears stated age ?Face: tender over right maxillary sinus.  ?Ears: slightly erythematous  TM's and external ear canals both ears ?Throat: lips, mucosa, and tongue normal; teeth and gums normal ?Neck: no adenopathy, no carotid bruit, supple, symmetrical, trachea midline and thyroid not enlarged, symmetric, no  tenderness/mass/nodules ?Back: symmetric, no curvature. ROM normal. No CVA tenderness. ?Lungs: clear to auscultation bilaterally ?Heart: regular rate and rhythm, S1, S2 normal, no murmur, click, rub or gallop ?Abdomen: soft, non-tender; bowel sounds normal; no masses,  no organomegaly ?Pulses: 2+ and symmetric ?Skin: Skin color, texture, turgor normal. No rashes or lesions ?Lymph nodes: Cervical, supraclavicular, and axillary nodes normal. ? ?Lab Results  ?Component Value Date  ? HGBA1C 5.3 11/24/2019  ? HGBA1C 4.8 07/20/2015  ? HGBA1C 5.3 12/05/2014  ? ? ?Lab Results  ?Component Value Date  ? CREATININE 0.68 11/20/2020  ? CREATININE 0.71 05/15/2020  ? CREATININE 0.72 11/24/2019  ? ? ?Lab Results  ?Component Value Date  ? WBC 5.6 05/15/2020  ? HGB 12.5 05/15/2020  ? HCT 37.3 05/15/2020  ? PLT 233 05/15/2020  ? GLUCOSE 85 11/20/2020  ? CHOL 198 11/20/2020  ? TRIG 103.0 11/20/2020  ? HDL 69.00 11/20/2020  ? LDLCALC 109 (H) 11/20/2020  ? ALT 8 11/20/2020  ? AST 14 11/20/2020  ? NA 138 11/20/2020  ? K 3.8 11/20/2020  ? CL 101 11/20/2020  ? CREATININE 0.68 11/20/2020  ? BUN 12 11/20/2020  ? CO2 30 11/20/2020  ? TSH 0.70 11/20/2020  ? HGBA1C 5.3 11/24/2019  ? MICROALBUR <0.7 11/13/2018  ? ? ?MM 3D SCREEN BREAST BILATERAL ? ?Result Date: 02/27/2021 ?CLINICAL DATA:  Screening. EXAM: DIGITAL SCREENING BILATERAL MAMMOGRAM WITH TOMOSYNTHESIS AND CAD TECHNIQUE: Bilateral screening digital craniocaudal and mediolateral oblique mammograms were obtained. Bilateral screening digital breast tomosynthesis was performed. The images were evaluated with computer-aided detection. COMPARISON:  Previous exam(s). ACR Breast Density Category c: The breast tissue is heterogeneously dense, which may obscure small masses. FINDINGS: Interval bilateral post-reduction changes. There are no findings suspicious for malignancy. IMPRESSION: No mammographic evidence of malignancy. A result letter of this screening mammogram will be mailed directly to  the patient. RECOMMENDATION: Screening mammogram in one year. (Code:SM-B-01Y) BI-RADS CATEGORY  2: Benign Electronically Signed   By: Ileana Roup M.D.   On: 02/27/2021 13:27 ? ? ?Assessment & Plan:  ? ?Problem List Items Addressed This Visit   ? ? Acute maxillary sinusitis  ?  Given chronicity of symptoms, development of  Upper teeth and maxillary  facial pain Will treat with empiric antibiotics, decongestants, prednisone taper, topical decongestant and saline lavage.   Probiotic advised  ?  ?  ? Relevant Medications  ? predniSONE (DELTASONE) 10 MG tablet  ? levofloxacin (LEVAQUIN) 500 MG tablet  ? ? ?I spent 30 minutes dedicated to the care of this patient on the date of this encounter to include pre-visit review of patient's medical history,  most recent  office visits with me and patient's specialists,  most recent ER visit/hospitalization, EKG, imaging studies, Face-to-face time with the patient , and post visit ordering of testing and therapeutics.   ? ?Follow-up: No follow-ups on file. ? ? ?Crecencio Mc, MD ?

## 2021-04-18 NOTE — Assessment & Plan Note (Signed)
Given chronicity of symptoms, development of  Upper teeth and maxillary  facial pain Will treat with empiric antibiotics, decongestants, prednisone taper, topical decongestant and saline lavage.   Probiotic advised  ?

## 2021-04-18 NOTE — Patient Instructions (Signed)
?  I am treating you for sinusitis which is a complication from allergies causing   persistent sinus congestion. ? ? I am prescribing an antibiotic (levaquin ) and a prednisone taper  To manage the infection and the inflammation in your ear/sinuses.  ? ?Consider adding Afrin  every 12 hours for a few days for congestion  ? ?flush your sinuses twice daily with Simply Saline (do over the sink because if you do it right you will spit out globs of mucus) ? ?Please take  (or continue)  a probiotic ( Align, Floraque or Culturelle) daily for 3 weeks   due to the  antibiotic use  to prevent  the  serious antibiotic associated diarrhea  Called clostridium dificile colitis   ?

## 2021-04-27 ENCOUNTER — Encounter: Payer: Self-pay | Admitting: Family Medicine

## 2021-04-27 ENCOUNTER — Ambulatory Visit (INDEPENDENT_AMBULATORY_CARE_PROVIDER_SITE_OTHER): Payer: No Typology Code available for payment source | Admitting: Family Medicine

## 2021-04-27 DIAGNOSIS — M79672 Pain in left foot: Secondary | ICD-10-CM | POA: Insufficient documentation

## 2021-04-27 NOTE — Progress Notes (Signed)
?Tommi Rumps, MD ?Phone: [phone] ? ?Connie Francis is a 51 y.o. female who presents today for same-day visit. ? ?Left foot pain: Patient notes this started 5 days ago.  [place].  She notes some swelling on the dorsum of her left foot and discomfort on the dorsum of her left foot.  Notes it hurts more when she curls her toes under.  She has no specific injury.  She was on prednisone through this past Monday.  She has not taken any anti-inflammatories other than that. ? ?Social History  ? ?Tobacco Use  ?Smoking Status Never  ?Smokeless Tobacco Never  ? ? ?Current Outpatient Medications on File Prior to Visit  ?Medication Sig Dispense Refill  ? ALPRAZolam (XANAX) 0.25 MG tablet Take 1 daily as needed 8 tablet 2  ? ALPRAZolam (XANAX) 0.25 MG tablet Take 1 daily as needed 8 tablet 2  ? amLODipine (NORVASC) 2.5 MG tablet TAKE 1 TABLET (2.5 MG TOTAL) BY MOUTH DAILY. 30 tablet 5  ? amphetamine-dextroamphetamine (ADDERALL XR) 5 MG 24 hr capsule Take 1 daily with breakfast 90 capsule 0  ? amphetamine-dextroamphetamine (ADDERALL XR) 5 MG 24 hr capsule Take 1 daily 90 capsule 0  ? citalopram (CELEXA) 10 MG tablet Take 1 and 1/2 tablet po daily 135 tablet 0  ? estradiol (ESTRACE) 1 MG tablet TAKE 1 TABLET BY MOUTH DAILY. 30 tablet 2  ? hyoscyamine (ANASPAZ) 0.125 MG TBDP disintergrating tablet Place 1 tablet (0.125 mg total) under the tongue every 6 (six) hours as needed. For abdominal /esophageal pain 30 tablet 0  ? Melatonin ([employer] MELATONIN) 5 MG SUBL Take 1 at bedtime as needed 90 tablet 0  ? ondansetron (ZOFRAN) 4 MG tablet Take 1 tablet (4 mg total) by mouth every 8 (eight) hours as needed for nausea or vomiting. 20 tablet 0  ? polyethylene glycol (MIRALAX / GLYCOLAX) packet Take 17 g by mouth daily as needed for mild constipation.    ? Probiotic Product (PROBIOTIC DAILY PO) Take 2 capsules by mouth daily with supper. Gummie    ? ?No current  facility-administered medications on file prior to visit.  ? ? ? ?ROS see history of present illness ? ?Objective ? ?Physical Exam ?Vitals:  ? 04/27/21 1520  ?BP: 120/80  ?Pulse: 81  ?Temp: 98.6 ?F (37 ?C)  ?SpO2: 98%  ? ? ?BP Readings from Last 3 Encounters:  ?04/27/21 120/80  ?04/18/21 128/82  ?02/06/21 120/80  ? ?Wt Readings from Last 3 Encounters:  ?04/27/21 138 lb 12.8 oz (63 kg)  ?04/18/21 136 lb 6.4 oz (61.9 kg)  ?02/06/21 138 lb 3.2 oz (62.7 kg)  ? ? ?Physical Exam ?Musculoskeletal:  ?     Feet: ? ? ? ? ?Assessment/Plan: Please see individual problem list. ? ?Problem List Items Addressed This Visit   ? ? Left foot pain  ?  Suspect extensor tendon strain in her left foot given her symptoms and exam.  Discussed icing 3 times a day for 10 to 15 minutes at a time.  Discussed over-the-counter ibuprofen use 600 mg every 8 hours on a schedule for 3 days and then every 8 hours as needed.  She will take this with food.  If it irritates her stomach she will let us know.  If she is not improving she will contact the office.  If she has worsening symptoms she will let us know as well. ? ?  ?  ? ? ? ?  Return if symptoms worsen or fail to improve. ? ?This visit occurred during the SARS-CoV-2 public health emergency.  Safety protocols were in place, including screening questions prior to the visit, additional usage of staff PPE, and extensive cleaning of exam room while observing appropriate contact time as indicated for disinfecting solutions.  ? ? ?Tommi Rumps, MD ?[place] ? ?

## 2021-04-27 NOTE — Patient Instructions (Signed)
Nice to meet you. ?I suspect you have strained the tendons on the top of your left foot.  Please ice this area 3 times a day for 10 to 15 minutes at a time. ?You can take ibuprofen over-the-counter 600 mg every 8 hours on a schedule for the next 3 days.  After that you can take 600 mg every 8 hours as needed.  You should take this medication with food.  If it causes upset stomach you need to discontinue the ibuprofen and let us know. ?If you are not improving or if you worsen at all please let us know. ?

## 2021-04-27 NOTE — Assessment & Plan Note (Signed)
Suspect extensor tendon strain in her left foot given her symptoms and exam.  Discussed icing 3 times a day for 10 to 15 minutes at a time.  Discussed over-the-counter ibuprofen use 600 mg every 8 hours on a schedule for 3 days and then every 8 hours as needed.  She will take this with food.  If it irritates her stomach she will let us know.  If she is not improving she will contact the office.  If she has worsening symptoms she will let us know as well. ?

## 2021-05-01 ENCOUNTER — Other Ambulatory Visit: Payer: Self-pay | Admitting: Internal Medicine

## 2021-05-01 ENCOUNTER — Other Ambulatory Visit: Payer: Self-pay

## 2021-05-01 MED FILL — Estradiol Tab 1 MG: ORAL | 30 days supply | Qty: 30 | Fill #0 | Status: AC

## 2021-05-04 ENCOUNTER — Other Ambulatory Visit: Payer: Self-pay

## 2021-05-04 MED ORDER — AMPHETAMINE-DEXTROAMPHET ER 5 MG PO CP24
ORAL_CAPSULE | Freq: Every day | ORAL | 0 refills | Status: DC
Start: 2021-05-04 — End: 2021-11-23
  Filled 2021-05-04: qty 90, 90d supply, fill #0

## 2021-05-04 MED ORDER — CITALOPRAM HYDROBROMIDE 10 MG PO TABS
ORAL_TABLET | ORAL | 0 refills | Status: DC
  Filled 2021-05-04: qty 135, 90d supply, fill #0

## 2021-05-04 MED ORDER — ALPRAZOLAM 0.25 MG PO TABS
ORAL_TABLET | ORAL | 2 refills | Status: DC
  Filled 2021-06-26: qty 8, 30d supply, fill #0
  Filled 2021-09-12: qty 8, 30d supply, fill #1

## 2021-05-17 ENCOUNTER — Other Ambulatory Visit: Payer: Self-pay

## 2021-06-01 ENCOUNTER — Other Ambulatory Visit: Payer: Self-pay

## 2021-06-01 MED FILL — Estradiol Tab 1 MG: ORAL | 30 days supply | Qty: 30 | Fill #1 | Status: AC

## 2021-06-06 ENCOUNTER — Other Ambulatory Visit: Payer: Self-pay

## 2021-06-07 ENCOUNTER — Other Ambulatory Visit: Payer: Self-pay

## 2021-06-07 ENCOUNTER — Telehealth: Payer: No Typology Code available for payment source | Admitting: Physician Assistant

## 2021-06-07 DIAGNOSIS — J208 Acute bronchitis due to other specified organisms: Secondary | ICD-10-CM

## 2021-06-07 DIAGNOSIS — B9689 Other specified bacterial agents as the cause of diseases classified elsewhere: Secondary | ICD-10-CM

## 2021-06-07 MED ORDER — DOXYCYCLINE HYCLATE 100 MG PO TABS
100.0000 mg | ORAL_TABLET | Freq: Two times a day (BID) | ORAL | 0 refills | Status: DC
  Filled 2021-06-07: qty 14, 7d supply, fill #0

## 2021-06-07 MED ORDER — BENZONATATE 100 MG PO CAPS
100.0000 mg | ORAL_CAPSULE | Freq: Three times a day (TID) | ORAL | 0 refills | Status: DC | PRN
  Filled 2021-06-07: qty 30, 10d supply, fill #0

## 2021-06-07 NOTE — Progress Notes (Signed)

## 2021-06-07 NOTE — Progress Notes (Signed)
I have spent 5 minutes in review of e-visit questionnaire, review and updating patient chart, medical decision making and response to patient.   Ross Bender Cody Brettany Sydney, PA-C    

## 2021-06-08 ENCOUNTER — Other Ambulatory Visit: Payer: Self-pay

## 2021-06-08 MED ORDER — AZITHROMYCIN 250 MG PO TABS
ORAL_TABLET | ORAL | 0 refills | Status: AC
  Filled 2021-06-08: qty 6, 5d supply, fill #0

## 2021-06-08 NOTE — Addendum Note (Signed)
Addended by: Brunetta Jeans on: 06/08/2021 12:50 PM   Modules accepted: Orders

## 2021-06-15 ENCOUNTER — Other Ambulatory Visit: Payer: Self-pay | Admitting: Internal Medicine

## 2021-06-15 ENCOUNTER — Other Ambulatory Visit: Payer: Self-pay

## 2021-06-15 MED ORDER — AMLODIPINE BESYLATE 2.5 MG PO TABS
ORAL_TABLET | Freq: Every day | ORAL | 5 refills | Status: DC
  Filled 2021-06-15: qty 90, 90d supply, fill #0
  Filled 2021-09-12: qty 90, 90d supply, fill #1

## 2021-06-26 ENCOUNTER — Other Ambulatory Visit: Payer: Self-pay

## 2021-06-26 MED FILL — Estradiol Tab 1 MG: ORAL | 30 days supply | Qty: 30 | Fill #2 | Status: AC

## 2021-06-28 ENCOUNTER — Other Ambulatory Visit: Payer: Self-pay

## 2021-06-29 ENCOUNTER — Other Ambulatory Visit: Payer: Self-pay

## 2021-07-03 ENCOUNTER — Other Ambulatory Visit: Payer: Self-pay

## 2021-07-04 ENCOUNTER — Other Ambulatory Visit: Payer: Self-pay

## 2021-07-30 ENCOUNTER — Other Ambulatory Visit: Payer: Self-pay | Admitting: Internal Medicine

## 2021-07-30 MED ORDER — ESTRADIOL 1 MG PO TABS
ORAL_TABLET | Freq: Every day | ORAL | 2 refills | Status: DC
Start: 2021-07-30 — End: 2021-10-29
  Filled 2021-07-30: qty 30, 30d supply, fill #0
  Filled 2021-08-30: qty 30, 30d supply, fill #1
  Filled 2021-09-27: qty 30, 30d supply, fill #2

## 2021-07-31 ENCOUNTER — Other Ambulatory Visit: Payer: Self-pay

## 2021-07-31 MED ORDER — AMPHETAMINE-DEXTROAMPHET ER 5 MG PO CP24
ORAL_CAPSULE | Freq: Every day | ORAL | 0 refills | Status: DC
  Filled 2021-07-31: qty 90, 90d supply, fill #0

## 2021-07-31 MED ORDER — CITALOPRAM HYDROBROMIDE 10 MG PO TABS
ORAL_TABLET | ORAL | 0 refills | Status: DC
  Filled 2021-07-31: qty 135, 90d supply, fill #0

## 2021-07-31 MED ORDER — ALPRAZOLAM 0.25 MG PO TABS
ORAL_TABLET | ORAL | 2 refills | Status: DC
  Filled 2021-07-31: qty 8, 30d supply, fill #0
  Filled 2021-10-26: qty 8, 30d supply, fill #1

## 2021-08-01 ENCOUNTER — Other Ambulatory Visit: Payer: Self-pay

## 2021-08-30 ENCOUNTER — Other Ambulatory Visit: Payer: Self-pay

## 2021-09-13 ENCOUNTER — Other Ambulatory Visit: Payer: Self-pay

## 2021-09-20 ENCOUNTER — Encounter: Payer: Self-pay | Admitting: Internal Medicine

## 2021-09-21 ENCOUNTER — Encounter: Payer: Self-pay | Admitting: Internal Medicine

## 2021-09-21 ENCOUNTER — Other Ambulatory Visit: Payer: Self-pay

## 2021-09-21 ENCOUNTER — Other Ambulatory Visit (HOSPITAL_COMMUNITY)
Admission: RE | Admit: 2021-09-21 | Discharge: 2021-09-21 | Disposition: A | Discharge status: Home / Self Care | Payer: No Typology Code available for payment source | Source: Ambulatory Visit | Attending: Internal Medicine | Admitting: Internal Medicine

## 2021-09-21 ENCOUNTER — Ambulatory Visit (INDEPENDENT_AMBULATORY_CARE_PROVIDER_SITE_OTHER): Payer: No Typology Code available for payment source | Admitting: Internal Medicine

## 2021-09-21 VITALS — BP 118/80 | HR 87 | Temp 98.3°F | Resp 16 | Ht 62.0 in | Wt 136.0 lb

## 2021-09-21 DIAGNOSIS — N760 Acute vaginitis: Secondary | ICD-10-CM | POA: Insufficient documentation

## 2021-09-21 MED ORDER — FLUCONAZOLE 150 MG PO TABS
150.0000 mg | ORAL_TABLET | Freq: Every day | ORAL | 0 refills | Status: DC
  Filled 2021-09-21: qty 2, 2d supply, fill #0

## 2021-09-21 NOTE — Assessment & Plan Note (Signed)
Will treat empirically with fluconazole for 2 days while awaiting results of aptima swab.

## 2021-09-21 NOTE — Progress Notes (Signed)
Subjective:  Patient ID: Connie Francis, female    DOB: [birth year]  Age: 51 y.o. MRN: [id number]  CC: The encounter diagnosis was Acute vaginitis.   HPI Connie Francis presents for evaluation of  Chief Complaint  Patient presents with   Pelvic Pain    Pt states she has been having pelvic pain off an on for a few months  Pt states has an vaginal odor  Denies any itching , does have some slight burning  No discharge     51 yr old female, sexually monogamous, post remote hysterectomy, presents with 2 day history of self perceived vaginal odor and slight burning sensation without dysuria, bleeding flank pain or fevers. She has chronic lower abdominal pain aggravated by constipation .  No recent sexual intercourse.  Swims regularly in a public pool for exercise, showers once home.     Outpatient Medications Prior to Visit  Medication Sig Dispense Refill   ALPRAZolam (XANAX) 0.25 MG tablet Take 1 daily as needed 8 tablet 2   amLODipine (NORVASC) 2.5 MG tablet TAKE 1 TABLET (2.5 MG TOTAL) BY MOUTH DAILY. 30 tablet 5   amphetamine-dextroamphetamine (ADDERALL XR) 5 MG 24 hr capsule Take 1 daily 90 capsule 0   citalopram (CELEXA) 10 MG tablet Take 1 and 1/2 tablet po daily 135 tablet 0   estradiol (ESTRACE) 1 MG tablet TAKE 1 TABLET BY MOUTH DAILY. 30 tablet 2   hyoscyamine (ANASPAZ) 0.125 MG TBDP disintergrating tablet Place 1 tablet (0.125 mg total) under the tongue every 6 (six) hours as needed. For abdominal /esophageal pain 30 tablet 0   Melatonin ([employer] MELATONIN) 5 MG SUBL Take 1 at bedtime as needed 90 tablet 0   ondansetron (ZOFRAN) 4 MG tablet Take 1 tablet (4 mg total) by mouth every 8 (eight) hours as needed for nausea or vomiting. 20 tablet 0   polyethylene glycol (MIRALAX / GLYCOLAX) packet Take 17 g by mouth daily as needed for mild constipation.     Probiotic Product (PROBIOTIC DAILY PO) Take 2 capsules by mouth daily with supper. Gummie      amphetamine-dextroamphetamine (ADDERALL XR) 5 MG 24 hr capsule Take 1 daily with breakfast 90 capsule 0   amphetamine-dextroamphetamine (ADDERALL XR) 5 MG 24 hr capsule Take 1 daily 90 capsule 0   amphetamine-dextroamphetamine (ADDERALL XR) 5 MG 24 hr capsule Take 1 daily 90 capsule 0   benzonatate (TESSALON) 100 MG capsule Take 1 capsule (100 mg total) by mouth 3 (three) times daily as needed for cough. (Patient not taking: Reported on 09/21/2021) 30 capsule 0   ALPRAZolam (XANAX) 0.25 MG tablet Take 1 daily as needed 8 tablet 2   ALPRAZolam (XANAX) 0.25 MG tablet Take 1 daily as needed 8 tablet 2   ALPRAZolam (XANAX) 0.25 MG tablet Take 1 daily as needed 8 tablet 2   No facility-administered medications prior to visit.    Review of Systems;  Patient denies headache, fevers, malaise, unintentional weight loss, skin rash, eye pain, sinus congestion and sinus pain, sore throat, dysphagia,  hemoptysis , cough, dyspnea, wheezing, chest pain, palpitations, orthopnea, edema, abdominal pain, nausea, melena, diarrhea,  flank pain, dysuria, hematuria, urinary  Frequency, nocturia, numbness, tingling, seizures,  Focal weakness, Loss of consciousness,  Tremor, insomnia, depression, anxiety, and suicidal ideation.      Objective:  BP 118/80   Pulse 87   Temp 98.3 F (36.8 C) (Oral)   Resp 16   Ht '5\' 2"'$  (1.575 m)   Wt  136 lb (61.7 kg)   SpO2 98%   BMI 24.87 kg/m   BP Readings from Last 3 Encounters:  09/21/21 118/80  04/27/21 120/80  04/18/21 128/82    Wt Readings from Last 3 Encounters:  09/21/21 136 lb (61.7 kg)  04/27/21 138 lb 12.8 oz (63 kg)  04/18/21 136 lb 6.4 oz (61.9 kg)    General Appearance:    Alert, cooperative, no distress, appears stated age  Head:    Normocephalic, without obvious abnormality, atraumatic  Eyes:    PERRL, conjunctiva/corneas clear, EOM's intact, fundi    benign, both eyes  Ears:    Normal TM's and external ear canals, both ears  Nose:   Nares normal,  septum midline, mucosa normal, no drainage    or sinus tenderness  Throat:   Lips, mucosa, and tongue normal; teeth and gums normal  Neck:   Supple, symmetrical, trachea midline, no adenopathy;    thyroid:  no enlargement/tenderness/nodules; no carotid   bruit or JVD  Back:     Symmetric, no curvature, ROM normal, no CVA tenderness  Lungs:     Clear to auscultation bilaterally, respirations unlabored  Chest Wall:    No tenderness or deformity   Heart:    Regular rate and rhythm, S1 and S2 normal, no murmur, rub   or gallop  Breast Exam:    No tenderness, masses, or nipple abnormality  Abdomen:     Soft, non-tender, bowel sounds active all four quadrants,    no masses, no organomegaly  Genitalia:    Pelvic: cervix surgically absent, external genitalia normal, no adnexal masses or tenderness,  rectovaginal septum normal, uterus surgically  absent and vagina with white curdish  discharge  Extremities:   Extremities normal, atraumatic, no cyanosis or edema  Pulses:   2+ and symmetric all extremities  Skin:   Skin color, texture, turgor normal, no rashes or lesions  Lymph nodes:   Cervical, supraclavicular, and axillary nodes normal  Neurologic:   CNII-XII intact, normal strength, sensation and reflexes    throughout    Lab Results  Component Value Date   HGBA1C 5.3 11/24/2019   HGBA1C 4.8 07/20/2015   HGBA1C 5.3 12/05/2014    Lab Results  Component Value Date   CREATININE 0.68 11/20/2020   CREATININE 0.71 05/15/2020   CREATININE 0.72 11/24/2019    Lab Results  Component Value Date   WBC 5.6 05/15/2020   HGB 12.5 05/15/2020   HCT 37.3 05/15/2020   PLT 233 05/15/2020   GLUCOSE 85 11/20/2020   CHOL 198 11/20/2020   TRIG 103.0 11/20/2020   HDL 69.00 11/20/2020   LDLCALC 109 (H) 11/20/2020   ALT 8 11/20/2020   AST 14 11/20/2020   NA 138 11/20/2020   K 3.8 11/20/2020   CL 101 11/20/2020   CREATININE 0.68 11/20/2020   BUN 12 11/20/2020   CO2 30 11/20/2020   TSH 0.70  11/20/2020   HGBA1C 5.3 11/24/2019   MICROALBUR <0.7 11/13/2018    MM 3D SCREEN BREAST BILATERAL  Result Date: 02/27/2021 CLINICAL DATA:  Screening. EXAM: DIGITAL SCREENING BILATERAL MAMMOGRAM WITH TOMOSYNTHESIS AND CAD TECHNIQUE: Bilateral screening digital craniocaudal and mediolateral oblique mammograms were obtained. Bilateral screening digital breast tomosynthesis was performed. The images were evaluated with computer-aided detection. COMPARISON:  Previous exam(s). ACR Breast Density Category c: The breast tissue is heterogeneously dense, which may obscure small masses. FINDINGS: Interval bilateral post-reduction changes. There are no findings suspicious for malignancy. IMPRESSION: No mammographic evidence of malignancy.  A result letter of this screening mammogram will be mailed directly to the patient. RECOMMENDATION: Screening mammogram in one year. (Code:SM-B-01Y) BI-RADS CATEGORY  2: Benign Electronically Signed   By: Ileana Roup M.D.   On: 02/27/2021 13:27   Assessment & Plan:   Problem List Items Addressed This Visit     Acute vaginitis - Primary    Will treat empirically with fluconazole for 2 days while awaiting results of aptima swab.       Relevant Orders   Cervicovaginal ancillary only( [place])    Follow-up: No follow-ups on file.   Crecencio Mc, MD

## 2021-09-24 LAB — CERVICOVAGINAL ANCILLARY ONLY
Bacterial Vaginitis (gardnerella): NEGATIVE
Candida Glabrata: NEGATIVE
Candida Vaginitis: NEGATIVE
Chlamydia: NEGATIVE
Comment: NEGATIVE
Comment: NEGATIVE
Comment: NEGATIVE
Comment: NEGATIVE
Comment: NEGATIVE
Comment: NORMAL
Neisseria Gonorrhea: NEGATIVE
Trichomonas: NEGATIVE

## 2021-09-28 ENCOUNTER — Other Ambulatory Visit: Payer: Self-pay

## 2021-10-26 ENCOUNTER — Other Ambulatory Visit: Payer: Self-pay

## 2021-10-26 ENCOUNTER — Other Ambulatory Visit: Payer: Self-pay | Admitting: Family

## 2021-10-26 ENCOUNTER — Other Ambulatory Visit: Payer: Self-pay | Admitting: Internal Medicine

## 2021-10-26 MED ORDER — ALPRAZOLAM 0.25 MG PO TABS
0.2500 mg | ORAL_TABLET | Freq: Every day | ORAL | 2 refills | Status: DC | PRN
  Filled 2021-10-26: qty 8, 8d supply, fill #0

## 2021-10-26 MED ORDER — CITALOPRAM HYDROBROMIDE 10 MG PO TABS
15.0000 mg | ORAL_TABLET | Freq: Every day | ORAL | 0 refills | Status: DC
  Filled 2021-10-26: qty 135, 90d supply, fill #0

## 2021-10-26 MED ORDER — AMPHETAMINE-DEXTROAMPHET ER 5 MG PO CP24
5.0000 mg | ORAL_CAPSULE | Freq: Every day | ORAL | 0 refills | Status: DC
  Filled 2021-12-11 (×2): qty 90, 90d supply, fill #0

## 2021-10-26 MED ORDER — MELATONIN 5 MG SL SUBL: SUBLINGUAL_TABLET | SUBLINGUAL | 0 refills | Status: DC

## 2021-10-29 ENCOUNTER — Other Ambulatory Visit: Payer: Self-pay

## 2021-10-29 MED FILL — Estradiol Tab 1 MG: ORAL | 30 days supply | Qty: 30 | Fill #0 | Status: AC

## 2021-11-04 ENCOUNTER — Other Ambulatory Visit: Payer: Self-pay

## 2021-11-06 ENCOUNTER — Other Ambulatory Visit: Payer: Self-pay

## 2021-11-06 MED ORDER — PEG 3350-KCL-NA BICARB-NACL 420 G PO SOLR
ORAL | 0 refills | Status: DC
  Filled 2021-11-06: qty 4000, 1d supply, fill #0

## 2021-11-13 ENCOUNTER — Other Ambulatory Visit: Payer: Self-pay | Admitting: Internal Medicine

## 2021-11-13 DIAGNOSIS — Z1231 Encounter for screening mammogram for malignant neoplasm of breast: Secondary | ICD-10-CM

## 2021-11-14 ENCOUNTER — Ambulatory Visit: Payer: No Typology Code available for payment source | Admitting: Dermatology

## 2021-11-14 ENCOUNTER — Encounter: Payer: Self-pay | Admitting: Dermatology

## 2021-11-14 DIAGNOSIS — L821 Other seborrheic keratosis: Secondary | ICD-10-CM | POA: Diagnosis not present

## 2021-11-14 NOTE — Progress Notes (Signed)
   Follow-Up Visit   Subjective  Connie Francis is a 51 y.o. female who presents for the following: Spot(Left upper abdomen. Dur: few months. Irregular border).  The patient has spots, moles and lesions to be evaluated, some may be new or changing and the patient has concerns that these could be cancer.  The following portions of the chart were reviewed this encounter and updated as appropriate:  Tobacco  Allergies  Meds  Problems  Med Hx  Surg Hx  Fam Hx      Review of Systems: No other skin or systemic complaints except as noted in HPI or Assessment and Plan.   Objective  Well appearing patient in no apparent distress; mood and affect are within normal limits.  A focused examination was performed including abdomen. Relevant physical exam findings are noted in the Assessment and Plan.  Left Abdomen (side) - Upper Stuck-on, waxy, tan-brown papule or plaque --Discussed benign etiology and prognosis.    Assessment & Plan  Seborrheic keratosis Left Abdomen (side) - Upper  Reassured benign age-related growth.  Recommend observation.  Discussed cryotherapy if spot(s) become irritated or inflamed.   Return if symptoms worsen or fail to improve.  I, Emelia Salisbury, CMA, am acting as scribe for Forest Gleason, MD.  Documentation: I have reviewed the above documentation for accuracy and completeness, and I agree with the above.  Forest Gleason, MD

## 2021-11-14 NOTE — Patient Instructions (Signed)
Seborrheic Keratosis  What causes seborrheic keratoses? Seborrheic keratoses are harmless, common skin growths that first appear during adult life.  As time goes by, more growths appear.  Some people may develop a large number of them.  Seborrheic keratoses appear on both covered and uncovered body parts.  They are not caused by sunlight.  The tendency to develop seborrheic keratoses can be inherited.  They vary in color from skin-colored to gray, brown, or even black.  They can be either smooth or have a rough, warty surface.   Seborrheic keratoses are superficial and look as if they were stuck on the skin.  Under the microscope this type of keratosis looks like layers upon layers of skin.  That is why at times the top layer may seem to fall off, but the rest of the growth remains and re-grows.    Treatment Seborrheic keratoses do not need to be treated, but can easily be removed in the office.  Seborrheic keratoses often cause symptoms when they rub on clothing or jewelry.  Lesions can be in the way of shaving.  If they become inflamed, they can cause itching, soreness, or burning.  Removal of a seborrheic keratosis can be accomplished by freezing, burning, or surgery. If any spot bleeds, scabs, or grows rapidly, please return to have it checked, as these can be an indication of a skin cancer.   Due to recent changes in healthcare laws, you may see results of your pathology and/or laboratory studies on MyChart before the doctors have had a chance to review them. We understand that in some cases there may be results that are confusing or concerning to you. Please understand that not all results are received at the same time and often the doctors may need to interpret multiple results in order to provide you with the best plan of care or course of treatment. Therefore, we ask that you please give us 2 business days to thoroughly review all your results before contacting the office for clarification.  Should we see a critical lab result, you will be contacted sooner.   If You Need Anything After Your Visit  If you have any questions or concerns for your doctor, please call our main line at [phone] and press option 4 to reach your doctor's medical assistant. If no one answers, please leave a voicemail as directed and we will return your call as soon as possible. Messages left after 4 pm will be answered the following business day.   You may also send us a message via MyChart. We typically respond to MyChart messages within 1-2 business days.  For prescription refills, please ask your pharmacy to contact our office. Our fax number is [phone].  If you have an urgent issue when the clinic is closed that cannot wait until the next business day, you can page your doctor at the number below.    Please note that while we do our best to be available for urgent issues outside of office hours, we are not available 24/7.   If you have an urgent issue and are unable to reach us, you may choose to seek medical care at your doctor's office, retail clinic, urgent care center, or emergency room.  If you have a medical emergency, please immediately call 911 or go to the emergency department.  Pager Numbers  - Dr. Kowalski: [phone]  - Dr. Moye: [phone]  - Dr. Stewart: [phone]  In the event of inclement weather, please call our main line   at [phone] for an update on the status of any delays or closures.  Dermatology Medication Tips: Please keep the boxes that topical medications come in in order to help keep track of the instructions about where and how to use these. Pharmacies typically print the medication instructions only on the boxes and not directly on the medication tubes.   If your medication is too expensive, please contact our office at [phone] option 4 or send us a message through MyChart.   We are unable to tell what your co-pay for medications will be  in advance as this is different depending on your insurance coverage. However, we may be able to find a substitute medication at lower cost or fill out paperwork to get insurance to cover a needed medication.   If a prior authorization is required to get your medication covered by your insurance company, please allow us 1-2 business days to complete this process.  Drug prices often vary depending on where the prescription is filled and some pharmacies may offer cheaper prices.  The website www.goodrx.com contains coupons for medications through different pharmacies. The prices here do not account for what the cost may be with help from insurance (it may be cheaper with your insurance), but the website can give you the price if you did not use any insurance.  - You can print the associated coupon and take it with your prescription to the pharmacy.  - You may also stop by our office during regular business hours and pick up a GoodRx coupon card.  - If you need your prescription sent electronically to a different pharmacy, notify our office through [facility] MyChart or by phone at [phone] option 4.     Si Usted Necesita Algo Despus de Su Visita  Tambin puede enviarnos un mensaje a travs de MyChart. Por lo general respondemos a los mensajes de MyChart en el transcurso de 1 a 2 das hbiles.  Para renovar recetas, por favor pida a su farmacia que se ponga en contacto con nuestra oficina. Nuestro nmero de fax es el [phone].  Si tiene un asunto urgente cuando la clnica est cerrada y que no puede esperar hasta el siguiente da hbil, puede llamar/localizar a su doctor(a) al nmero que aparece a continuacin.   Por favor, tenga en cuenta que aunque hacemos todo lo posible para estar disponibles para asuntos urgentes fuera del horario de oficina, no estamos disponibles las 24 horas del da, los 7 das de la semana.   Si tiene un problema urgente y no puede comunicarse con nosotros,  puede optar por buscar atencin mdica  en el consultorio de su doctor(a), en una clnica privada, en un centro de atencin urgente o en una sala de emergencias.  Si tiene una emergencia mdica, por favor llame inmediatamente al 911 o vaya a la sala de emergencias.  Nmeros de bper  - Dr. Kowalski: [phone]  - Dra. Moye: [phone]  - Dra. Stewart: [phone]  En caso de inclemencias del tiempo, por favor llame a nuestra lnea principal al [phone] para una actualizacin sobre el estado de cualquier retraso o cierre.  Consejos para la medicacin en dermatologa: Por favor, guarde las cajas en las que vienen los medicamentos de uso tpico para ayudarle a seguir las instrucciones sobre dnde y cmo usarlos. Las farmacias generalmente imprimen las instrucciones del medicamento slo en las cajas y no directamente en los tubos del medicamento.   Si su medicamento es muy caro, por favor, pngase en contacto   con nuestra oficina llamando al [phone] y presione la opcin 4 o envenos un mensaje a travs de MyChart.   No podemos decirle cul ser su copago por los medicamentos por adelantado ya que esto es diferente dependiendo de la cobertura de su seguro. Sin embargo, es posible que podamos encontrar un medicamento sustituto a menor costo o llenar un formulario para que el seguro cubra el medicamento que se considera necesario.   Si se requiere una autorizacin previa para que su compaa de seguros cubra su medicamento, por favor permtanos de 1 a 2 das hbiles para completar este proceso.  Los precios de los medicamentos varan con frecuencia dependiendo del lugar de dnde se surte la receta y alguna farmacias pueden ofrecer precios ms baratos.  El sitio web www.goodrx.com tiene cupones para medicamentos de diferentes farmacias. Los precios aqu no tienen en cuenta lo que podra costar con la ayuda del seguro (puede ser ms barato con su seguro), pero el sitio web puede darle el  precio si no utiliz ningn seguro.  - Puede imprimir el cupn correspondiente y llevarlo con su receta a la farmacia.  - Tambin puede pasar por nuestra oficina durante el horario de atencin regular y recoger una tarjeta de cupones de GoodRx.  - Si necesita que su receta se enve electrnicamente a una farmacia diferente, informe a nuestra oficina a travs de MyChart de [facility] o por telfono llamando al [phone] y presione la opcin 4.  

## 2021-11-19 ENCOUNTER — Encounter: Payer: Self-pay | Admitting: Dermatology

## 2021-11-22 ENCOUNTER — Other Ambulatory Visit: Payer: Self-pay

## 2021-11-22 ENCOUNTER — Encounter: Payer: Self-pay | Admitting: Internal Medicine

## 2021-11-22 ENCOUNTER — Ambulatory Visit (INDEPENDENT_AMBULATORY_CARE_PROVIDER_SITE_OTHER): Payer: No Typology Code available for payment source | Admitting: Internal Medicine

## 2021-11-22 VITALS — BP 128/78 | HR 82 | Temp 97.8°F | Ht 62.0 in | Wt 138.8 lb

## 2021-11-22 DIAGNOSIS — I429 Cardiomyopathy, unspecified: Secondary | ICD-10-CM

## 2021-11-22 DIAGNOSIS — R61 Generalized hyperhidrosis: Secondary | ICD-10-CM | POA: Diagnosis not present

## 2021-11-22 DIAGNOSIS — D649 Anemia, unspecified: Secondary | ICD-10-CM | POA: Diagnosis not present

## 2021-11-22 DIAGNOSIS — Z79899 Other long term (current) drug therapy: Secondary | ICD-10-CM

## 2021-11-22 DIAGNOSIS — R7301 Impaired fasting glucose: Secondary | ICD-10-CM

## 2021-11-22 DIAGNOSIS — R252 Cramp and spasm: Secondary | ICD-10-CM

## 2021-11-22 DIAGNOSIS — Z0001 Encounter for general adult medical examination with abnormal findings: Secondary | ICD-10-CM

## 2021-11-22 DIAGNOSIS — I10 Essential (primary) hypertension: Secondary | ICD-10-CM | POA: Diagnosis not present

## 2021-11-22 LAB — LIPID PANEL
Cholesterol: 205 mg/dL — ABNORMAL HIGH (ref 0–200)
HDL: 73.7 mg/dL (ref >39.00)
LDL Cholesterol: 108 mg/dL — ABNORMAL HIGH (ref 0–99)
NonHDL: 131.31
Total CHOL/HDL Ratio: 3
Triglycerides: 117 mg/dL (ref 0.0–149.0)
VLDL: 23.4 mg/dL (ref 0.0–40.0)

## 2021-11-22 LAB — CBC WITH DIFFERENTIAL/PLATELET
Basophils Absolute: 0.1 10*3/uL (ref 0.0–0.1)
Basophils Relative: 1.3 % (ref 0.0–3.0)
Eosinophils Absolute: 0.1 10*3/uL (ref 0.0–0.7)
Eosinophils Relative: 1 % (ref 0.0–5.0)
HCT: 35.6 % — ABNORMAL LOW (ref 36.0–46.0)
Hemoglobin: 11.7 g/dL — ABNORMAL LOW (ref 12.0–15.0)
Lymphocytes Relative: 25.1 % (ref 12.0–46.0)
Lymphs Abs: 1.6 10*3/uL (ref 0.7–4.0)
MCHC: 32.9 g/dL (ref 30.0–36.0)
MCV: 90.1 fl (ref 78.0–100.0)
Monocytes Absolute: 0.4 10*3/uL (ref 0.1–1.0)
Monocytes Relative: 6.8 % (ref 3.0–12.0)
Neutro Abs: 4.1 10*3/uL (ref 1.4–7.7)
Neutrophils Relative %: 65.8 % (ref 43.0–77.0)
Platelets: 272 10*3/uL (ref 150.0–400.0)
RBC: 3.95 Mil/uL (ref 3.87–5.11)
RDW: 12.7 % (ref 11.5–15.5)
WBC: 6.3 10*3/uL (ref 4.0–10.5)

## 2021-11-22 LAB — COMPREHENSIVE METABOLIC PANEL
ALT: 9 U/L (ref 0–35)
AST: 13 U/L (ref 0–37)
Albumin: 4 g/dL (ref 3.5–5.2)
Alkaline Phosphatase: 72 U/L (ref 39–117)
BUN: 11 mg/dL (ref 6–23)
CO2: 29 mEq/L (ref 19–32)
Calcium: 8.6 mg/dL (ref 8.4–10.5)
Chloride: 102 mEq/L (ref 96–112)
Creatinine, Ser: 0.61 mg/dL (ref 0.40–1.20)
GFR: 105.08 mL/min (ref >60.00)
Glucose, Bld: 84 mg/dL (ref 70–99)
Potassium: 3.7 mEq/L (ref 3.5–5.1)
Sodium: 138 mEq/L (ref 135–145)
Total Bilirubin: 0.3 mg/dL (ref 0.2–1.2)
Total Protein: 6.7 g/dL (ref 6.0–8.3)

## 2021-11-22 LAB — HEMOGLOBIN A1C: Hgb A1c MFr Bld: 5.3 % (ref 4.6–6.5)

## 2021-11-22 LAB — TSH: TSH: 0.68 u[IU]/mL (ref 0.35–5.50)

## 2021-11-22 MED ORDER — ESTRADIOL 0.1 MG/24HR TD PTTW
1.0000 | MEDICATED_PATCH | TRANSDERMAL | 12 refills | Status: DC
  Filled 2021-11-22: qty 8, 28d supply, fill #0
  Filled 2021-12-20: qty 8, 28d supply, fill #1
  Filled 2022-01-17: qty 8, 28d supply, fill #2
  Filled 2022-02-13: qty 8, 28d supply, fill #3
  Filled 2022-03-18: qty 8, 28d supply, fill #4

## 2021-11-22 NOTE — Progress Notes (Addendum)
The patient is here for annual preventive examination and management of other chronic and acute problems.   The risk factors are reflected in the social history.   The roster of all physicians providing medical care to patient - is listed in the Snapshot section of the chart.   Activities of daily living:  The patient is 100% independent in all ADLs: dressing, toileting, feeding as well as independent mobility   Home safety : The patient has smoke detectors in the home. They wear seatbelts.  There are no unsecured firearms at home. There is no violence in the home.    There is no risks for hepatitis, STDs or HIV. There is no   history of blood transfusion. They have no travel history to infectious disease endemic areas of the world.   The patient has seen their dentist in the last six month. They have seen their eye doctor in the last year. The patinet  denies slight hearing difficulty with regard to whispered voices and some television programs.  They have deferred audiologic testing in the last year.  They do not  have excessive sun exposure. Discussed the need for sun protection: hats, long sleeves and use of sunscreen if there is significant sun exposure.    Diet: the importance of a healthy diet is discussed. They do have a healthy diet.   The benefits of regular aerobic exercise were discussed. The patient  is not exercising  regularly    Depression screen: there are no signs or vegative symptoms of depression- irritability, change in appetite, anhedonia, sadness/tearfullness.   The following portions of the patient's history were reviewed and updated as appropriate: allergies, current medications, past family history, past medical history,  past surgical history, past social history  and problem list.   Visual acuity was not assessed per patient preference since the patient has regular follow up with an  ophthalmologist. Hearing and body mass index were assessed and reviewed.     During the course of the visit the patient was educated and counseled about appropriate screening and preventive services including : fall prevention , diabetes screening, nutrition counseling, colorectal cancer screening, and recommended immunizations.    Chief Complaint:   menopause: increased frequency of hot flashes and vaginal dryness without HRT none  Review of Symptoms  Patient denies headache, fevers, malaise, unintentional weight loss, skin rash, eye pain, sinus congestion and sinus pain, sore throat, dysphagia,  hemoptysis , cough, dyspnea, wheezing, chest pain, palpitations, orthopnea, edema, abdominal pain, nausea, melena, diarrhea, constipation, flank pain, dysuria, hematuria, urinary  Frequency, nocturia, numbness, tingling, seizures,  Focal weakness, Loss of consciousness,  Tremor, insomnia, depression, anxiety, and suicidal ideation.    Physical Exam:  BP 128/78 (BP Location: Left Arm, Patient Position: Sitting, Cuff Size: Normal)   Pulse 82   Temp 97.8 F (36.6 C) (Oral)   Ht '5\' 2"'$  (1.575 m)   Wt 138 lb 12.8 oz (63 kg)   SpO2 95%   BMI 25.39 kg/m    General appearance: alert, cooperative and appears stated age Head: Normocephalic, without obvious abnormality, atraumatic Eyes: conjunctivae/corneas clear. PERRL, EOM's intact. Fundi benign. Ears: normal TM's and external ear canals both ears Nose: Nares normal. Septum midline. Mucosa normal. No drainage or sinus tenderness. Throat: lips, mucosa, and tongue normal; teeth and gums normal Neck: no adenopathy, no carotid bruit, no JVD, supple, symmetrical, trachea midline and thyroid not enlarged, symmetric, no tenderness/mass/nodules Lungs: clear to auscultation bilaterally Breasts: bilateral breast reduction  scars.  Otherwise normal appearance, no masses or tenderness Heart: regular rate and rhythm, S1, S2 normal, no murmur, click, rub or gallop Abdomen: soft, non-tender; bowel sounds normal; no masses,  no  organomegaly Extremities: extremities normal, atraumatic, no cyanosis or edema Pulses: 2+ and symmetric Skin: Skin color, texture, turgor normal. No rashes or lesions Neurologic: Alert and oriented X 3, normal strength and tone. Normal symmetric reflexes. Normal coordination and gait.     Assessment and Plan:  Cardiomyopathy, unspecified type [facility]) Reviewed prior cardiology workup   Encounter for general adult medical examination with abnormal findings age appropriate education and counseling updated, referrals for preventative services and immunizations addressed, dietary and smoking counseling addressed, most recent labs reviewed.  I have personally reviewed and have noted:   1) the patient's medical and social history 2) The pt's use of alcohol, tobacco, and illicit drugs 3) The patient's current medications and supplements 4) Functional ability including ADL's, fall risk, home safety risk, hearing and visual impairment 5) Diet and physical activities 6) Evidence for depression or mood disorder 7) The patient's height, weight, and BMI have been recorded in the chart  I have made referrals, and provided counseling and education based on review of the above   Night sweats Estrogen weaning was done at  last visit. But she has become plagued wit hot flashes discussed resuming HT with estrogen patch .  She is s/p hysterectomy  Essential hypertension she reports compliance with medication regimen    She is not using NSAIDs daily.   She has been checking her BP at home using a calibrated machine and reports that her most recent readings have been over 130/80  But< 150/80 . Continue use of amlodipine 2.5 mg daily    Updated Medication List Outpatient Encounter Medications as of 11/22/2021  Medication Sig   amLODipine (NORVASC) 2.5 MG tablet TAKE 1 TABLET (2.5 MG TOTAL) BY MOUTH DAILY.   citalopram (CELEXA) 10 MG tablet Take 1.5 tablets (15 mg total) by mouth daily.   estradiol  (VIVELLE-DOT) 0.1 MG/24HR patch Place 1 patch (0.1 mg total) onto the skin 2 (two) times a week.   fluconazole (DIFLUCAN) 150 MG tablet Take 1 tablet (150 mg total) by mouth daily.   hyoscyamine (ANASPAZ) 0.125 MG TBDP disintergrating tablet Place 1 tablet (0.125 mg total) under the tongue every 6 (six) hours as needed. For abdominal /esophageal pain   Melatonin ([employer] MELATONIN) 5 MG SUBL Take 1 at bedtime as needed   ondansetron (ZOFRAN) 4 MG tablet Take 1 tablet (4 mg total) by mouth every 8 (eight) hours as needed for nausea or vomiting.   polyethylene glycol (MIRALAX / GLYCOLAX) packet Take 17 g by mouth daily as needed for mild constipation.   polyethylene glycol-electrolytes (NULYTELY) 420 g solution Take 4,000 mLs by mouth once for 1 dose   Probiotic Product (PROBIOTIC DAILY PO) Take 2 capsules by mouth daily with supper. Gummie   [DISCONTINUED] estradiol (ESTRACE) 1 MG tablet TAKE 1 TABLET BY MOUTH DAILY.   ALPRAZolam (XANAX) 0.25 MG tablet Take 1 tablet (0.25 mg total) by mouth daily as needed.   amphetamine-dextroamphetamine (ADDERALL XR) 5 MG 24 hr capsule Take 1 capsule (5 mg total) by mouth daily.   benzonatate (TESSALON) 100 MG capsule Take 1 capsule (100 mg total) by mouth 3 (three) times daily as needed for cough. (Patient not taking: Reported on 09/21/2021)   [DISCONTINUED] ALPRAZolam (XANAX) 0.25 MG tablet Take 1 daily as needed (Patient not taking: Reported on 11/22/2021)   [  DISCONTINUED] amphetamine-dextroamphetamine (ADDERALL XR) 5 MG 24 hr capsule Take 1 daily with breakfast (Patient not taking: Reported on 11/22/2021)   [DISCONTINUED] amphetamine-dextroamphetamine (ADDERALL XR) 5 MG 24 hr capsule Take 1 daily (Patient not taking: Reported on 11/22/2021)   [DISCONTINUED] amphetamine-dextroamphetamine (ADDERALL XR) 5 MG 24 hr capsule Take 1 daily (Patient not taking: Reported on 11/22/2021)   [DISCONTINUED] Melatonin ([employer] MELATONIN) 5 MG SUBL Take 1 at bedtime as needed (Patient not  taking: Reported on 11/22/2021)   No facility-administered encounter medications on file as of 11/22/2021.

## 2021-11-22 NOTE — Patient Instructions (Signed)
We are changing your hormone therapy to "the patch"  Start exercising  You are getting "the hump in your upper back!  Work on it!

## 2021-11-23 DIAGNOSIS — D649 Anemia, unspecified: Secondary | ICD-10-CM | POA: Insufficient documentation

## 2021-11-23 NOTE — Assessment & Plan Note (Signed)
she reports compliance with medication regimen    She is not using NSAIDs daily.   She has been checking her BP at home using a calibrated machine and reports that her most recent readings have been over 130/80  But< 150/80 . Continue use of amlodipine 2.5 mg daily

## 2021-11-23 NOTE — Assessment & Plan Note (Addendum)
Estrogen weaning was done at  last visit. But she has become plagued wit hot flashes discussed resuming HT with estrogen patch .  She is s/p hysterectomy

## 2021-11-23 NOTE — Addendum Note (Signed)
Addended by: Crecencio Mc on: 11/23/2021 09:37 PM   Modules accepted: Orders, Level of Service

## 2021-11-23 NOTE — Assessment & Plan Note (Signed)

## 2021-11-23 NOTE — Assessment & Plan Note (Signed)
Reviewed prior cardiology workup

## 2021-12-11 ENCOUNTER — Other Ambulatory Visit: Payer: Self-pay | Admitting: Internal Medicine

## 2021-12-11 ENCOUNTER — Other Ambulatory Visit: Payer: Self-pay

## 2021-12-11 MED ORDER — AMLODIPINE BESYLATE 2.5 MG PO TABS
ORAL_TABLET | Freq: Every day | ORAL | 5 refills | Status: DC
  Filled 2021-12-11: qty 90, 90d supply, fill #0
  Filled 2022-03-08: qty 90, 90d supply, fill #1

## 2021-12-12 ENCOUNTER — Other Ambulatory Visit: Payer: Self-pay

## 2021-12-20 ENCOUNTER — Other Ambulatory Visit: Payer: Self-pay

## 2021-12-24 ENCOUNTER — Other Ambulatory Visit (INDEPENDENT_AMBULATORY_CARE_PROVIDER_SITE_OTHER): Payer: No Typology Code available for payment source

## 2021-12-24 DIAGNOSIS — D649 Anemia, unspecified: Secondary | ICD-10-CM | POA: Diagnosis not present

## 2021-12-24 LAB — CBC WITH DIFFERENTIAL/PLATELET
Basophils Absolute: 0 10*3/uL (ref 0.0–0.1)
Basophils Relative: 0.3 % (ref 0.0–3.0)
Eosinophils Absolute: 0 10*3/uL (ref 0.0–0.7)
Eosinophils Relative: 0.8 % (ref 0.0–5.0)
HCT: 37.5 % (ref 36.0–46.0)
Hemoglobin: 12.8 g/dL (ref 12.0–15.0)
Lymphocytes Relative: 33.3 % (ref 12.0–46.0)
Lymphs Abs: 1.7 10*3/uL (ref 0.7–4.0)
MCHC: 34 g/dL (ref 30.0–36.0)
MCV: 88.5 fl (ref 78.0–100.0)
Monocytes Absolute: 0.4 10*3/uL (ref 0.1–1.0)
Monocytes Relative: 7.7 % (ref 3.0–12.0)
Neutro Abs: 3 10*3/uL (ref 1.4–7.7)
Neutrophils Relative %: 57.9 % (ref 43.0–77.0)
Platelets: 308 10*3/uL (ref 150.0–400.0)
RBC: 4.24 Mil/uL (ref 3.87–5.11)
RDW: 12.7 % (ref 11.5–15.5)
WBC: 5.1 10*3/uL (ref 4.0–10.5)

## 2021-12-24 LAB — B12 AND FOLATE PANEL
Folate: 11.5 ng/mL (ref >5.9)
Vitamin B-12: 249 pg/mL (ref 211–911)

## 2021-12-25 ENCOUNTER — Encounter: Payer: Self-pay | Admitting: Internal Medicine

## 2021-12-25 LAB — IRON,TIBC AND FERRITIN PANEL
%SAT: 23 % (calc) (ref 16–45)
Ferritin: 43 ng/mL (ref 16–232)
Iron: 77 ug/dL (ref 40–190)
TIBC: 340 mcg/dL (calc) (ref 250–450)

## 2021-12-26 ENCOUNTER — Encounter: Payer: Self-pay | Admitting: Internal Medicine

## 2021-12-26 ENCOUNTER — Other Ambulatory Visit: Payer: Self-pay | Admitting: Internal Medicine

## 2021-12-26 DIAGNOSIS — E538 Deficiency of other specified B group vitamins: Secondary | ICD-10-CM | POA: Insufficient documentation

## 2021-12-27 ENCOUNTER — Ambulatory Visit (INDEPENDENT_AMBULATORY_CARE_PROVIDER_SITE_OTHER): Payer: No Typology Code available for payment source

## 2021-12-27 DIAGNOSIS — E538 Deficiency of other specified B group vitamins: Secondary | ICD-10-CM

## 2021-12-27 MED ORDER — CYANOCOBALAMIN 1000 MCG/ML IJ SOLN
1000.0000 ug | Freq: Once | INTRAMUSCULAR | Status: AC
  Administered 2021-12-27: 1000 ug via INTRAMUSCULAR

## 2021-12-27 NOTE — Progress Notes (Signed)
Pt presented for their vitamin B12 injection. Pt was identified through two identifiers. Pt tolerated shot well in their left  deltoid.  

## 2021-12-31 LAB — INTRINSIC FACTOR ANTIBODIES: Intrinsic Factor: NEGATIVE

## 2022-01-03 ENCOUNTER — Ambulatory Visit (INDEPENDENT_AMBULATORY_CARE_PROVIDER_SITE_OTHER): Payer: No Typology Code available for payment source

## 2022-01-03 DIAGNOSIS — E538 Deficiency of other specified B group vitamins: Secondary | ICD-10-CM | POA: Diagnosis not present

## 2022-01-03 MED ORDER — CYANOCOBALAMIN 1000 MCG/ML IJ SOLN
1000.0000 ug | Freq: Once | INTRAMUSCULAR | Status: AC
  Administered 2022-01-03: 1000 ug via INTRAMUSCULAR

## 2022-01-03 NOTE — Progress Notes (Signed)
Pt presented for their vitamin B12 injection. Pt was identified through two identifiers. Pt tolerated shot well in their right deltoid.  

## 2022-01-08 ENCOUNTER — Encounter: Payer: Self-pay | Admitting: Family Medicine

## 2022-01-10 ENCOUNTER — Ambulatory Visit: Payer: No Typology Code available for payment source

## 2022-01-17 ENCOUNTER — Other Ambulatory Visit: Payer: Self-pay

## 2022-01-17 ENCOUNTER — Ambulatory Visit (INDEPENDENT_AMBULATORY_CARE_PROVIDER_SITE_OTHER): Payer: 59

## 2022-01-17 DIAGNOSIS — E538 Deficiency of other specified B group vitamins: Secondary | ICD-10-CM | POA: Diagnosis not present

## 2022-01-17 MED ORDER — CYANOCOBALAMIN 1000 MCG/ML IJ SOLN
1000.0000 ug | Freq: Once | INTRAMUSCULAR | 0 refills | Status: DC
  Filled 2022-01-17: qty 1, 1d supply, fill #0

## 2022-01-17 NOTE — Progress Notes (Signed)
Patient was administered a B12 injection in her Left Arm. Patient tolerated B12 injection well. Patient did not show signs of distress or voice any concerns.

## 2022-01-21 ENCOUNTER — Telehealth: Payer: Self-pay | Admitting: Internal Medicine

## 2022-01-21 ENCOUNTER — Other Ambulatory Visit: Payer: Self-pay

## 2022-01-22 ENCOUNTER — Encounter: Payer: Self-pay | Admitting: Internal Medicine

## 2022-01-22 DIAGNOSIS — E538 Deficiency of other specified B group vitamins: Secondary | ICD-10-CM | POA: Diagnosis not present

## 2022-01-22 MED ORDER — CYANOCOBALAMIN 1000 MCG/ML IJ SOLN
1000.0000 ug | Freq: Once | INTRAMUSCULAR | Status: AC
  Administered 2022-01-22: 1000 ug via INTRAMUSCULAR

## 2022-01-22 NOTE — Addendum Note (Signed)
Addended by: Jeralyn Bennett A on: 01/22/2022 10:28 AM   Modules accepted: Orders

## 2022-01-24 ENCOUNTER — Ambulatory Visit: Payer: No Typology Code available for payment source

## 2022-01-25 ENCOUNTER — Other Ambulatory Visit: Payer: Self-pay

## 2022-01-25 DIAGNOSIS — F411 Generalized anxiety disorder: Secondary | ICD-10-CM | POA: Diagnosis not present

## 2022-01-25 DIAGNOSIS — F330 Major depressive disorder, recurrent, mild: Secondary | ICD-10-CM | POA: Diagnosis not present

## 2022-01-25 DIAGNOSIS — F4001 Agoraphobia with panic disorder: Secondary | ICD-10-CM | POA: Diagnosis not present

## 2022-01-25 DIAGNOSIS — F4312 Post-traumatic stress disorder, chronic: Secondary | ICD-10-CM | POA: Diagnosis not present

## 2022-01-25 MED ORDER — CITALOPRAM HYDROBROMIDE 10 MG PO TABS
15.0000 mg | ORAL_TABLET | Freq: Every day | ORAL | 0 refills | Status: DC
  Filled 2022-01-25: qty 135, 90d supply, fill #0

## 2022-01-25 MED ORDER — ALPRAZOLAM 0.25 MG PO TABS
0.2500 mg | ORAL_TABLET | Freq: Every day | ORAL | 2 refills | Status: DC | PRN
  Filled 2022-01-25: qty 8, 8d supply, fill #0

## 2022-01-25 MED ORDER — AMPHETAMINE-DEXTROAMPHET ER 5 MG PO CP24
5.0000 mg | ORAL_CAPSULE | Freq: Every day | ORAL | 0 refills | Status: DC
  Filled 2022-01-25: qty 90, 90d supply, fill #0

## 2022-01-28 ENCOUNTER — Other Ambulatory Visit: Payer: Self-pay

## 2022-02-05 ENCOUNTER — Other Ambulatory Visit: Payer: Self-pay

## 2022-02-05 ENCOUNTER — Ambulatory Visit: Payer: 59 | Admitting: Anesthesiology

## 2022-02-05 ENCOUNTER — Ambulatory Visit
Admission: RE | Admit: 2022-02-05 | Discharge: 2022-02-05 | Disposition: A | Discharge status: Home / Self Care | Payer: 59 | Attending: Gastroenterology | Admitting: Gastroenterology

## 2022-02-05 ENCOUNTER — Encounter
Admission: RE | Disposition: A | Discharge status: Home / Self Care | Payer: Self-pay | Source: Home / Self Care | Attending: Gastroenterology

## 2022-02-05 DIAGNOSIS — Z90710 Acquired absence of both cervix and uterus: Secondary | ICD-10-CM | POA: Diagnosis not present

## 2022-02-05 DIAGNOSIS — K219 Gastro-esophageal reflux disease without esophagitis: Secondary | ICD-10-CM | POA: Diagnosis not present

## 2022-02-05 DIAGNOSIS — K641 Second degree hemorrhoids: Secondary | ICD-10-CM | POA: Insufficient documentation

## 2022-02-05 DIAGNOSIS — R194 Change in bowel habit: Secondary | ICD-10-CM | POA: Diagnosis not present

## 2022-02-05 DIAGNOSIS — I509 Heart failure, unspecified: Secondary | ICD-10-CM | POA: Insufficient documentation

## 2022-02-05 DIAGNOSIS — K635 Polyp of colon: Secondary | ICD-10-CM | POA: Diagnosis not present

## 2022-02-05 DIAGNOSIS — D124 Benign neoplasm of descending colon: Secondary | ICD-10-CM | POA: Diagnosis not present

## 2022-02-05 DIAGNOSIS — K589 Irritable bowel syndrome without diarrhea: Secondary | ICD-10-CM | POA: Insufficient documentation

## 2022-02-05 DIAGNOSIS — I110 Hypertensive heart disease with heart failure: Secondary | ICD-10-CM | POA: Diagnosis not present

## 2022-02-05 HISTORY — PX: COLONOSCOPY WITH PROPOFOL: SHX5780

## 2022-02-05 SURGERY — COLONOSCOPY WITH PROPOFOL
Anesthesia: General

## 2022-02-05 MED ORDER — PROPOFOL 10 MG/ML IV BOLUS
INTRAVENOUS | Status: DC | PRN
  Administered 2022-02-05: 70 mg via INTRAVENOUS
  Administered 2022-02-05 (×2): 30 mg via INTRAVENOUS

## 2022-02-05 MED ORDER — STERILE WATER FOR IRRIGATION IR SOLN
Status: DC | PRN
  Administered 2022-02-05: 60 mL

## 2022-02-05 MED ORDER — DEXMEDETOMIDINE HCL IN NACL 200 MCG/50ML IV SOLN
INTRAVENOUS | Status: DC | PRN
  Administered 2022-02-05 (×2): 12 ug via INTRAVENOUS

## 2022-02-05 MED ORDER — SODIUM CHLORIDE 0.9 % IV SOLN: INTRAVENOUS | Status: DC

## 2022-02-05 MED ORDER — LIDOCAINE HCL (CARDIAC) PF 100 MG/5ML IV SOSY
PREFILLED_SYRINGE | INTRAVENOUS | Status: DC | PRN
  Administered 2022-02-05: 100 mg via INTRAVENOUS

## 2022-02-05 MED ORDER — PROPOFOL 500 MG/50ML IV EMUL
INTRAVENOUS | Status: DC | PRN
  Administered 2022-02-05: 165 ug/kg/min via INTRAVENOUS

## 2022-02-05 NOTE — Anesthesia Postprocedure Evaluation (Signed)
Anesthesia Post Note  Patient: Kolina Kube  Procedure(s) Performed: COLONOSCOPY WITH PROPOFOL  Patient location during evaluation: PACU Anesthesia Type: General Level of consciousness: awake and alert Pain management: pain level controlled Vital Signs Assessment: post-procedure vital signs reviewed and stable Respiratory status: spontaneous breathing, nonlabored ventilation, respiratory function stable and patient connected to nasal cannula oxygen Cardiovascular status: blood pressure returned to baseline and stable Postop Assessment: no apparent nausea or vomiting Anesthetic complications: no   No notable events documented.   Last Vitals:  Vitals:   02/05/22 1230 02/05/22 1300  BP: (!) 127/109 97/66  Pulse: 65   Resp: (!) 21   Temp: 36.5 C   SpO2: 100% 100%    Last Pain:  Vitals:   02/05/22 1300  TempSrc:   PainSc: 0-No pain                 Arita Miss

## 2022-02-05 NOTE — Transfer of Care (Signed)
Immediate Anesthesia Transfer of Care Note  Patient: Connie Francis  Procedure(s) Performed: COLONOSCOPY WITH PROPOFOL  Patient Location: Endoscopy Unit  Anesthesia Type:General  Level of Consciousness: awake, drowsy, and patient cooperative  Airway & Oxygen Therapy: Patient Spontanous Breathing and Patient connected to face mask oxygen  Post-op Assessment: Report given to RN and Post -op Vital signs reviewed and stable  Post vital signs: Reviewed and stable  Last Vitals:  Vitals Value Taken Time  BP 127/109 02/05/22 1231  Temp 36.5 C 02/05/22 1230  Pulse 60 02/05/22 1235  Resp 18 02/05/22 1235  SpO2 100 % 02/05/22 1235  Vitals shown include unvalidated device data.  Last Pain:  Vitals:   02/05/22 1230  TempSrc: Temporal  PainSc: Asleep         Complications: No notable events documented.

## 2022-02-05 NOTE — Op Note (Signed)
[facility] Patient Name: Connie Francis Procedure Date: 02/05/2022 11:25 AM MRN: [id number] Account #: [id number] Date of Birth: 1970/03/30 Admit Type: Outpatient Age: 52 Room: [facility] ENDO ROOM 1 Gender: Female Note Status: Finalized Instrument Name: Jasper Riling [id number] Procedure:             Colonoscopy Indications:           Change in bowel habits Providers:             Andrey Farmer MD, MD Referring MD:          Deborra Medina, MD (Referring MD) Medicines:             Monitored Anesthesia Care Complications:         No immediate complications. Estimated blood loss:                         Minimal. Procedure:             Pre-Anesthesia Assessment:                        - Prior to the procedure, a History and Physical was                         performed, and patient medications and allergies were                         reviewed. The patient is competent. The risks and                         benefits of the procedure and the sedation options and                         risks were discussed with the patient. All questions                         were answered and informed consent was obtained.                         Patient identification and proposed procedure were                         verified by the physician, the nurse, the                         anesthesiologist, the anesthetist and the technician                         in the endoscopy suite. Mental Status Examination:                         alert and oriented. Airway Examination: normal                         oropharyngeal airway and neck mobility. Respiratory                         Examination: clear to auscultation. CV Examination:  normal. Prophylactic Antibiotics: The patient does not                         require prophylactic antibiotics. Prior                         Anticoagulants: The patient has taken no anticoagulant                         or  antiplatelet agents. ASA Grade Assessment: II - A                         patient with mild systemic disease. After reviewing                         the risks and benefits, the patient was deemed in                         satisfactory condition to undergo the procedure. The                         anesthesia plan was to use monitored anesthesia care                         (MAC). Immediately prior to administration of                         medications, the patient was re-assessed for adequacy                         to receive sedatives. The heart rate, respiratory                         rate, oxygen saturations, blood pressure, adequacy of                         pulmonary ventilation, and response to care were                         monitored throughout the procedure. The physical                         status of the patient was re-assessed after the                         procedure.                        After obtaining informed consent, the colonoscope was                         passed under direct vision. Throughout the procedure,                         the patient's blood pressure, pulse, and oxygen                         saturations were monitored continuously. The  Colonoscope was introduced through the anus and                         advanced to the the terminal ileum. The colonoscopy                         was somewhat difficult due to restricted mobility of                         the colon. The patient tolerated the procedure well.                         The quality of the bowel preparation was good. The                         terminal ileum, ileocecal valve, appendiceal orifice,                         and rectum were photographed. Findings:      The perianal and digital rectal examinations were normal.      The terminal ileum appeared normal.      A 5 mm polyp was found in the descending colon. The polyp was sessile.       The polyp was  removed with a cold snare. Resection and retrieval were       complete. Estimated blood loss was minimal.      Internal hemorrhoids were found during retroflexion. The hemorrhoids       were Grade II (internal hemorrhoids that prolapse but reduce       spontaneously).      The exam was otherwise without abnormality on direct and retroflexion       views. Impression:            - The examined portion of the ileum was normal.                        - One 5 mm polyp in the descending colon, removed with                         a cold snare. Resected and retrieved.                        - Internal hemorrhoids.                        - The examination was otherwise normal on direct and                         retroflexion views. Recommendation:        - Discharge patient to home.                        - Resume previous diet.                        - Continue present medications.                        - Await pathology results.                        -  Repeat colonoscopy for surveillance based on                         pathology results.                        - Return to referring physician as previously                         scheduled. Procedure Code(s):     --- Professional ---                        [phone], Colonoscopy, flexible; with removal of                         tumor(s), polyp(s), or other lesion(s) by snare                         technique Diagnosis Code(s):     --- Professional ---                        K64.1, Second degree hemorrhoids                        D12.4, Benign neoplasm of descending colon                        R19.4, Change in bowel habit CPT copyright 2022 American [employer] Association. All rights reserved. The codes documented in this report are preliminary and upon coder review may  be revised to meet current compliance requirements. Andrey Farmer MD, MD 02/05/2022 12:31:31 PM Number of Addenda: 0 Note Initiated On: 02/05/2022 11:25 AM Scope  Withdrawal Time: 0 hours 9 minutes 12 seconds  Total Procedure Duration: 0 hours 16 minutes 6 seconds  Estimated Blood Loss:  Estimated blood loss was minimal.      [facility]

## 2022-02-05 NOTE — Anesthesia Preprocedure Evaluation (Signed)
Anesthesia Evaluation  Patient identified by MRN, date of birth, ID band Patient awake    Reviewed: Allergy & Precautions, NPO status , Patient's Chart, lab work & pertinent test results  History of Anesthesia Complications Negative for: history of anesthetic complications  Airway Mallampati: II  TM Distance: >3 FB Neck ROM: Full    Dental  (+) Teeth Intact, Dental Advisory Given   Pulmonary neg pulmonary ROS, neg sleep apnea, neg COPD, Patient abstained from smoking.Not current smoker   breath sounds clear to auscultation       Cardiovascular Exercise Tolerance: Good METShypertension, (-) CAD and (-) Past MI (-) dysrhythmias  Rhythm:Regular Rate:Normal     Neuro/Psych  Headaches PSYCHIATRIC DISORDERS Anxiety Depression       GI/Hepatic ,GERD  Controlled,,(+)     (-) substance abuse    Endo/Other  neg diabetes    Renal/GU negative Renal ROS     Musculoskeletal   Abdominal   Peds  Hematology   Anesthesia Other Findings Past Medical History: No date: Abdominal bloating No date: Anxiety 09/13/2010: Cardiomyopathy in the puerperium 2006: CHF (congestive heart failure) (HCC) No date: Chronic epigastric pain No date: Constipation No date: GERD (gastroesophageal reflux disease) 2017: Hiatal hernia No date: Hypertension No date: IBS (irritable bowel syndrome) No date: Kidney stones No date: Lump or mass in breast     Comment:  R-Breast 10/05/2017: Microscopic hematuria No date: Migraine No date: Obsessive compulsive disorder 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 No date: Thyroid disease     Comment:  hyper  Reproductive/Obstetrics                              Anesthesia Physical Anesthesia Plan  ASA: 2  Anesthesia Plan: General   Post-op Pain Management: Minimal or no pain anticipated   Induction: Intravenous  PONV Risk Score and Plan: 3 and  TIVA and Ondansetron  Airway Management Planned: Natural Airway  Additional Equipment: None  Intra-op Plan:   Post-operative Plan:   Informed Consent: I have reviewed the patients History and Physical, chart, labs and discussed the procedure including the risks, benefits and alternatives for the proposed anesthesia with the patient or authorized representative who has indicated his/her understanding and acceptance.     Dental advisory given  Plan Discussed with: CRNA and Anesthesiologist  Anesthesia Plan Comments: (Discussed risks of anesthesia with patient, including possibility of difficulty with spontaneous ventilation under anesthesia necessitating airway intervention, PONV, and rare risks such as cardiac or respiratory or neurological events, and allergic reactions. Discussed the role of CRNA in patient's perioperative care. Patient understands.)         Anesthesia Quick Evaluation

## 2022-02-05 NOTE — Interval H&P Note (Signed)
History and Physical Interval Note:  02/05/2022 12:01 PM  Connie Francis  has presented today for surgery, with the diagnosis of BOWEL HABIT CHANGES.  The various methods of treatment have been discussed with the patient and family. After consideration of risks, benefits and other options for treatment, the patient has consented to  Procedure(s): COLONOSCOPY WITH PROPOFOL (N/A) as a surgical intervention.  The patient's history has been reviewed, patient examined, no change in status, stable for surgery.  I have reviewed the patient's chart and labs.  Questions were answered to the patient's satisfaction.     Lesly Rubenstein  Ok to proceed with colonoscopy

## 2022-02-05 NOTE — Anesthesia Procedure Notes (Signed)
Procedure Name: General with mask airway Date/Time: 02/05/2022 12:24 PM  Performed by: Kelton Pillar, CRNAPre-anesthesia Checklist: Patient identified, Emergency Drugs available, Suction available and Patient being monitored Patient Re-evaluated:Patient Re-evaluated prior to induction Oxygen Delivery Method: Simple face mask Induction Type: IV induction Placement Confirmation: positive ETCO2, CO2 detector and breath sounds checked- equal and bilateral Dental Injury: Teeth and Oropharynx as per pre-operative assessment

## 2022-02-05 NOTE — H&P (Signed)
Outpatient short stay form Pre-procedure 02/05/2022  Connie Rubenstein, MD  Primary Physician: Crecencio Mc, MD  Reason for visit:  Bowel habit changes  History of present illness:    52 y/o lady with history of IBS, ADHD, and anxiety here for colonoscopy. Last colonoscopy in 2016 was unremarkable. No blood thinners. History of hysterectomy. No family history of GI malignancies.    Current Facility-Administered Medications:    0.9 %  sodium chloride infusion, , Intravenous, Continuous, Chesky Heyer, Hilton Cork, MD, Last Rate: 20 mL/hr at 02/05/22 1139, New Bag at 02/05/22 1139  Medications Prior to Admission  Medication Sig Dispense Refill Last Dose   ALPRAZolam (XANAX) 0.25 MG tablet Take 1 tablet (0.25 mg total) by mouth daily as needed. 8 tablet 2 Past Week   ALPRAZolam (XANAX) 0.25 MG tablet Take 1 tablet (0.25 mg total) by mouth daily as needed. 8 tablet 2 Past Week   amLODipine (NORVASC) 2.5 MG tablet TAKE 1 TABLET (2.5 MG TOTAL) BY MOUTH DAILY. 30 tablet 5 02/04/2022 at 2200   amphetamine-dextroamphetamine (ADDERALL XR) 5 MG 24 hr capsule Take 1 capsule (5 mg total) by mouth daily. 90 capsule 0 Past Week   benzonatate (TESSALON) 100 MG capsule Take 1 capsule (100 mg total) by mouth 3 (three) times daily as needed for cough. 30 capsule 0 Past Week   citalopram (CELEXA) 10 MG tablet Take 1.5 tablets (15 mg total) by mouth daily. 135 tablet 0 02/04/2022   citalopram (CELEXA) 10 MG tablet Take 1.5 tablets (15 mg total) by mouth daily. 135 tablet 0 02/04/2022 at 2200   estradiol (VIVELLE-DOT) 0.1 MG/24HR patch Place 1 patch (0.1 mg total) onto the skin 2 (two) times a week. 8 patch 12 02/04/2022   fluconazole (DIFLUCAN) 150 MG tablet Take 1 tablet (150 mg total) by mouth daily. 2 tablet 0 Past Week   hyoscyamine (ANASPAZ) 0.125 MG TBDP disintergrating tablet Place 1 tablet (0.125 mg total) under the tongue every 6 (six) hours as needed. For abdominal /esophageal pain 30 tablet 0 Past Week    Melatonin ([employer] MELATONIN) 5 MG SUBL Take 1 at bedtime as needed 90 tablet 0 Past Week   ondansetron (ZOFRAN) 4 MG tablet Take 1 tablet (4 mg total) by mouth every 8 (eight) hours as needed for nausea or vomiting. 20 tablet 0 Past Week   polyethylene glycol (MIRALAX / GLYCOLAX) packet Take 17 g by mouth daily as needed for mild constipation.   Past Week   polyethylene glycol-electrolytes (NULYTELY) 420 g solution Take 4,000 mLs by mouth once for 1 dose 4000 mL 0 Past Week   Probiotic Product (PROBIOTIC DAILY PO) Take 2 capsules by mouth daily with supper. 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    Physical Exam  Gen: Alert, oriented. Appears stated age.  HEENT: PERRLA. Lungs: No respiratory distress CV: RRR Abd: soft, benign, no masses Ext: No edema    Planned procedures: Proceed with colonoscopy. The patient understands the nature of the planned procedure, indications, risks, alternatives and potential complications including but not limited to bleeding, infection, perforation, damage to internal organs and possible oversedation/side  effects from anesthesia. The patient agrees and gives consent to proceed.  Please refer to procedure notes for findings, recommendations and patient disposition/instructions.     Connie Rubenstein, MD [facility]astroenterology

## 2022-02-06 ENCOUNTER — Encounter: Payer: Self-pay | Admitting: Gastroenterology

## 2022-02-06 LAB — SURGICAL PATHOLOGY

## 2022-02-28 ENCOUNTER — Ambulatory Visit
Admission: RE | Admit: 2022-02-28 | Discharge: 2022-02-28 | Disposition: A | Discharge status: Home / Self Care | Payer: 59 | Source: Ambulatory Visit | Attending: Internal Medicine | Admitting: Internal Medicine

## 2022-02-28 DIAGNOSIS — Z1231 Encounter for screening mammogram for malignant neoplasm of breast: Secondary | ICD-10-CM | POA: Insufficient documentation

## 2022-03-04 ENCOUNTER — Other Ambulatory Visit: Payer: Self-pay | Admitting: Internal Medicine

## 2022-03-04 DIAGNOSIS — R928 Other abnormal and inconclusive findings on diagnostic imaging of breast: Secondary | ICD-10-CM

## 2022-03-04 DIAGNOSIS — R921 Mammographic calcification found on diagnostic imaging of breast: Secondary | ICD-10-CM

## 2022-03-04 DIAGNOSIS — N6489 Other specified disorders of breast: Secondary | ICD-10-CM

## 2022-03-05 ENCOUNTER — Encounter: Payer: Self-pay | Admitting: Internal Medicine

## 2022-03-12 ENCOUNTER — Ambulatory Visit
Admission: RE | Admit: 2022-03-12 | Discharge: 2022-03-12 | Disposition: A | Discharge status: Home / Self Care | Payer: 59 | Source: Ambulatory Visit | Attending: Internal Medicine | Admitting: Internal Medicine

## 2022-03-12 DIAGNOSIS — N6489 Other specified disorders of breast: Secondary | ICD-10-CM | POA: Diagnosis not present

## 2022-03-12 DIAGNOSIS — R921 Mammographic calcification found on diagnostic imaging of breast: Secondary | ICD-10-CM

## 2022-03-12 DIAGNOSIS — N6002 Solitary cyst of left breast: Secondary | ICD-10-CM | POA: Diagnosis not present

## 2022-03-12 DIAGNOSIS — R928 Other abnormal and inconclusive findings on diagnostic imaging of breast: Secondary | ICD-10-CM

## 2022-03-18 ENCOUNTER — Encounter: Payer: Self-pay | Admitting: Internal Medicine

## 2022-03-19 ENCOUNTER — Other Ambulatory Visit: Payer: Self-pay

## 2022-03-19 ENCOUNTER — Other Ambulatory Visit: Payer: Self-pay | Admitting: Internal Medicine

## 2022-03-19 MED ORDER — ESTRADIOL 0.075 MG/24HR TD PTTW
1.0000 | MEDICATED_PATCH | TRANSDERMAL | 0 refills | Status: DC
  Filled 2022-03-19 – 2022-03-20 (×2): qty 8, 28d supply, fill #0

## 2022-03-20 ENCOUNTER — Other Ambulatory Visit: Payer: Self-pay

## 2022-04-11 ENCOUNTER — Encounter: Payer: Self-pay | Admitting: Internal Medicine

## 2022-04-11 ENCOUNTER — Other Ambulatory Visit: Payer: Self-pay | Admitting: Internal Medicine

## 2022-04-11 ENCOUNTER — Other Ambulatory Visit: Payer: Self-pay

## 2022-04-11 MED FILL — Estradiol TD Patch Twice Weekly 0.075 MG/24HR: TRANSDERMAL | 28 days supply | Qty: 8 | Fill #0 | Status: AC

## 2022-04-14 MED ORDER — ESTRADIOL 0.05 MG/24HR TD PTTW
1.0000 | MEDICATED_PATCH | TRANSDERMAL | 0 refills | Status: DC
  Filled 2022-04-14: qty 8, 28d supply, fill #0

## 2022-04-15 ENCOUNTER — Other Ambulatory Visit: Payer: Self-pay

## 2022-04-23 ENCOUNTER — Encounter: Payer: Self-pay | Admitting: Nurse Practitioner

## 2022-04-23 ENCOUNTER — Other Ambulatory Visit: Payer: Self-pay

## 2022-04-23 ENCOUNTER — Ambulatory Visit (INDEPENDENT_AMBULATORY_CARE_PROVIDER_SITE_OTHER): Payer: 59 | Admitting: Nurse Practitioner

## 2022-04-23 VITALS — BP 128/70 | HR 87 | Temp 98.5°F | Ht 62.0 in | Wt 139.2 lb

## 2022-04-23 DIAGNOSIS — J0100 Acute maxillary sinusitis, unspecified: Secondary | ICD-10-CM | POA: Diagnosis not present

## 2022-04-23 MED ORDER — DOXYCYCLINE HYCLATE 100 MG PO TABS
100.0000 mg | ORAL_TABLET | Freq: Two times a day (BID) | ORAL | 0 refills | Status: AC
  Filled 2022-04-23: qty 14, 7d supply, fill #0

## 2022-04-23 MED ORDER — PREDNISONE 20 MG PO TABS
20.0000 mg | ORAL_TABLET | Freq: Every day | ORAL | 0 refills | Status: DC
  Filled 2022-04-23: qty 5, 5d supply, fill #0

## 2022-04-23 NOTE — Progress Notes (Signed)
Established Patient Office Visit  Subjective:  Patient ID: Connie Francis, female    DOB: 01/22/1970  Age: 52 y.o. MRN: [id number]  CC:  Chief Complaint  Patient presents with   Sinus Problem    Since Friday 4/5    HPI  Connie Francis presents for   Sinus Problem Associated symptoms include congestion, coughing and sinus pressure. Pertinent negatives include no sore throat.     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   Psychogenic dyspepsia 11/27/2012   Thyroid disease    hyper    Past Surgical History:  Procedure Laterality Date   BREAST CYST ASPIRATION Left    negative 2010   BREAST REDUCTION SURGERY Bilateral 05/15/2020   Procedure: BREAST REDUCTION WITH LIPOSUCTION;  Surgeon: Peggye Form, DO;  Location: [facility] OR;  Service: Plastics;  Laterality: Bilateral;   CESAREAN SECTION     COLONOSCOPY WITH PROPOFOL N/A 07/27/2014   Procedure: COLONOSCOPY WITH PROPOFOL;  Surgeon: Midge Minium, MD;  Location: [facility];  Service: Endoscopy;  Laterality: N/A;   COLONOSCOPY WITH PROPOFOL N/A 02/05/2022   Procedure: COLONOSCOPY WITH PROPOFOL;  Surgeon: Regis Bill, MD;  Location: [facility] ENDOSCOPY;  Service: Endoscopy;  Laterality: N/A;   colonscopy  07/27/2015   DILATION AND CURETTAGE OF UTERUS  2006   complicated by heart failure   ESOPHAGOGASTRODUODENOSCOPY (EGD) WITH PROPOFOL N/A 07/27/2014   Procedure: ESOPHAGOGASTRODUODENOSCOPY (EGD) WITH PROPOFOL;  Surgeon: Midge Minium, MD;  Location: [facility];  Service: Endoscopy;  Laterality: N/A;   TUBAL LIGATION     UPPER ENDOSCOPY W/ ESOPHAGEAL MANOMETRY     VAGINAL HYSTERECTOMY      Family History  Problem Relation Age of Onset   Hypertension Mother    Breast cancer Maternal Aunt    Cancer Maternal Aunt        parotid Ca   Stroke Paternal Aunt    Cancer Paternal Uncle        lung, nasopharnygeal   Hearing loss Maternal Grandmother    Alcohol abuse Maternal Grandmother    Arthritis Maternal Grandfather    Esophageal cancer Cousin 57   Colon cancer Neg Hx    Liver disease Neg Hx    Ovarian cancer Neg Hx    Diabetes Neg Hx     Social History   Socioeconomic History   Marital status: Married    Spouse name: Not on file   Number of children: Not on file   Years of education: Not on file   Highest education level: Not on file  Occupational History   Not on file  Tobacco Use   Smoking status: Never   Smokeless tobacco: Never  Vaping Use   Vaping Use: Never used  Substance and Sexual Activity   Alcohol use: Not Currently    Alcohol/week: 1.0 standard drink of alcohol    Types: 1 Glasses of wine per week    Comment: Rare   Drug use:  Never   Sexual activity: Yes    Birth control/protection: Surgical  Other Topics Concern   Not on file  Social History Narrative   Not on file   Social Determinants of Health   Financial Resource Strain: Not on file  Food Insecurity: Not on file  Transportation Needs: Not on file  Physical Activity: Insufficiently Active (12/17/2016)   Exercise Vital Sign    Days of Exercise per Week: 2 days    Minutes of Exercise per Session: 20 min  Stress: Not on file  Social Connections: Not on file  Intimate Partner Violence: Not At Risk (12/17/2016)   Humiliation, Afraid, Rape, and Kick questionnaire    Fear of Current or Ex-Partner: No    Emotionally Abused: No    Physically Abused: No    Sexually Abused: No     Outpatient Medications Prior to Visit  Medication Sig Dispense Refill    ALPRAZolam (XANAX) 0.25 MG tablet Take 1 tablet (0.25 mg total) by mouth daily as needed. 8 tablet 2   ALPRAZolam (XANAX) 0.25 MG tablet Take 1 tablet (0.25 mg total) by mouth daily as needed. 8 tablet 2   amLODipine (NORVASC) 2.5 MG tablet TAKE 1 TABLET (2.5 MG TOTAL) BY MOUTH DAILY. 30 tablet 5   citalopram (CELEXA) 10 MG tablet Take 1.5 tablets (15 mg total) by mouth daily. 135 tablet 0   estradiol (VIVELLE-DOT) 0.05 MG/24HR patch Place 1 patch (0.05 mg total) onto the skin 2 (two) times a week. 8 patch 0   fluconazole (DIFLUCAN) 150 MG tablet Take 1 tablet (150 mg total) by mouth daily. 2 tablet 0   ondansetron (ZOFRAN) 4 MG tablet Take 1 tablet (4 mg total) by mouth every 8 (eight) hours as needed for nausea or vomiting. 20 tablet 0   polyethylene glycol (MIRALAX / GLYCOLAX) packet Take 17 g by mouth daily as needed for mild constipation.     Probiotic Product (PROBIOTIC DAILY PO) Take 2 capsules by mouth daily with supper. Gummie     amphetamine-dextroamphetamine (ADDERALL XR) 5 MG 24 hr capsule Take 1 capsule (5 mg total) by mouth daily. 90 capsule 0   citalopram (CELEXA) 10 MG tablet Take 1.5 tablets (15 mg total) by mouth daily. 135 tablet 0   benzonatate (TESSALON) 100 MG capsule Take 1 capsule (100 mg total) by mouth 3 (three) times daily as needed for cough. 30 capsule 0   Melatonin ([employer] MELATONIN) 5 MG SUBL Take 1 at bedtime as needed 90 tablet 0   hyoscyamine (ANASPAZ) 0.125 MG TBDP disintergrating tablet Place 1 tablet (0.125 mg total) under the tongue every 6 (six) hours as needed. For abdominal /esophageal pain 30 tablet 0   polyethylene glycol-electrolytes (NULYTELY) 420 g solution Take 4,000 mLs by mouth once for 1 dose 4000 mL 0   No facility-administered medications prior to visit.    Allergies  Allergen Reactions   Latex     Swelling and itching    Lisinopril Cough    cough   Penicillins Swelling    Reaction: 1996   Shellfish Allergy     hives   Sulfa  Antibiotics     Rash and itching    Lamictal [Lamotrigine] Rash    Blisters     ROS Review of Systems  Constitutional: Negative.  Negative for fever.  HENT:  Positive for congestion, postnasal drip and sinus pressure. Negative for sore throat.   Respiratory:  Positive for cough.   Cardiovascular:  Negative for leg swelling.  Neurological: Negative.   Psychiatric/Behavioral: Negative.        Objective:    Physical Exam Constitutional:      Appearance: Normal appearance.  HENT:     Right Ear: Tympanic membrane normal.     Left Ear: Tympanic membrane normal.     Nose:     Right Turbinates: Swollen.     Left Turbinates: Swollen.     Right Sinus: Maxillary sinus tenderness present.     Left Sinus: Maxillary sinus tenderness present.  Neurological:     Mental Status: She is alert.     BP 128/70   Pulse 87   Temp 98.5 F (36.9 C) (Oral)   Ht 5\' 2"  (1.575 m)   Wt 139 lb 3.2 oz (63.1 kg)   SpO2 98%   BMI 25.46 kg/m  Wt Readings from Last 3 Encounters:  04/23/22 139 lb 3.2 oz (63.1 kg)  02/05/22 136 lb (61.7 kg)  11/22/21 138 lb 12.8 oz (63 kg)     Health Maintenance  Topic Date Due   DTaP/Tdap/Td (2 - Td or Tdap) 07/06/2020   COVID-19 Vaccine (4 - 2023-24 season) 09/14/2021   INFLUENZA VACCINE  08/15/2022   COLONOSCOPY (Pts [id number] Insurance coverage will need to be confirmed)  02/06/2032   Hepatitis C Screening  Completed   HPV VACCINES  Aged Out   HIV Screening  Discontinued    There are no preventive care reminders to display for this patient.  Lab Results  Component Value Date   TSH 0.68 11/22/2021   Lab Results  Component Value Date   WBC 5.1 12/24/2021   HGB 12.8 12/24/2021   HCT 37.5 12/24/2021   MCV 88.5 12/24/2021   PLT 308.0 12/24/2021   Lab Results  Component Value Date   NA 138 11/22/2021   K 3.7 11/22/2021   CO2 29 11/22/2021   GLUCOSE 84 11/22/2021   BUN 11 11/22/2021   CREATININE 0.61 11/22/2021   BILITOT 0.3 11/22/2021    ALKPHOS 72 11/22/2021   AST 13 11/22/2021   ALT 9 11/22/2021   PROT 6.7 11/22/2021   ALBUMIN 4.0 11/22/2021   CALCIUM 8.6 11/22/2021   ANIONGAP 6 05/15/2020   GFR 105.08 11/22/2021   Lab Results  Component Value Date   CHOL 205 (H) 11/22/2021   Lab Results  Component Value Date   HDL 73.70 11/22/2021   Lab Results  Component Value Date   LDLCALC 108 (H) 11/22/2021   Lab Results  Component Value Date   TRIG 117.0 11/22/2021   Lab Results  Component Value Date   CHOLHDL 3 11/22/2021   Lab Results  Component Value Date   HGBA1C 5.3 11/22/2021      Assessment & Plan:  Acute non-recurrent maxillary sinusitis  Other orders -     predniSONE; Take 1 tablet (20 mg total) by mouth daily with breakfast.  Dispense: 5 tablet; Refill: 0 -     Doxycycline Hyclate; Take 1 tablet (100 mg total) by mouth 2 (two) times daily for 7 days.  Dispense: 14 tablet; Refill: 0    Follow-up: No follow-ups on file.   Kara Diesharanpreet  Shivangi Lutz, NP

## 2022-04-23 NOTE — Patient Instructions (Signed)
Doxycycline and prednisone sent to pharmacy. Increase hydration , use steam/humidifier. Call the office is symptoms not improving.

## 2022-04-26 ENCOUNTER — Other Ambulatory Visit: Payer: Self-pay

## 2022-04-26 DIAGNOSIS — F4001 Agoraphobia with panic disorder: Secondary | ICD-10-CM | POA: Diagnosis not present

## 2022-04-26 DIAGNOSIS — F411 Generalized anxiety disorder: Secondary | ICD-10-CM | POA: Diagnosis not present

## 2022-04-26 DIAGNOSIS — F4312 Post-traumatic stress disorder, chronic: Secondary | ICD-10-CM | POA: Diagnosis not present

## 2022-04-26 DIAGNOSIS — F330 Major depressive disorder, recurrent, mild: Secondary | ICD-10-CM | POA: Diagnosis not present

## 2022-04-26 MED ORDER — ALPRAZOLAM 0.25 MG PO TABS
0.2500 mg | ORAL_TABLET | Freq: Every day | ORAL | 2 refills | Status: AC | PRN
  Filled 2022-04-26: qty 8, 8d supply, fill #0

## 2022-04-26 MED ORDER — AMPHETAMINE-DEXTROAMPHET ER 5 MG PO CP24
5.0000 mg | ORAL_CAPSULE | Freq: Every day | ORAL | 0 refills | Status: DC
  Filled 2022-04-26: qty 20, 20d supply, fill #0
  Filled 2022-05-21: qty 70, 70d supply, fill #0
  Filled 2022-05-21: qty 20, 20d supply, fill #0
  Filled 2022-05-21: qty 70, 70d supply, fill #0

## 2022-04-26 MED ORDER — CITALOPRAM HYDROBROMIDE 10 MG PO TABS
15.0000 mg | ORAL_TABLET | Freq: Every day | ORAL | 0 refills | Status: DC
  Filled 2022-04-26: qty 135, 90d supply, fill #0

## 2022-04-29 ENCOUNTER — Encounter: Payer: Self-pay | Admitting: Nurse Practitioner

## 2022-04-29 ENCOUNTER — Other Ambulatory Visit: Payer: Self-pay

## 2022-04-29 NOTE — Assessment & Plan Note (Signed)
Doxycycline and prednisone sent to the pharmacy. Advised patient to increase hydration and rest. If symptoms do not improve call the office back for further evaluation.

## 2022-05-01 ENCOUNTER — Other Ambulatory Visit: Payer: Self-pay

## 2022-05-02 ENCOUNTER — Other Ambulatory Visit: Payer: Self-pay

## 2022-05-03 ENCOUNTER — Other Ambulatory Visit: Payer: Self-pay

## 2022-05-07 ENCOUNTER — Other Ambulatory Visit: Payer: Self-pay

## 2022-05-08 ENCOUNTER — Other Ambulatory Visit: Payer: Self-pay

## 2022-05-09 ENCOUNTER — Encounter: Payer: Self-pay | Admitting: Internal Medicine

## 2022-05-10 ENCOUNTER — Other Ambulatory Visit: Payer: Self-pay

## 2022-05-15 ENCOUNTER — Other Ambulatory Visit: Payer: Self-pay

## 2022-05-15 ENCOUNTER — Ambulatory Visit (INDEPENDENT_AMBULATORY_CARE_PROVIDER_SITE_OTHER): Payer: 59 | Admitting: Internal Medicine

## 2022-05-15 VITALS — BP 118/82 | HR 67 | Temp 97.9°F | Ht 62.0 in | Wt 140.8 lb

## 2022-05-15 DIAGNOSIS — Z780 Asymptomatic menopausal state: Secondary | ICD-10-CM | POA: Diagnosis not present

## 2022-05-15 MED ORDER — CITALOPRAM HYDROBROMIDE 10 MG PO TABS
15.0000 mg | ORAL_TABLET | Freq: Every day | ORAL | 0 refills | Status: DC
  Filled 2022-05-15: qty 135, 90d supply, fill #0

## 2022-05-15 MED ORDER — ESTRADIOL 0.25 MG/0.25GM TD GEL
1.0000 | TRANSDERMAL | 1 refills | Status: DC
  Filled 2022-05-15: qty 8, fill #0

## 2022-05-15 MED ORDER — ESTRADIOL 0.025 MG/24HR TD PTTW
1.0000 | MEDICATED_PATCH | TRANSDERMAL | 1 refills | Status: DC
  Filled 2022-05-15: qty 8, 28d supply, fill #0

## 2022-05-15 MED ORDER — AMLODIPINE BESYLATE 2.5 MG PO TABS
2.5000 mg | ORAL_TABLET | Freq: Every day | ORAL | 5 refills | Status: DC
  Filled 2022-05-15 – 2022-06-10 (×2): qty 30, 30d supply, fill #0
  Filled 2022-07-12: qty 30, 30d supply, fill #1
  Filled 2022-08-11: qty 30, 30d supply, fill #2
  Filled 2022-09-08: qty 30, 30d supply, fill #3
  Filled 2022-10-15: qty 30, 30d supply, fill #4
  Filled 2022-11-11: qty 30, 30d supply, fill #5

## 2022-05-15 NOTE — Progress Notes (Signed)
Subjective:  Patient ID: Connie Francis, female    DOB: 1970-06-11  Age: 52 y.o. MRN: [id number]  CC: The encounter diagnosis was Menopause.   HPI Connie Francis presents for  Chief Complaint  Patient presents with   Medical Management of Chronic Issues    Discuss hormone therapy   Menopause: patient has become concerned about continued use of estrogen due to FH of breast CA and personal history of abnormal mammogram. She has been using transdermal estrogen to manage nocturnal hot flashes and for the last several months has been able to reduce her dose comfortably.   She is currently using vivelle transdermal estrogen at the  0.05 mg/24 hr strength  and is wondering if she can switch to a "natural " estrogen supplement  Outpatient Medications Prior to Visit  Medication Sig Dispense Refill   ALPRAZolam (XANAX) 0.25 MG tablet Take 1 tablet (0.25 mg total) by mouth daily as needed. 8 tablet 2   amphetamine-dextroamphetamine (ADDERALL XR) 5 MG 24 hr capsule Take 1 capsule (5 mg total) by mouth daily. 90 capsule 0   ondansetron (ZOFRAN) 4 MG tablet Take 1 tablet (4 mg total) by mouth every 8 (eight) hours as needed for nausea or vomiting. 20 tablet 0   polyethylene glycol (MIRALAX / GLYCOLAX) packet Take 17 g by mouth daily as needed for mild constipation.     Probiotic Product (PROBIOTIC DAILY PO) Take 2 capsules by mouth daily with supper. Gummie     amLODipine (NORVASC) 2.5 MG tablet TAKE 1 TABLET (2.5 MG TOTAL) BY MOUTH DAILY. 30 tablet 5   citalopram (CELEXA) 10 MG tablet Take 1.5 tablets (15 mg total) by mouth daily. 135 tablet 0   estradiol (VIVELLE-DOT) 0.05 MG/24HR patch Place 1 patch (0.05 mg total) onto the skin 2 (two) times a week. 8 patch 0   ALPRAZolam (XANAX) 0.25 MG tablet Take 1 tablet (0.25 mg total) by mouth daily as needed. (Patient not taking: Reported on 05/15/2022) 8 tablet 2   ALPRAZolam (XANAX) 0.25 MG tablet Take 1 tablet (0.25 mg total) by mouth daily as  needed. (Patient not taking: Reported on 05/15/2022) 8 tablet 2   benzonatate (TESSALON) 100 MG capsule Take 1 capsule (100 mg total) by mouth 3 (three) times daily as needed for cough. 30 capsule 0   citalopram (CELEXA) 10 MG tablet Take 1.5 tablets (15 mg total) by mouth daily. (Patient not taking: Reported on 05/15/2022) 135 tablet 0   fluconazole (DIFLUCAN) 150 MG tablet Take 1 tablet (150 mg total) by mouth daily. (Patient not taking: Reported on 05/15/2022) 2 tablet 0   Melatonin ([employer] MELATONIN) 5 MG SUBL Take 1 at bedtime as needed 90 tablet 0   predniSONE (DELTASONE) 20 MG tablet Take 1 tablet (20 mg total) by mouth daily with breakfast. (Patient not taking: Reported on 05/15/2022) 5 tablet 0   No facility-administered medications prior to visit.    Review of Systems;  Patient denies headache, fevers, malaise, unintentional weight loss, skin rash, eye pain, sinus congestion and sinus pain, sore throat, dysphagia,  hemoptysis , cough, dyspnea, wheezing, chest pain, palpitations, orthopnea, edema, abdominal pain, nausea, melena, diarrhea, constipation, flank pain, dysuria, hematuria, urinary  Frequency, nocturia, numbness, tingling, seizures,  Focal weakness, Loss of consciousness,  Tremor, insomnia, depression, anxiety, and suicidal ideation.      Objective:  BP 118/82   Pulse 67   Temp 97.9 F (36.6 C) (Oral)   Ht 5\' 2"  (1.575 m)   Wt  140 lb 12.8 oz (63.9 kg)   SpO2 98%   BMI 25.75 kg/m   BP Readings from Last 3 Encounters:  05/15/22 118/82  04/23/22 128/70  02/05/22 97/66    Wt Readings from Last 3 Encounters:  05/15/22 140 lb 12.8 oz (63.9 kg)  04/23/22 139 lb 3.2 oz (63.1 kg)  02/05/22 136 lb (61.7 kg)    Physical Exam  Lab Results  Component Value Date   HGBA1C 5.3 11/22/2021   HGBA1C 5.3 11/24/2019   HGBA1C 4.8 07/20/2015    Lab Results  Component Value Date   CREATININE 0.61 11/22/2021   CREATININE 0.68 11/20/2020   CREATININE 0.71 05/15/2020    Lab  Results  Component Value Date   WBC 5.1 12/24/2021   HGB 12.8 12/24/2021   HCT 37.5 12/24/2021   PLT 308.0 12/24/2021   GLUCOSE 84 11/22/2021   CHOL 205 (H) 11/22/2021   TRIG 117.0 11/22/2021   HDL 73.70 11/22/2021   LDLCALC 108 (H) 11/22/2021   ALT 9 11/22/2021   AST 13 11/22/2021   NA 138 11/22/2021   K 3.7 11/22/2021   CL 102 11/22/2021   CREATININE 0.61 11/22/2021   BUN 11 11/22/2021   CO2 29 11/22/2021   TSH 0.68 11/22/2021   HGBA1C 5.3 11/22/2021   MICROALBUR <0.7 11/13/2018    MM DIAG BREAST TOMO BILATERAL  Result Date: 03/12/2022 CLINICAL DATA:  Recall from screening for right breast calcifications and bilateral breast asymmetries. EXAM: DIGITAL DIAGNOSTIC BILATERAL MAMMOGRAM WITH TOMOSYNTHESIS; ULTRASOUND RIGHT BREAST LIMITED; ULTRASOUND LEFT BREAST LIMITED TECHNIQUE: Bilateral digital diagnostic mammography and breast tomosynthesis was performed.; Targeted ultrasound examination of the right breast was performed; Targeted ultrasound examination of the left breast was performed. COMPARISON:  Previous exam(s). ACR Breast Density Category c: The breasts are heterogeneously dense, which may obscure small masses. FINDINGS: On the left, the possible asymmetry noted on the current screening exam in the lateral breast persists as a partly obscured oval, 1 cm mass projecting near 3 o'clock. On the right, possible asymmetry noted on the current screening MLO view persists as a focal area of ill-defined density, both fatty attenuation and soft tissue attenuation, in the posterior, lateral aspect of the breast, consistent with an island of fibroglandular tissue. There is no defined mass. Also on the right, the calcifications noted on the current screening exam persists as a small, 5 mm, group of mostly coarse calcifications projecting in the lateral breast, with no associated mass or distortion and no linearity. Targeted left breast ultrasound is performed, showing a cluster of cysts at 3  o'clock, 3 cm the nipple, with thin internal septations, but no other complicating features, measuring 1.2 x 0.5 x 1.0 cm, consistent in size shape and location to the partly obscured mass seen mammographically no solid masses or suspicious lesions. Targeted right breast ultrasound is performed, showing a 4 mm oval hypoechoic lesion in the posterior breast at 9 o'clock, 5 cm the nipple, likely a normal intramammary lymph node. No other abnormality in the posterior, lateral right breast to correspond to the asymmetry seen mammographically. IMPRESSION: 1. Probably benign calcifications in the lateral right breast. Short-term follow-up recommended. 2. Probably benign asymmetry in the right breast, consistent with an [place] of normal fibroglandular tissue. Short-term follow-up recommended. 3. Benign cluster of cysts in the left breast. RECOMMENDATION: 1. Diagnostic right breast mammography in 6 months with magnification views, to reassess the probably benign calcifications and asymmetry. I have discussed the findings and recommendations with the patient. If  applicable, a reminder letter will be sent to the patient regarding the next appointment. BI-RADS CATEGORY  3: Probably benign. Electronically Signed   By: Amie Portland M.D.   On: 03/12/2022 15:00  US BREAST LTD UNI LEFT INC AXILLA  Result Date: 03/12/2022 CLINICAL DATA:  Recall from screening for right breast calcifications and bilateral breast asymmetries. EXAM: DIGITAL DIAGNOSTIC BILATERAL MAMMOGRAM WITH TOMOSYNTHESIS; ULTRASOUND RIGHT BREAST LIMITED; ULTRASOUND LEFT BREAST LIMITED TECHNIQUE: Bilateral digital diagnostic mammography and breast tomosynthesis was performed.; Targeted ultrasound examination of the right breast was performed; Targeted ultrasound examination of the left breast was performed. COMPARISON:  Previous exam(s). ACR Breast Density Category c: The breasts are heterogeneously dense, which may obscure small masses. FINDINGS: On the left,  the possible asymmetry noted on the current screening exam in the lateral breast persists as a partly obscured oval, 1 cm mass projecting near 3 o'clock. On the right, possible asymmetry noted on the current screening MLO view persists as a focal area of ill-defined density, both fatty attenuation and soft tissue attenuation, in the posterior, lateral aspect of the breast, consistent with an island of fibroglandular tissue. There is no defined mass. Also on the right, the calcifications noted on the current screening exam persists as a small, 5 mm, group of mostly coarse calcifications projecting in the lateral breast, with no associated mass or distortion and no linearity. Targeted left breast ultrasound is performed, showing a cluster of cysts at 3 o'clock, 3 cm the nipple, with thin internal septations, but no other complicating features, measuring 1.2 x 0.5 x 1.0 cm, consistent in size shape and location to the partly obscured mass seen mammographically no solid masses or suspicious lesions. Targeted right breast ultrasound is performed, showing a 4 mm oval hypoechoic lesion in the posterior breast at 9 o'clock, 5 cm the nipple, likely a normal intramammary lymph node. No other abnormality in the posterior, lateral right breast to correspond to the asymmetry seen mammographically. IMPRESSION: 1. Probably benign calcifications in the lateral right breast. Short-term follow-up recommended. 2. Probably benign asymmetry in the right breast, consistent with an [place] of normal fibroglandular tissue. Short-term follow-up recommended. 3. Benign cluster of cysts in the left breast. RECOMMENDATION: 1. Diagnostic right breast mammography in 6 months with magnification views, to reassess the probably benign calcifications and asymmetry. I have discussed the findings and recommendations with the patient. If applicable, a reminder letter will be sent to the patient regarding the next appointment. BI-RADS CATEGORY  3:  Probably benign. Electronically Signed   By: Amie Portland M.D.   On: 03/12/2022 15:00  US BREAST LTD UNI RIGHT INC AXILLA  Result Date: 03/12/2022 CLINICAL DATA:  Recall from screening for right breast calcifications and bilateral breast asymmetries. EXAM: DIGITAL DIAGNOSTIC BILATERAL MAMMOGRAM WITH TOMOSYNTHESIS; ULTRASOUND RIGHT BREAST LIMITED; ULTRASOUND LEFT BREAST LIMITED TECHNIQUE: Bilateral digital diagnostic mammography and breast tomosynthesis was performed.; Targeted ultrasound examination of the right breast was performed; Targeted ultrasound examination of the left breast was performed. COMPARISON:  Previous exam(s). ACR Breast Density Category c: The breasts are heterogeneously dense, which may obscure small masses. FINDINGS: On the left, the possible asymmetry noted on the current screening exam in the lateral breast persists as a partly obscured oval, 1 cm mass projecting near 3 o'clock. On the right, possible asymmetry noted on the current screening MLO view persists as a focal area of ill-defined density, both fatty attenuation and soft tissue attenuation, in the posterior, lateral aspect of the breast, consistent with an [place]  of fibroglandular tissue. There is no defined mass. Also on the right, the calcifications noted on the current screening exam persists as a small, 5 mm, group of mostly coarse calcifications projecting in the lateral breast, with no associated mass or distortion and no linearity. Targeted left breast ultrasound is performed, showing a cluster of cysts at 3 o'clock, 3 cm the nipple, with thin internal septations, but no other complicating features, measuring 1.2 x 0.5 x 1.0 cm, consistent in size shape and location to the partly obscured mass seen mammographically no solid masses or suspicious lesions. Targeted right breast ultrasound is performed, showing a 4 mm oval hypoechoic lesion in the posterior breast at 9 o'clock, 5 cm the nipple, likely a normal intramammary  lymph node. No other abnormality in the posterior, lateral right breast to correspond to the asymmetry seen mammographically. IMPRESSION: 1. Probably benign calcifications in the lateral right breast. Short-term follow-up recommended. 2. Probably benign asymmetry in the right breast, consistent with an [place] of normal fibroglandular tissue. Short-term follow-up recommended. 3. Benign cluster of cysts in the left breast. RECOMMENDATION: 1. Diagnostic right breast mammography in 6 months with magnification views, to reassess the probably benign calcifications and asymmetry. I have discussed the findings and recommendations with the patient. If applicable, a reminder letter will be sent to the patient regarding the next appointment. BI-RADS CATEGORY  3: Probably benign. Electronically Signed   By: Amie Portland M.D.   On: 03/12/2022 15:00   Assessment & Plan:  .Menopause Assessment & Plan: She has been weaning her estradiol transdermal dose monthly and is currently on the 0.05 mg dose   Other orders -     amLODIPine Besylate; Take 1 tablet (2.5 mg total) by mouth daily.  Dispense: 30 tablet; Refill: 5 -     Citalopram Hydrobromide; Take 1.5 tablets (15 mg total) by mouth daily.  Dispense: 135 tablet; Refill: 0 -     Estradiol; Place 1 patch onto the skin 2 (two) times a week.  Dispense: 8 patch; Refill: 1     I provided 30 minutes of face-to-face time during this encounter reviewing patient's last visit with me, patient's  most recent visit with cardiology,  nephrology,  and neurology,  recent surgical and non surgical procedures, previous  labs and imaging studies, counseling on currently addressed issues,  and post visit ordering to diagnostics and therapeutics .   Follow-up: No follow-ups on file.   Sherlene Shams, MD

## 2022-05-15 NOTE — Assessment & Plan Note (Addendum)
She has been weaning her estradiol transdermal dose monthly and is currently on the 0.05 mg dose

## 2022-05-15 NOTE — Patient Instructions (Signed)
We have reduced your estrogen patch to the lowest possible dose.    You can reduce this patch to once a week after the first week or two to wean even more

## 2022-05-16 ENCOUNTER — Other Ambulatory Visit: Payer: Self-pay

## 2022-05-20 ENCOUNTER — Other Ambulatory Visit: Payer: Self-pay

## 2022-05-21 ENCOUNTER — Other Ambulatory Visit: Payer: Self-pay

## 2022-06-10 ENCOUNTER — Other Ambulatory Visit: Payer: Self-pay

## 2022-06-25 DIAGNOSIS — L538 Other specified erythematous conditions: Secondary | ICD-10-CM | POA: Diagnosis not present

## 2022-06-25 DIAGNOSIS — D225 Melanocytic nevi of trunk: Secondary | ICD-10-CM | POA: Diagnosis not present

## 2022-06-25 DIAGNOSIS — D2261 Melanocytic nevi of right upper limb, including shoulder: Secondary | ICD-10-CM | POA: Diagnosis not present

## 2022-06-25 DIAGNOSIS — R238 Other skin changes: Secondary | ICD-10-CM | POA: Diagnosis not present

## 2022-06-25 DIAGNOSIS — D2271 Melanocytic nevi of right lower limb, including hip: Secondary | ICD-10-CM | POA: Diagnosis not present

## 2022-06-25 DIAGNOSIS — D2262 Melanocytic nevi of left upper limb, including shoulder: Secondary | ICD-10-CM | POA: Diagnosis not present

## 2022-06-25 DIAGNOSIS — L578 Other skin changes due to chronic exposure to nonionizing radiation: Secondary | ICD-10-CM | POA: Diagnosis not present

## 2022-06-25 DIAGNOSIS — B078 Other viral warts: Secondary | ICD-10-CM | POA: Diagnosis not present

## 2022-06-25 DIAGNOSIS — L820 Inflamed seborrheic keratosis: Secondary | ICD-10-CM | POA: Diagnosis not present

## 2022-07-10 DIAGNOSIS — H5200 Hypermetropia, unspecified eye: Secondary | ICD-10-CM | POA: Diagnosis not present

## 2022-07-24 ENCOUNTER — Other Ambulatory Visit: Payer: Self-pay

## 2022-07-24 DIAGNOSIS — F4001 Agoraphobia with panic disorder: Secondary | ICD-10-CM | POA: Diagnosis not present

## 2022-07-24 DIAGNOSIS — F330 Major depressive disorder, recurrent, mild: Secondary | ICD-10-CM | POA: Diagnosis not present

## 2022-07-24 DIAGNOSIS — F411 Generalized anxiety disorder: Secondary | ICD-10-CM | POA: Diagnosis not present

## 2022-07-24 DIAGNOSIS — F4312 Post-traumatic stress disorder, chronic: Secondary | ICD-10-CM | POA: Diagnosis not present

## 2022-07-24 MED ORDER — CITALOPRAM HYDROBROMIDE 10 MG PO TABS
15.0000 mg | ORAL_TABLET | Freq: Every day | ORAL | 0 refills | Status: DC
  Filled 2022-07-24: qty 135, 90d supply, fill #0

## 2022-07-24 MED ORDER — ALPRAZOLAM 0.25 MG PO TABS
0.2500 mg | ORAL_TABLET | Freq: Every day | ORAL | 2 refills | Status: AC | PRN
  Filled 2022-07-24: qty 8, 8d supply, fill #0

## 2022-07-24 MED ORDER — AMPHETAMINE-DEXTROAMPHET ER 5 MG PO CP24
5.0000 mg | ORAL_CAPSULE | Freq: Every day | ORAL | 0 refills | Status: DC
  Filled 2022-07-24 – 2022-08-11 (×2): qty 90, 90d supply, fill #0

## 2022-07-26 ENCOUNTER — Other Ambulatory Visit: Payer: Self-pay

## 2022-08-07 ENCOUNTER — Other Ambulatory Visit: Payer: Self-pay | Admitting: Internal Medicine

## 2022-08-07 DIAGNOSIS — N6489 Other specified disorders of breast: Secondary | ICD-10-CM

## 2022-08-07 DIAGNOSIS — R921 Mammographic calcification found on diagnostic imaging of breast: Secondary | ICD-10-CM

## 2022-08-11 ENCOUNTER — Other Ambulatory Visit: Payer: Self-pay

## 2022-08-12 ENCOUNTER — Other Ambulatory Visit: Payer: Self-pay

## 2022-09-11 ENCOUNTER — Ambulatory Visit
Admission: RE | Admit: 2022-09-11 | Discharge: 2022-09-11 | Disposition: A | Discharge status: Home / Self Care | Payer: 59 | Source: Ambulatory Visit | Attending: Internal Medicine | Admitting: Internal Medicine

## 2022-09-11 DIAGNOSIS — R92343 Mammographic extreme density, bilateral breasts: Secondary | ICD-10-CM | POA: Diagnosis not present

## 2022-09-11 DIAGNOSIS — N6489 Other specified disorders of breast: Secondary | ICD-10-CM | POA: Diagnosis not present

## 2022-09-11 DIAGNOSIS — R921 Mammographic calcification found on diagnostic imaging of breast: Secondary | ICD-10-CM | POA: Diagnosis not present

## 2022-09-11 DIAGNOSIS — R928 Other abnormal and inconclusive findings on diagnostic imaging of breast: Secondary | ICD-10-CM | POA: Diagnosis not present

## 2022-09-15 ENCOUNTER — Other Ambulatory Visit: Payer: Self-pay | Admitting: Internal Medicine

## 2022-09-15 DIAGNOSIS — R921 Mammographic calcification found on diagnostic imaging of breast: Secondary | ICD-10-CM

## 2022-10-01 ENCOUNTER — Ambulatory Visit (INDEPENDENT_AMBULATORY_CARE_PROVIDER_SITE_OTHER): Payer: 59 | Admitting: Nurse Practitioner

## 2022-10-01 ENCOUNTER — Encounter: Payer: Self-pay | Admitting: Nurse Practitioner

## 2022-10-01 VITALS — BP 118/68 | HR 71 | Temp 98.3°F | Ht 62.0 in | Wt 122.2 lb

## 2022-10-01 DIAGNOSIS — R42 Dizziness and giddiness: Secondary | ICD-10-CM | POA: Diagnosis not present

## 2022-10-01 NOTE — Progress Notes (Signed)
Established Patient Office Visit  Subjective:  Patient ID: Connie Francis, female    DOB: [birth year]  Age: 52 y.o. MRN: [id number]  CC:  Chief Complaint  Patient presents with   Acute Visit    Intermittent feeling faint Trying to lose weight    HPI  Connie Francis presents for intermittent episodes of feeling like fainting since a month.  She episodes happens couple of time a week. Mostly happens in the morning. Feels like she will  passout .  She get better if she drinks somethings.  Also states that this started sluggish feeling remains for 1 to 2 hours and then feeling fine rest of the day.  She states that the last episode happened on Friday morning while she was driving.  She had no episodes since then  Medication change: She has been taking skinny drops for weight loss.   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   Psychogenic dyspepsia 11/27/2012   Thyroid disease    hyper    Past Surgical History:  Procedure Laterality Date   BREAST CYST ASPIRATION Left    negative 2010   BREAST REDUCTION SURGERY Bilateral 05/15/2020   Procedure: BREAST REDUCTION WITH LIPOSUCTION;  Surgeon: Peggye Form, DO;  Location: [facility] OR;  Service: Plastics;  Laterality: Bilateral;   CESAREAN SECTION     COLONOSCOPY WITH PROPOFOL N/A 07/27/2014   Procedure: COLONOSCOPY WITH PROPOFOL;  Surgeon: Midge Minium, MD;  Location: [facility];  Service: Endoscopy;  Laterality: N/A;    COLONOSCOPY WITH PROPOFOL N/A 02/05/2022   Procedure: COLONOSCOPY WITH PROPOFOL;  Surgeon: Regis Bill, MD;  Location: [facility];  Service: Endoscopy;  Laterality: N/A;   colonscopy  07/27/2015   DILATION AND CURETTAGE OF UTERUS  2006   complicated by heart failure   ESOPHAGOGASTRODUODENOSCOPY (EGD) WITH PROPOFOL N/A 07/27/2014   Procedure: ESOPHAGOGASTRODUODENOSCOPY (EGD) WITH PROPOFOL;  Surgeon: Midge Minium, MD;  Location: [facility];  Service: Endoscopy;  Laterality: N/A;   TUBAL LIGATION     UPPER ENDOSCOPY W/ ESOPHAGEAL MANOMETRY     VAGINAL HYSTERECTOMY      Family History  Problem Relation Age of Onset   Hypertension Mother    Breast cancer Maternal Aunt    Cancer Maternal Aunt        parotid Ca   Stroke Paternal Aunt    Cancer Paternal Uncle        lung, nasopharnygeal   Hearing loss Maternal Grandmother    Alcohol abuse Maternal Grandmother    Arthritis Maternal Grandfather    Esophageal cancer Cousin 1   Colon cancer Neg Hx    Liver disease Neg Hx    Ovarian cancer Neg Hx    Diabetes Neg Hx     Social History   Socioeconomic History   Marital status: Married    Spouse name: Not on file   Number of children: Not on  file   Years of education: Not on file   Highest education level: Some college, no degree  Occupational History   Not on file  Tobacco Use   Smoking status: Never   Smokeless tobacco: Never  Vaping Use   Vaping status: Never Used  Substance and Sexual Activity   Alcohol use: Not Currently    Alcohol/week: 1.0 standard drink of alcohol    Types: 1 Glasses of wine per week    Comment: Rare   Drug use: Never   Sexual activity: Yes    Birth control/protection: Surgical  Other Topics Concern   Not on file  Social History Narrative   Not on file   Social Determinants of Health   Financial Resource Strain: Low Risk  (05/15/2022)   Overall Financial Resource Strain (CARDIA)    Difficulty of Paying Living Expenses: Not hard  at all  Food Insecurity: No Food Insecurity (05/15/2022)   Hunger Vital Sign    Worried About Running Out of Food in the Last Year: Never true    Ran Out of Food in the Last Year: Never true  Transportation Needs: No Transportation Needs (05/15/2022)   PRAPARE - Administrator, Civil Service (Medical): No    Lack of Transportation (Non-Medical): No  Physical Activity: Insufficiently Active (05/15/2022)   Exercise Vital Sign    Days of Exercise per Week: 3 days    Minutes of Exercise per Session: 30 min  Stress: No Stress Concern Present (05/15/2022)   [employer] of Occupational Health - Occupational Stress Questionnaire    Feeling of Stress : Only a little  Social Connections: Socially Integrated (05/15/2022)   Social Connection and Isolation Panel [NHANES]    Frequency of Communication with Friends and Family: Three times a week    Frequency of Social Gatherings with Friends and Family: Once a week    Attends Religious Services: More than 4 times per year    Active Member of [employer] or Organizations: Yes    Attends Engineer, structural: More than 4 times per year    Marital Status: Married  Catering manager Violence: Not At Risk (12/17/2016)   Humiliation, Afraid, Rape, and Kick questionnaire    Fear of Current or Ex-Partner: No    Emotionally Abused: No    Physically Abused: No    Sexually Abused: No     Outpatient Medications Prior to Visit  Medication Sig Dispense Refill   ALPRAZolam (XANAX) 0.25 MG tablet Take 1 tablet (0.25 mg total) by mouth daily as needed. 8 tablet 2   ALPRAZolam (XANAX) 0.25 MG tablet Take 1 tablet (0.25 mg total) by mouth daily as needed. 8 tablet 2   amLODipine (NORVASC) 2.5 MG tablet Take 1 tablet (2.5 mg total) by mouth daily. 30 tablet 5   amphetamine-dextroamphetamine (ADDERALL XR) 5 MG 24 hr capsule Take 1 capsule (5 mg total) by mouth daily. 90 capsule 0   citalopram (CELEXA) 10 MG tablet Take 1.5 tablets (15 mg total) by mouth  daily. 135 tablet 0   estradiol (VIVELLE-DOT) 0.025 MG/24HR Place 1 patch onto the skin 2 (two) times a week. 8 patch 1   ondansetron (ZOFRAN) 4 MG tablet Take 1 tablet (4 mg total) by mouth every 8 (eight) hours as needed for nausea or vomiting. 20 tablet 0   polyethylene glycol (MIRALAX / GLYCOLAX) packet Take 17 g by mouth daily as needed for mild constipation.     Probiotic Product (PROBIOTIC DAILY PO) Take  2 capsules by mouth daily with supper. Gummie     No facility-administered medications prior to visit.    Allergies  Allergen Reactions   Latex     Swelling and itching    Lisinopril Cough    cough   Penicillins Swelling    Reaction: 1996   Shellfish Allergy     hives   Sulfa Antibiotics     Rash and itching    Lamictal [Lamotrigine] Rash    Blisters     ROS Review of Systems Negative unless indicated in HPI.    Objective:    Physical Exam Constitutional:      Appearance: Normal appearance.  HENT:     Right Ear: Tympanic membrane normal.     Left Ear: Tympanic membrane normal.     Mouth/Throat:     Mouth: Mucous membranes are moist.  Cardiovascular:     Rate and Rhythm: Normal rate and regular rhythm.     Pulses: Normal pulses.     Heart sounds: Normal heart sounds.  Pulmonary:     Effort: Pulmonary effort is normal.     Breath sounds: Normal breath sounds.  Abdominal:     Tenderness: There is no abdominal tenderness.  Musculoskeletal:     Cervical back: Normal range of motion.  Skin:    General: Skin is warm.  Neurological:     General: No focal deficit present.     Mental Status: She is alert. Mental status is at baseline.  Psychiatric:        Mood and Affect: Mood normal.        Behavior: Behavior normal.        Thought Content: Thought content normal.        Judgment: Judgment normal.     BP 118/68   Pulse 71   Temp 98.3 F (36.8 C)   Ht 5\' 2"  (1.575 m)   Wt 122 lb 3.2 oz (55.4 kg)   SpO2 98%   BMI 22.35 kg/m  Wt Readings from  Last 3 Encounters:  10/01/22 122 lb 3.2 oz (55.4 kg)  05/15/22 140 lb 12.8 oz (63.9 kg)  04/23/22 139 lb 3.2 oz (63.1 kg)     Health Maintenance  Topic Date Due   Zoster Vaccines- Shingrix (1 of 2) Never done   DTaP/Tdap/Td (2 - Td or Tdap) 07/06/2020   COVID-19 Vaccine (4 - 2023-24 season) 10/17/2022 (Originally 09/15/2022)   INFLUENZA VACCINE  04/14/2023 (Originally 08/15/2022)   MAMMOGRAM  03/12/2024   Colonoscopy  02/06/2032   Hepatitis C Screening  Completed   HPV VACCINES  Aged Out   HIV Screening  Discontinued    There are no preventive care reminders to display for this patient.  Lab Results  Component Value Date   TSH 0.54 10/01/2022   Lab Results  Component Value Date   WBC 6.3 10/01/2022   HGB 12.5 10/01/2022   HCT 37.3 10/01/2022   MCV 88.9 10/01/2022   PLT 298.0 10/01/2022   Lab Results  Component Value Date   NA 139 10/01/2022   K 3.9 10/01/2022   CO2 27 10/01/2022   GLUCOSE 90 10/01/2022   BUN 20 10/01/2022   CREATININE 0.72 10/01/2022   BILITOT 0.2 10/01/2022   ALKPHOS 77 10/01/2022   AST 17 10/01/2022   ALT 11 10/01/2022   PROT 7.1 10/01/2022   ALBUMIN 4.3 10/01/2022   CALCIUM 9.3 10/01/2022   ANIONGAP 6 05/15/2020   GFR 97.68 10/01/2022   Lab Results  Component Value Date   CHOL 205 (H) 11/22/2021   Lab Results  Component Value Date   HDL 73.70 11/22/2021   Lab Results  Component Value Date   LDLCALC 108 (H) 11/22/2021   Lab Results  Component Value Date   TRIG 117.0 11/22/2021   Lab Results  Component Value Date   CHOLHDL 3 11/22/2021   Lab Results  Component Value Date   HGBA1C 5.3 11/22/2021      Assessment & Plan:  Feeling faint Assessment & Plan: EKG normal sinus rhythm with no abnormalities. No changes in orthostatic blood pressure. Will check TSH, CBC and CMP. If symptoms not improving will pursue neuroimaging. Advised patient to let us know if symptoms not improving.  Orders: -     CBC -     Comprehensive  metabolic panel -     TSH -     EKG 12-Lead    Follow-up: Return if symptoms worsen or fail to improve.   Kara Dies, NP

## 2022-10-06 ENCOUNTER — Encounter: Payer: Self-pay | Admitting: Nurse Practitioner

## 2022-10-06 NOTE — Assessment & Plan Note (Signed)
EKG normal sinus rhythm with no abnormalities. No changes in orthostatic blood pressure. Will check TSH, CBC and CMP. If symptoms not improving will pursue neuroimaging. Advised patient to let us know if symptoms not improving.

## 2022-10-18 ENCOUNTER — Other Ambulatory Visit: Payer: Self-pay

## 2022-10-18 DIAGNOSIS — F4312 Post-traumatic stress disorder, chronic: Secondary | ICD-10-CM | POA: Diagnosis not present

## 2022-10-18 DIAGNOSIS — F411 Generalized anxiety disorder: Secondary | ICD-10-CM | POA: Diagnosis not present

## 2022-10-18 DIAGNOSIS — F330 Major depressive disorder, recurrent, mild: Secondary | ICD-10-CM | POA: Diagnosis not present

## 2022-10-18 DIAGNOSIS — F4001 Agoraphobia with panic disorder: Secondary | ICD-10-CM | POA: Diagnosis not present

## 2022-10-18 MED ORDER — AMPHETAMINE-DEXTROAMPHET ER 5 MG PO CP24: 5.0000 mg | ORAL_CAPSULE | Freq: Every day | ORAL | 0 refills | Status: DC

## 2022-10-18 MED ORDER — ALPRAZOLAM 0.25 MG PO TABS
0.2500 mg | ORAL_TABLET | Freq: Every day | ORAL | 2 refills | Status: AC | PRN
  Filled 2022-10-18: qty 8, 8d supply, fill #0

## 2022-10-18 MED ORDER — CITALOPRAM HYDROBROMIDE 10 MG PO TABS
15.0000 mg | ORAL_TABLET | Freq: Every day | ORAL | 0 refills | Status: DC
  Filled 2022-10-18: qty 135, 90d supply, fill #0

## 2022-10-24 ENCOUNTER — Encounter: Payer: Self-pay | Admitting: Internal Medicine

## 2022-12-09 ENCOUNTER — Other Ambulatory Visit: Payer: Self-pay

## 2022-12-09 ENCOUNTER — Other Ambulatory Visit: Payer: Self-pay | Admitting: Internal Medicine

## 2022-12-09 MED ORDER — AMLODIPINE BESYLATE 2.5 MG PO TABS
2.5000 mg | ORAL_TABLET | Freq: Every day | ORAL | 5 refills | Status: DC
  Filled 2022-12-09: qty 30, 30d supply, fill #0
  Filled 2023-01-08: qty 30, 30d supply, fill #1
  Filled 2023-02-07: qty 30, 30d supply, fill #2
  Filled 2023-03-09: qty 30, 30d supply, fill #3
  Filled 2023-04-08: qty 30, 30d supply, fill #4
  Filled 2023-05-12: qty 30, 30d supply, fill #5

## 2022-12-11 ENCOUNTER — Other Ambulatory Visit: Payer: Self-pay

## 2022-12-11 ENCOUNTER — Encounter: Payer: Self-pay | Admitting: Internal Medicine

## 2022-12-11 ENCOUNTER — Ambulatory Visit (INDEPENDENT_AMBULATORY_CARE_PROVIDER_SITE_OTHER): Payer: 59 | Admitting: Internal Medicine

## 2022-12-11 VITALS — BP 120/70 | HR 73 | Temp 98.0°F | Resp 16 | Ht 61.5 in | Wt 114.5 lb

## 2022-12-11 DIAGNOSIS — Z780 Asymptomatic menopausal state: Secondary | ICD-10-CM | POA: Diagnosis not present

## 2022-12-11 DIAGNOSIS — Z79899 Other long term (current) drug therapy: Secondary | ICD-10-CM

## 2022-12-11 DIAGNOSIS — K581 Irritable bowel syndrome with constipation: Secondary | ICD-10-CM

## 2022-12-11 DIAGNOSIS — D649 Anemia, unspecified: Secondary | ICD-10-CM

## 2022-12-11 DIAGNOSIS — I10 Essential (primary) hypertension: Secondary | ICD-10-CM | POA: Diagnosis not present

## 2022-12-11 DIAGNOSIS — R42 Dizziness and giddiness: Secondary | ICD-10-CM

## 2022-12-11 DIAGNOSIS — N6009 Solitary cyst of unspecified breast: Secondary | ICD-10-CM | POA: Diagnosis not present

## 2022-12-11 DIAGNOSIS — R7301 Impaired fasting glucose: Secondary | ICD-10-CM | POA: Diagnosis not present

## 2022-12-11 DIAGNOSIS — E785 Hyperlipidemia, unspecified: Secondary | ICD-10-CM | POA: Diagnosis not present

## 2022-12-11 DIAGNOSIS — Z0000 Encounter for general adult medical examination without abnormal findings: Secondary | ICD-10-CM

## 2022-12-11 DIAGNOSIS — Z0001 Encounter for general adult medical examination with abnormal findings: Secondary | ICD-10-CM

## 2022-12-11 LAB — HEMOGLOBIN A1C: Hgb A1c MFr Bld: 5.5 % (ref 4.6–6.5)

## 2022-12-11 LAB — LIPID PANEL
Cholesterol: 191 mg/dL (ref 0–200)
HDL: 58.1 mg/dL (ref >39.00)
LDL Cholesterol: 120 mg/dL — ABNORMAL HIGH (ref 0–99)
NonHDL: 133.14
Total CHOL/HDL Ratio: 3
Triglycerides: 68 mg/dL (ref 0.0–149.0)
VLDL: 13.6 mg/dL (ref 0.0–40.0)

## 2022-12-11 LAB — MICROALBUMIN / CREATININE URINE RATIO
Creatinine,U: 56.7 mg/dL
Microalb Creat Ratio: 1.2 mg/g (ref 0.0–30.0)
Microalb, Ur: <0.7 mg/dL (ref 0.0–1.9)

## 2022-12-11 LAB — LDL CHOLESTEROL, DIRECT: Direct LDL: 110 mg/dL

## 2022-12-11 MED ORDER — ESTRADIOL 0.1 MG/GM VA CREA
1.0000 g | TOPICAL_CREAM | Freq: Every day | VAGINAL | 0 refills | Status: DC
  Filled 2022-12-11: qty 42.5, 42d supply, fill #0

## 2022-12-11 NOTE — Patient Instructions (Addendum)
I have prescribed Estrace vaginal cream to use for the vaginal dryness:  Insert 1 gram intravaginally at bedtime daily for 2 weeks,  Then reduce use to twice weekly thereafter    For the bowels :  Try adding daily dose of benefiber for bowels and appetite suppression

## 2022-12-11 NOTE — Progress Notes (Signed)
Patient ID: Connie Francis, female    DOB: 1970-04-28  Age: 52 y.o. MRN: [id number]  The patient is here for annual preventive examination and management of other chronic and acute problems.   The risk factors are reflected in the social history.   The roster of all physicians providing medical care to patient - is listed in the Snapshot section of the chart.   Activities of daily living:  The patient is 100% independent in all ADLs: dressing, toileting, feeding as well as independent mobility   Home safety : The patient has smoke detectors in the home. They wear seatbelts.  There are no unsecured firearms at home. There is no violence in the home.    There is no risks for hepatitis, STDs or HIV. There is no   history of blood transfusion. They have no travel history to infectious disease endemic areas of the world.   The patient has seen their dentist in the last six month. They have seen their eye doctor in the last year. The patinet  denies slight hearing difficulty with regard to whispered voices and some television programs.  They have deferred audiologic testing in the last year.  They do not  have excessive sun exposure. Discussed the need for sun protection: hats, long sleeves and use of sunscreen if there is significant sun exposure.    Diet: the importance of a healthy diet is discussed. They do have a healthy diet.   The benefits of regular aerobic exercise were discussed. The patient  exercises  3 to 5 days per week  for  60 minutes.    Depression screen: there are no signs or vegative symptoms of depression- irritability, change in appetite, anhedonia, sadness/tearfullness.   The following portions of the patient's history were reviewed and updated as appropriate: allergies, current medications, past family history, past medical history,  past surgical history, past social history  and problem list.   Visual acuity was not assessed per patient preference since the patient has  regular follow up with an  ophthalmologist. Hearing and body mass index were assessed and reviewed.    During the course of the visit the patient was educated and counseled about appropriate screening and preventive services including : fall prevention , diabetes screening, nutrition counseling, colorectal cancer screening, and recommended immunizations.    Chief Complaint:   Onyx has had an intentional weight loss of 36 LBS SINCE MAY 2024  .  She has been using a commerical product called  "SKINNY DROPS " recommended by a 'fried  The product is available online and is comprised of 2 liquid supplements  called  1) " SLENDERIIX  PRE MEAL"   AND 2) "XCELER8 " (VITAMIN SUPPLEMENT) .  She  STOPPED USING the product in September beucase she began to have recurrent  symptoms of presyncope.  Her last episode [place] while [place] the interstate.  She was evaluate  don SEPT 17  BY NP KAUR.  EKG, TSH. CBC.   CMET were all normal.   SINCE STOPPING THE DROPS the episodes have stopped occurring,  and she has lost and additional 2 LBS .  She states that tthe drops transiently improved her symptoms of  menopause,  but made her constipation worse   Review of Symptoms  Patient denies headache, fevers, malaise, unintentional weight loss, skin rash, eye pain, sinus congestion and sinus pain, sore throat, dysphagia,  hemoptysis , cough, dyspnea, wheezing, chest pain, palpitations, orthopnea, edema, abdominal pain, nausea, melena,  diarrhea, constipation, flank pain, dysuria, hematuria, urinary  Frequency, nocturia, numbness, tingling, seizures,  Focal weakness, Loss of consciousness,  Tremor, insomnia, depression, anxiety, and suicidal ideation.    Physical Exam:  BP 120/70   Pulse 73   Temp 98 F (36.7 C)   Resp 16   Ht 5' 1.5" (1.562 m)   Wt 114 lb 8 oz (51.9 kg)   SpO2 96%   BMI 21.28 kg/m    Physical Exam Vitals reviewed.  Constitutional:      General: She is not in acute distress.    Appearance:  Normal appearance. She is well-developed and normal weight. She is not ill-appearing, toxic-appearing or diaphoretic.  HENT:     Head: Normocephalic.     Right Ear: Tympanic membrane, ear canal and external ear normal. There is no impacted cerumen.     Left Ear: Tympanic membrane, ear canal and external ear normal. There is no impacted cerumen.     Nose: Nose normal.     Mouth/Throat:     Mouth: Mucous membranes are moist.     Pharynx: Oropharynx is clear.  Eyes:     General: No scleral icterus.       Right eye: No discharge.        Left eye: No discharge.     Conjunctiva/sclera: Conjunctivae normal.     Pupils: Pupils are equal, round, and reactive to light.  Neck:     Thyroid: No thyromegaly.     Vascular: No carotid bruit or JVD.  Cardiovascular:     Rate and Rhythm: Normal rate and regular rhythm.     Heart sounds: Normal heart sounds.  Pulmonary:     Effort: Pulmonary effort is normal. No respiratory distress.     Breath sounds: Normal breath sounds.  Chest:  Breasts:    Breasts are symmetrical.     Right: Normal. No swelling, inverted nipple, mass, nipple discharge, skin change or tenderness.     Left: Normal. No swelling, inverted nipple, mass, nipple discharge, skin change or tenderness.  Abdominal:     General: Bowel sounds are normal.     Palpations: Abdomen is soft. There is no mass.     Tenderness: There is no abdominal tenderness. There is no guarding or rebound.  Musculoskeletal:        General: Normal range of motion.     Cervical back: Normal range of motion and neck supple.  Lymphadenopathy:     Cervical: No cervical adenopathy.     Upper Body:     Right upper body: No supraclavicular, axillary or pectoral adenopathy.     Left upper body: No supraclavicular, axillary or pectoral adenopathy.  Skin:    General: Skin is warm and dry.  Neurological:     General: No focal deficit present.     Mental Status: She is alert and oriented to person, place, and time.  Mental status is at baseline.  Psychiatric:        Mood and Affect: Mood normal.        Behavior: Behavior normal.        Thought Content: Thought content normal.        Judgment: Judgment normal.   Assessment and Plan: Essential hypertension  Long-term use of high-risk medication  Anemia, unspecified type  Impaired fasting glucose  Dyslipidemia  Encounter for preventative adult health care examination    No follow-ups on file.  Sherlene Shams, MD

## 2022-12-13 NOTE — Assessment & Plan Note (Addendum)
Cautioned her against overuse of stimulant laxatives.  Motility agents (Linzess, Amitiza and Trulance) discussed but deferred due to cost.  Recommend adding Benefiber with prebiotics as a daily premeal supplement. And prn use of miralax

## 2022-12-13 NOTE — Assessment & Plan Note (Signed)
Continue annual screening.

## 2022-12-13 NOTE — Assessment & Plan Note (Signed)
she reports compliance with medication regimen    She is not using NSAIDs daily.   She has been checking her BP at home using a calibrated machine and reports that her most recent readings have been <  130/80 . Continue use of amlodipine 2.5 mg daily

## 2022-12-13 NOTE — Assessment & Plan Note (Signed)
age appropriate education and counseling updated, referrals for preventative services and immunizations addressed, dietary and smoking counseling addressed, most recent labs reviewed.  I have personally reviewed her recent evaluation by Dr Evelene Croon, including EKG and labs,  and have noted:   1) the patient's medical and social history 2) The pt's use of alcohol, tobacco, and illicit drugs 3) The patient's current medications and supplements 4) Functional ability including ADL's, fall risk, home safety risk, hearing and visual impairment 5) Diet and physical activities 6) Evidence for depression or mood disorder 7) The patient's height, weight, and BMI have been recorded in the chart  I have made referrals, and provided counseling and education based on review of the above

## 2022-12-13 NOTE — Assessment & Plan Note (Signed)
Adding estrace cream for management of vaginal dryness/atrophy cause dyspareunia

## 2022-12-13 NOTE — Assessment & Plan Note (Signed)
Secondary to excessive weight loss and fasting.  Now resolved with discontuation of "skinny drops"

## 2023-01-08 ENCOUNTER — Other Ambulatory Visit: Payer: Self-pay | Admitting: Internal Medicine

## 2023-01-09 ENCOUNTER — Other Ambulatory Visit: Payer: Self-pay

## 2023-01-09 MED ORDER — ESTRADIOL 0.1 MG/GM VA CREA
1.0000 g | TOPICAL_CREAM | Freq: Every day | VAGINAL | 0 refills | Status: DC
  Filled 2023-01-09: qty 42.5, 90d supply, fill #0

## 2023-01-17 ENCOUNTER — Other Ambulatory Visit: Payer: Self-pay

## 2023-01-17 DIAGNOSIS — F4001 Agoraphobia with panic disorder: Secondary | ICD-10-CM | POA: Diagnosis not present

## 2023-01-17 DIAGNOSIS — F330 Major depressive disorder, recurrent, mild: Secondary | ICD-10-CM | POA: Diagnosis not present

## 2023-01-17 DIAGNOSIS — F411 Generalized anxiety disorder: Secondary | ICD-10-CM | POA: Diagnosis not present

## 2023-01-17 DIAGNOSIS — F4312 Post-traumatic stress disorder, chronic: Secondary | ICD-10-CM | POA: Diagnosis not present

## 2023-01-17 MED ORDER — CITALOPRAM HYDROBROMIDE 10 MG PO TABS
15.0000 mg | ORAL_TABLET | Freq: Every day | ORAL | 0 refills | Status: AC
  Filled 2023-01-17: qty 135, 90d supply, fill #0

## 2023-01-17 MED ORDER — AMPHETAMINE-DEXTROAMPHET ER 5 MG PO CP24
5.0000 mg | ORAL_CAPSULE | Freq: Every day | ORAL | 0 refills | Status: DC
  Filled 2023-01-17: qty 90, 90d supply, fill #0

## 2023-01-17 MED ORDER — ALPRAZOLAM 0.25 MG PO TABS
0.2500 mg | ORAL_TABLET | Freq: Every day | ORAL | 2 refills | Status: AC | PRN
  Filled 2023-01-17: qty 8, 8d supply, fill #0

## 2023-03-10 ENCOUNTER — Other Ambulatory Visit: Payer: Self-pay

## 2023-03-10 MED ORDER — AZITHROMYCIN 250 MG PO TABS
ORAL_TABLET | ORAL | 0 refills | Status: DC
  Filled 2023-03-10: qty 6, 5d supply, fill #0

## 2023-03-17 ENCOUNTER — Ambulatory Visit
Admission: RE | Admit: 2023-03-17 | Discharge: 2023-03-17 | Disposition: A | Discharge status: Home / Self Care | Payer: 59 | Source: Ambulatory Visit | Attending: Internal Medicine | Admitting: Internal Medicine

## 2023-03-17 DIAGNOSIS — R921 Mammographic calcification found on diagnostic imaging of breast: Secondary | ICD-10-CM

## 2023-03-17 DIAGNOSIS — R92333 Mammographic heterogeneous density, bilateral breasts: Secondary | ICD-10-CM | POA: Diagnosis not present

## 2023-04-25 ENCOUNTER — Other Ambulatory Visit: Payer: Self-pay

## 2023-04-25 DIAGNOSIS — F411 Generalized anxiety disorder: Secondary | ICD-10-CM | POA: Diagnosis not present

## 2023-04-25 DIAGNOSIS — F330 Major depressive disorder, recurrent, mild: Secondary | ICD-10-CM | POA: Diagnosis not present

## 2023-04-25 DIAGNOSIS — F4001 Agoraphobia with panic disorder: Secondary | ICD-10-CM | POA: Diagnosis not present

## 2023-04-25 DIAGNOSIS — F4312 Post-traumatic stress disorder, chronic: Secondary | ICD-10-CM | POA: Diagnosis not present

## 2023-04-25 MED ORDER — ALPRAZOLAM 0.25 MG PO TABS
0.2500 mg | ORAL_TABLET | Freq: Every day | ORAL | 2 refills | Status: AC | PRN
  Filled 2023-04-25: qty 8, 8d supply, fill #0

## 2023-04-25 MED ORDER — CITALOPRAM HYDROBROMIDE 10 MG PO TABS
15.0000 mg | ORAL_TABLET | Freq: Every day | ORAL | 1 refills | Status: AC
  Filled 2023-04-25: qty 135, 90d supply, fill #0

## 2023-05-12 ENCOUNTER — Other Ambulatory Visit: Payer: Self-pay

## 2023-05-12 ENCOUNTER — Other Ambulatory Visit: Payer: Self-pay | Admitting: Internal Medicine

## 2023-05-12 MED ORDER — ESTRADIOL 0.1 MG/GM VA CREA
1.0000 g | TOPICAL_CREAM | Freq: Every day | VAGINAL | 0 refills | Status: DC
  Filled 2023-05-12: qty 42.5, 42d supply, fill #0

## 2023-05-16 DIAGNOSIS — K581 Irritable bowel syndrome with constipation: Secondary | ICD-10-CM | POA: Diagnosis not present

## 2023-06-12 ENCOUNTER — Other Ambulatory Visit: Payer: Self-pay | Admitting: Internal Medicine

## 2023-06-12 ENCOUNTER — Other Ambulatory Visit: Payer: Self-pay

## 2023-06-12 MED ORDER — AMLODIPINE BESYLATE 2.5 MG PO TABS
2.5000 mg | ORAL_TABLET | Freq: Every day | ORAL | 1 refills | Status: DC
Start: 2023-06-12 — End: 2023-12-09
  Filled 2023-06-12: qty 90, 90d supply, fill #0
  Filled 2023-09-03: qty 90, 90d supply, fill #1

## 2023-06-26 DIAGNOSIS — L821 Other seborrheic keratosis: Secondary | ICD-10-CM | POA: Diagnosis not present

## 2023-06-26 DIAGNOSIS — D2271 Melanocytic nevi of right lower limb, including hip: Secondary | ICD-10-CM | POA: Diagnosis not present

## 2023-06-26 DIAGNOSIS — D2261 Melanocytic nevi of right upper limb, including shoulder: Secondary | ICD-10-CM | POA: Diagnosis not present

## 2023-06-26 DIAGNOSIS — D2262 Melanocytic nevi of left upper limb, including shoulder: Secondary | ICD-10-CM | POA: Diagnosis not present

## 2023-06-26 DIAGNOSIS — D2272 Melanocytic nevi of left lower limb, including hip: Secondary | ICD-10-CM | POA: Diagnosis not present

## 2023-06-26 DIAGNOSIS — D225 Melanocytic nevi of trunk: Secondary | ICD-10-CM | POA: Diagnosis not present

## 2023-08-11 ENCOUNTER — Other Ambulatory Visit: Payer: Self-pay

## 2023-08-11 ENCOUNTER — Telehealth: Admitting: Physician Assistant

## 2023-08-11 DIAGNOSIS — J069 Acute upper respiratory infection, unspecified: Secondary | ICD-10-CM | POA: Diagnosis not present

## 2023-08-11 MED ORDER — FLUTICASONE PROPIONATE 50 MCG/ACT NA SUSP
2.0000 | Freq: Every day | NASAL | 0 refills | Status: DC
  Filled 2023-08-11: qty 16, 30d supply, fill #0

## 2023-08-11 MED ORDER — PROMETHAZINE-DM 6.25-15 MG/5ML PO SYRP
5.0000 mL | ORAL_SOLUTION | Freq: Four times a day (QID) | ORAL | 0 refills | Status: DC | PRN
  Filled 2023-08-11: qty 118, 6d supply, fill #0

## 2023-08-11 MED ORDER — NAPROXEN 500 MG PO TABS
500.0000 mg | ORAL_TABLET | Freq: Two times a day (BID) | ORAL | 0 refills | Status: DC
  Filled 2023-08-11: qty 30, 15d supply, fill #0

## 2023-08-11 NOTE — Progress Notes (Signed)
 E-Visit for [employer] Virus / COVID Screening  Your current symptoms could be consistent with COVID.  Please complete a Covid test either at home or check with your local pharmacy to see if they provide testing.    If you test positive for COVID-19, meaning that you were infected with the novel coronavirus and could give the virus to others.  Most people with COVID-19 have mild illness and can recover at home without medical care. Do not leave your home, except to get medical care. Do not visit public areas and do not go to places where you are unable to wear a mask. It is important that you stay home  to take care for yourself and to help protect other people in your home and community.      Isolation Instructions:   You are to isolate at home until you have been fever free for at least 24 hours without a fever-reducing medication, and symptoms have been steadily improving for 24 hours. At that time,  you can end isolation but need to mask for an additional 5 days.  If you must be around other household members who do not have symptoms, you need to make sure that both you and the family members are masking consistently with a high-quality mask.  If you note any worsening of symptoms despite treatment, please seek an in-person evaluation ASAP. If you note any significant shortness of breath or any chest pain, please seek ER evaluation. Please do not delay care!  Go to the nearest hospital ED for assessment if fever/cough/breathlessness are severe or illness seems like a threat to life.    The following symptoms may appear 2-14 days after exposure: Fever Cough Shortness of breath or difficulty breathing Chills Repeated shaking with chills Muscle pain Headache Sore throat New loss of taste or smell Fatigue Congestion or runny nose Nausea or vomiting Diarrhea  You can use medication such as I have prescribed Phenergan  DM 6.25 mg/15 mg. You make take one teaspoon / 5 ml every 4-6 hours as  needed for cough, I have prescribed an anti-inflammatory - Naprosyn  500 mg. Take twice daily as needed for fever or body aches for 2 weeks, and I have prescribed Fluticasone  nasal spray 2 sprays in each nostril one time per dayasal spray 2 sprays in each nostril one time per day  You may also take acetaminophen  (Tylenol ) as needed for fever.  HOME CARE Only take medications as instructed by your medical team. Drink plenty of fluids and get plenty of rest. A steam or ultrasonic humidifier can help if you have congestion.  GET HELP RIGHT AWAY IF YOU HAVE EMERGENCY WARNING SIGNS.  Call 911 or proceed to your closest emergency facility if: You develop worsening high fever. Trouble breathing Bluish lips or face Persistent pain or pressure in the chest New confusion Inability to wake or stay awake You cough up blood. Your symptoms become more severe Inability to hold down food or fluids  This list is not all possible symptoms. Contact your medical provider for any symptoms that are severe or concerning to you.   Your e-visit answers were reviewed by a board certified advanced clinical practitioner to complete your personal care plan.  Depending on the condition, your plan could have included both over the counter or prescription medications.  If there is a problem, please reply once you have received a response from your provider.  Your safety is important to us .  If you have drug allergies check your  prescription carefully.    You can use MyChart to ask questions about today's visit, request a non-urgent call back, or ask for a work or school excuse for 24 hours related to this e-Visit. If it has been greater than 24 hours you will need to follow up with your provider or enter a new e-Visit to address those concerns. You will get an e-mail in the next two days asking about your experience.  I hope that your e-visit has been valuable and will speed your recovery. Thank you for using  e-visits.      I have spent 5 minutes in review of e-visit questionnaire, review and updating patient chart, medical decision making and response to patient.   Delon CHRISTELLA Dickinson, PA-C

## 2023-08-14 ENCOUNTER — Ambulatory Visit: Payer: Self-pay

## 2023-08-14 ENCOUNTER — Ambulatory Visit: Admitting: Nurse Practitioner

## 2023-08-14 ENCOUNTER — Other Ambulatory Visit: Payer: Self-pay

## 2023-08-14 VITALS — BP 120/82 | HR 79 | Temp 98.4°F | Ht 61.5 in | Wt 114.0 lb

## 2023-08-14 DIAGNOSIS — J0100 Acute maxillary sinusitis, unspecified: Secondary | ICD-10-CM | POA: Diagnosis not present

## 2023-08-14 DIAGNOSIS — R051 Acute cough: Secondary | ICD-10-CM | POA: Diagnosis not present

## 2023-08-14 LAB — POC COVID19 BINAXNOW: SARS Coronavirus 2 Ag: NEGATIVE

## 2023-08-14 MED ORDER — AZITHROMYCIN 250 MG PO TABS
ORAL_TABLET | ORAL | 0 refills | Status: AC
Start: 2023-08-14 — End: 2023-08-19
  Filled 2023-08-14: qty 6, 5d supply, fill #0

## 2023-08-14 NOTE — Telephone Encounter (Signed)
 FYI Only or Action Required?: FYI only for provider.  Patient was last seen in primary care on 12/11/2022 by Marylynn Verneita CROME, MD.  Called Nurse Triage reporting No chief complaint on file..  Symptoms began several days ago.  Interventions attempted: Prescription medications: Naproxen , Promethazine .  Symptoms are: gradually worsening.  Triage Disposition: No disposition on file.  Patient/caregiver understands and will follow disposition?:  Reason for Disposition  [1] Continuous (nonstop) coughing interferes with work or school AND [2] no improvement using cough treatment per Care Advice  Answer Assessment - Initial Assessment Questions 1. ONSET: When did the cough begin?      Since Saturday  2. SEVERITY: How bad is the cough today?      Intermittent, Worse at night and in the morning  3. SPUTUM: Describe the color of your sputum (e.g., none, dry cough; clear, white, yellow, green)     Yellowish Color  4. HEMOPTYSIS: Are you coughing up any blood? If Yes, ask: How much? (e.g., flecks, streaks, tablespoons, etc.)     No  5. DIFFICULTY BREATHING: Are you having difficulty breathing? If Yes, ask: How bad is it? (e.g., mild, moderate, severe)      No  6. FEVER: Do you have a fever? If Yes, ask: What is your temperature, how was it measured, and when did it start?     At onset, and on Tuesday   7. CARDIAC HISTORY: Do you have any history of heart disease? (e.g., heart attack, congestive heart failure)      No  8. LUNG HISTORY: Do you have any history of lung disease?  (e.g., pulmonary embolus, asthma, emphysema)     No  9. PE RISK FACTORS: Do you have a history of blood clots? (or: recent major surgery, recent prolonged travel, bedridden)     No  10. OTHER SYMPTOMS: Do you have any other symptoms? (e.g., runny nose, wheezing, chest pain)       Congestion, Mouth Soreness  11. PREGNANCY: Is there any chance you are pregnant? When was your last  menstrual period?       No and NO  12. TRAVEL: Have you traveled out of the country in the last month? (e.g., travel history, exposures)       Went to the beach over the weekend  Protocols used: Cough - Acute Productive-A-AH

## 2023-08-14 NOTE — Progress Notes (Signed)
 Leron Glance, NP-C Phone: [phone]  Connie Francis is a 53 y.o. female who presents today for cough.   Discussed the use of AI scribe software for clinical note transcription with the patient, who gave verbal consent to proceed.  History of Present Illness   Connie Francis is a 53 year old female who presents with cough, congestion, and a raw sensation on the roof of her mouth.  Symptoms began last Saturday night while she was at the beach, with progressive worsening. An e-visit on Monday resulted in prescriptions for nasal spray, naproxen , and cough syrup. Initially, she felt better yesterday but has since experienced a regression in her symptoms.  She feels flushed and sweaty upon exertion but does not believe she has a fever. She reports congestion that she describes as more upper, with drainage in her throat. No sore throat, shortness of breath, or significant headaches. She notes a persistent epistaxis on the left side, possibly due to nasal spray use. She has tested negative for COVID-19 twice, on Sunday and Monday.  Current medications include Phenergan  and naproxen . She uses a saline nasal spray and Mucinex during the day. The cough syrup aids her sleep, though she wakes up when it wears off. She is allergic to penicillin and sulfur.  Her symptoms have impacted her daily activities, causing impatience as she is usually very active. She shares a birthday with her son, which was on Monday, and was unable to celebrate due to her illness.      Social History   Tobacco Use  Smoking Status Never  Smokeless Tobacco Never    Current Outpatient Medications on File Prior to Visit  Medication Sig Dispense Refill   ALPRAZolam  (XANAX ) 0.25 MG tablet Take 1 tablet (0.25 mg total) by mouth daily as needed. 8 tablet 2   ALPRAZolam  (XANAX ) 0.25 MG tablet Take 1 tablet (0.25 mg total) by mouth daily as needed. 8 tablet 2   ALPRAZolam  (XANAX ) 0.25 MG tablet Take 1 tablet (0.25 mg  total) by mouth daily as needed. 8 tablet 2   ALPRAZolam  (XANAX ) 0.25 MG tablet Take 1 tablet (0.25 mg total) by mouth daily as needed. 8 tablet 2   ALPRAZolam  (XANAX ) 0.25 MG tablet Take 1 tablet (0.25 mg total) by mouth daily as needed. 8 tablet 2   amLODipine  (NORVASC ) 2.5 MG tablet Take 1 tablet (2.5 mg total) by mouth daily. 90 tablet 1   citalopram  (CELEXA ) 10 MG tablet Take 1.5 tablets (15 mg total) by mouth daily. 135 tablet 0   citalopram  (CELEXA ) 10 MG tablet Take 1.5 tablets (15 mg total) by mouth daily. 135 tablet 1   estradiol  (ESTRACE ) 0.1 MG/GM vaginal cream Place 1 g vaginally at bedtime. FOR 14 DAYS, then twice weekly thereafter 42.5 g 0   estradiol  (VIVELLE -DOT) 0.025 MG/24HR Place 1 patch onto the skin 2 (two) times a week. 8 patch 1   fluticasone  (FLONASE ) 50 MCG/ACT nasal spray Place 2 sprays into both nostrils daily. 16 g 0   naproxen  (NAPROSYN ) 500 MG tablet Take 1 tablet (500 mg total) by mouth 2 (two) times daily with a meal. 30 tablet 0   ondansetron  (ZOFRAN ) 4 MG tablet Take 1 tablet (4 mg total) by mouth every 8 (eight) hours as needed for nausea or vomiting. 20 tablet 0   polyethylene glycol (MIRALAX  / GLYCOLAX ) packet Take 17 g by mouth daily as needed for mild constipation.     Probiotic Product (PROBIOTIC DAILY PO) Take 2 capsules by  mouth daily with supper. Gummie     promethazine -dextromethorphan (PROMETHAZINE -DM) 6.25-15 MG/5ML syrup Take 5 mLs by mouth 4 (four) times daily as needed. 118 mL 0   No current facility-administered medications on file prior to visit.     ROS see history of present illness  Objective  Physical Exam Vitals:   08/14/23 1014  BP: 120/82  Pulse: 79  Temp: 98.4 F (36.9 C)  SpO2: 98%    BP Readings from Last 3 Encounters:  08/14/23 120/82  12/11/22 120/70  10/01/22 118/68   Wt Readings from Last 3 Encounters:  08/14/23 114 lb (51.7 kg)  12/11/22 114 lb 8 oz (51.9 kg)  10/01/22 122 lb 3.2 oz (55.4 kg)    Physical  Exam Constitutional:      General: She is not in acute distress.    Appearance: Normal appearance.  HENT:     Head: Normocephalic.     Right Ear: Tympanic membrane normal.     Left Ear: Tympanic membrane normal.     Nose: Congestion present.     Right Sinus: Maxillary sinus tenderness present.     Left Sinus: Maxillary sinus tenderness present.     Mouth/Throat:     Mouth: Mucous membranes are moist.     Pharynx: Oropharynx is clear.  Eyes:     Conjunctiva/sclera: Conjunctivae normal.     Pupils: Pupils are equal, round, and reactive to light.  Cardiovascular:     Rate and Rhythm: Normal rate and regular rhythm.     Heart sounds: Normal heart sounds.  Pulmonary:     Effort: Pulmonary effort is normal.     Breath sounds: Normal breath sounds.  Lymphadenopathy:     Cervical: No cervical adenopathy.  Skin:    General: Skin is warm and dry.  Neurological:     General: No focal deficit present.     Mental Status: She is alert.  Psychiatric:        Mood and Affect: Mood normal.        Behavior: Behavior normal.      Assessment/Plan: Please see individual problem list.  Acute non-recurrent maxillary sinusitis Assessment & Plan: She presents with acute sinusitis characterized by cough, congestion, and drainage. Her COVID test is negative. She is allergic to penicillin and sulfa drugs, and doxycycline  is not preferred due to side effects. Prescribe azithromycin  (Z-Pak). Continue current cough medicine and Flonase  if desired, but be aware it may cause epistaxis. Use saline nasal spray as needed. Consider Mucinex during the day for congestion. Advise staying hydrated. Consider steroids if symptoms do not improve. Return precautions given to patient.   Orders: -     Azithromycin ; Take 2 tablets on day 1, then 1 tablet daily on days 2 through 5  Dispense: 6 tablet; Refill: 0  Acute cough -     POC COVID-19 BinaxNow    Return if symptoms worsen or fail to improve.   Leron Glance, NP-C [facility]

## 2023-08-20 ENCOUNTER — Encounter: Payer: Self-pay | Admitting: Nurse Practitioner

## 2023-08-20 NOTE — Assessment & Plan Note (Signed)
 She presents with acute sinusitis characterized by cough, congestion, and drainage. Her COVID test is negative. She is allergic to penicillin and sulfa drugs, and doxycycline  is not preferred due to side effects. Prescribe azithromycin  (Z-Pak). Continue current cough medicine and Flonase  if desired, but be aware it may cause epistaxis. Use saline nasal spray as needed. Consider Mucinex during the day for congestion. Advise staying hydrated. Consider steroids if symptoms do not improve. Return precautions given to patient.

## 2023-08-29 ENCOUNTER — Other Ambulatory Visit: Payer: Self-pay

## 2023-08-29 DIAGNOSIS — F4312 Post-traumatic stress disorder, chronic: Secondary | ICD-10-CM | POA: Diagnosis not present

## 2023-08-29 DIAGNOSIS — F4001 Agoraphobia with panic disorder: Secondary | ICD-10-CM | POA: Diagnosis not present

## 2023-08-29 DIAGNOSIS — F411 Generalized anxiety disorder: Secondary | ICD-10-CM | POA: Diagnosis not present

## 2023-08-29 DIAGNOSIS — F330 Major depressive disorder, recurrent, mild: Secondary | ICD-10-CM | POA: Diagnosis not present

## 2023-08-29 MED ORDER — CITALOPRAM HYDROBROMIDE 10 MG PO TABS
15.0000 mg | ORAL_TABLET | Freq: Every day | ORAL | 1 refills | Status: AC
  Filled 2023-08-29: qty 135, 90d supply, fill #0

## 2023-08-29 MED ORDER — ALPRAZOLAM 0.25 MG PO TABS
0.2500 mg | ORAL_TABLET | Freq: Every day | ORAL | 2 refills | Status: AC | PRN
  Filled 2023-08-29: qty 8, 8d supply, fill #0

## 2023-11-25 ENCOUNTER — Ambulatory Visit: Payer: Self-pay

## 2023-11-25 ENCOUNTER — Other Ambulatory Visit: Payer: Self-pay

## 2023-11-25 ENCOUNTER — Encounter: Payer: Self-pay | Admitting: Family

## 2023-11-25 ENCOUNTER — Ambulatory Visit: Admitting: Family

## 2023-11-25 VITALS — BP 118/76 | HR 79 | Temp 98.2°F | Ht 62.0 in | Wt 109.2 lb

## 2023-11-25 DIAGNOSIS — J0100 Acute maxillary sinusitis, unspecified: Secondary | ICD-10-CM

## 2023-11-25 MED ORDER — AZITHROMYCIN 250 MG PO TABS
ORAL_TABLET | ORAL | 0 refills | Status: AC
  Filled 2023-11-25: qty 6, 5d supply, fill #0

## 2023-11-25 NOTE — Progress Notes (Signed)
 Assessment & Plan:  Acute non-recurrent maxillary sinusitis Assessment & Plan: Reassuring exam.  Nontoxic in appearance.  Waxing and waning presentation.  Shared my concern for secondary bacterial infection with return of symptoms.  Provided her with azithromycin .  She politely declines repeating chest x-ray at this time.  She will monitor for left-sided chest wall tenderness.  If symptoms persist, I strongly advised CXR.  Orders: -     Azithromycin ; Take 2 tablets (500 mg total) by mouth daily for 1 day, THEN 1 tablet (250 mg total) daily for 4 days.  Dispense: 6 tablet; Refill: 0     Return precautions given.   Risks, benefits, and alternatives of the medications and treatment plan prescribed today were discussed, and patient expressed understanding.   Education regarding symptom management and diagnosis given to patient on AVS either electronically or printed.  No follow-ups on file.  Rollene Northern, FNP  Subjective:    Patient ID: Connie Francis, female    DOB: 1970/07/23, 52 y.o.   MRN: [id number]  CC: Connie Francis is a 53 y.o. female who presents today for an acute visit.    HPI: HPI Discussed the use of AI scribe software for clinical note transcription with the patient, who gave verbal consent to proceed.  History of Present Illness   Connie Francis is a 53 year old female who presents with facial pressure.  Her symptoms began last Thursday with a scratchy throat, congestion, and loose stools.  Loose stools have resolved.   by Saturday, she slept most of the day, and on Sunday, she felt somewhat better. However, on Monday, while working from home, her symptoms seemed to return, including pressure in her face and a headache. She describes facial pressure and a headache.  She experiences pain in her chest wall left side when taking a deep breath. Denies CP,  shortness of breath. No urinary symptoms, fevers, or chills, ear pain. She has been using  fluticasone , Nyquil at night, and Mucinex D during the day to manage her symptoms.  she denies any abdominal pain. She has a history of pneumonia, which occurred a long time ago, and mentions a deviated septum that sometimes causes more runny symptoms on one side.  She experiences a dry, hacky cough, mostly at night when lying down, with some occurrence during the day.      Treated for sinusitis 08/14/2023 azithromycin   history of IBS, hypertension, anxiety and depression No history of CKD Allergies: Latex, Lisinopril, Penicillins, Shellfish allergy, Sulfa antibiotics, and Lamictal [lamotrigine] Current Outpatient Medications on File Prior to Visit  Medication Sig Dispense Refill   ALPRAZolam  (XANAX ) 0.25 MG tablet Take 1 tablet (0.25 mg total) by mouth daily as needed. 8 tablet 2   ALPRAZolam  (XANAX ) 0.25 MG tablet Take 1 tablet (0.25 mg total) by mouth daily as needed. 8 tablet 2   ALPRAZolam  (XANAX ) 0.25 MG tablet Take 1 tablet (0.25 mg total) by mouth daily as needed. 8 tablet 2   ALPRAZolam  (XANAX ) 0.25 MG tablet Take 1 tablet (0.25 mg total) by mouth daily as needed. 8 tablet 2   ALPRAZolam  (XANAX ) 0.25 MG tablet Take 1 tablet (0.25 mg total) by mouth daily as needed. 8 tablet 2   ALPRAZolam  (XANAX ) 0.25 MG tablet Take 1 tablet (0.25 mg total) by mouth daily as needed. 8 tablet 2   amLODipine  (NORVASC ) 2.5 MG tablet Take 1 tablet (2.5 mg total) by mouth daily. 90 tablet 1   citalopram  (CELEXA ) 10 MG  tablet Take 1.5 tablets (15 mg total) by mouth daily. 135 tablet 0   citalopram  (CELEXA ) 10 MG tablet Take 1.5 tablets (15 mg total) by mouth daily. 135 tablet 1   citalopram  (CELEXA ) 10 MG tablet Take 1.5 tablets (15 mg total) by mouth daily. 135 tablet 1   estradiol  (ESTRACE ) 0.1 MG/GM vaginal cream Place 1 g vaginally at bedtime. FOR 14 DAYS, then twice weekly thereafter 42.5 g 0   estradiol  (VIVELLE -DOT) 0.025 MG/24HR Place 1 patch onto the skin 2 (two) times a week. 8 patch 1    ondansetron  (ZOFRAN ) 4 MG tablet Take 1 tablet (4 mg total) by mouth every 8 (eight) hours as needed for nausea or vomiting. 20 tablet 0   polyethylene glycol (MIRALAX  / GLYCOLAX ) packet Take 17 g by mouth daily as needed for mild constipation.     Probiotic Product (PROBIOTIC DAILY PO) Take 2 capsules by mouth daily with supper. Gummie     fluticasone  (FLONASE ) 50 MCG/ACT nasal spray Place 2 sprays into both nostrils daily. 16 g 0   naproxen  (NAPROSYN ) 500 MG tablet Take 1 tablet (500 mg total) by mouth 2 (two) times daily with a meal. 30 tablet 0   promethazine -dextromethorphan (PROMETHAZINE -DM) 6.25-15 MG/5ML syrup Take 5 mLs by mouth 4 (four) times daily as needed. 118 mL 0   No current facility-administered medications on file prior to visit.    Review of Systems  Constitutional:  Negative for chills and fever.  HENT:  Positive for congestion, sinus pain and sore throat.   Respiratory:  Positive for cough. Negative for shortness of breath and wheezing.   Cardiovascular:  Negative for chest pain and palpitations.  Gastrointestinal:  Negative for diarrhea, nausea and vomiting. Blood in stool: resolved.     Objective:    BP 118/76   Pulse 79   Temp 98.2 F (36.8 C) (Oral)   Ht 5' 2 (1.575 m)   Wt 109 lb 3.2 oz (49.5 kg)   SpO2 98%   BMI 19.97 kg/m   BP Readings from Last 3 Encounters:  11/25/23 118/76  08/14/23 120/82  12/11/22 120/70   Wt Readings from Last 3 Encounters:  11/25/23 109 lb 3.2 oz (49.5 kg)  08/14/23 114 lb (51.7 kg)  12/11/22 114 lb 8 oz (51.9 kg)    Physical Exam Vitals reviewed.  Constitutional:      Appearance: She is well-developed.  HENT:     Head: Normocephalic and atraumatic.     Right Ear: Hearing, tympanic membrane, ear canal and external ear normal. No decreased hearing noted. No drainage, swelling or tenderness. No middle ear effusion. No foreign body. Tympanic membrane is not erythematous or bulging.     Left Ear: Hearing, tympanic  membrane, ear canal and external ear normal. No decreased hearing noted. No drainage, swelling or tenderness.  No middle ear effusion. No foreign body. Tympanic membrane is not erythematous or bulging.     Nose: Nose normal. No rhinorrhea.     Right Sinus: No maxillary sinus tenderness or frontal sinus tenderness.     Left Sinus: No maxillary sinus tenderness or frontal sinus tenderness.     Mouth/Throat:     Pharynx: Uvula midline. No oropharyngeal exudate or posterior oropharyngeal erythema.     Tonsils: No tonsillar abscesses.  Eyes:     Conjunctiva/sclera: Conjunctivae normal.  Cardiovascular:     Rate and Rhythm: Regular rhythm.     Pulses: Normal pulses.     Heart sounds: Normal heart sounds.  Pulmonary:     Effort: Pulmonary effort is normal.     Breath sounds: Normal breath sounds. No wheezing, rhonchi or rales.  Chest:       Comments: No pain with deep inspiration.  Reproducible tenderness, left axillary side of chest wall.  No rash, bony step-off, edema or hematoma Lymphadenopathy:     Head:     Right side of head: No submental, submandibular, tonsillar, preauricular, posterior auricular or occipital adenopathy.     Left side of head: No submental, submandibular, tonsillar, preauricular, posterior auricular or occipital adenopathy.     Cervical: No cervical adenopathy.  Skin:    General: Skin is warm and dry.  Neurological:     Mental Status: She is alert.  Psychiatric:        Speech: Speech normal.        Behavior: Behavior normal.        Thought Content: Thought content normal.

## 2023-11-25 NOTE — Telephone Encounter (Signed)
 FYI Only or Action Required?: FYI only for provider: appointment scheduled on 11/25/2023.  Patient was last seen in primary care on 08/14/2023 by Gretel App, NP.  Called Nurse Triage reporting Nasal Congestion.  Symptoms began several days ago.  Interventions attempted: OTC medications: mucinex.  Symptoms are: gradually worsening.  Triage Disposition: See Physician Within 24 Hours  Patient/caregiver understands and will follow disposition?: Yes  Copied from CRM #[id number]. Topic: Clinical - Red Word Triage >> Nov 25, 2023  9:32 AM Suzen RAMAN wrote: Red Word that prompted transfer to Nurse Triage: congestion and rib pain when breathing. Requesting an acute/sick visit Reason for Disposition  [1] Fever returns after gone for over 24 hours AND [2] symptoms worse or not improved  Answer Assessment - Initial Assessment Questions 1. ONSET: When did the nasal discharge start?      4 days ago 2. AMOUNT: How much discharge is there?      Clear, and like a faucet 3. COUGH: Do you have a cough? If Yes, ask: Describe the color of your mucus. (e.g., clear, white, yellow, green)     Non productive cough 4. RESPIRATORY DISTRESS: Describe your breathing.      No distress 5. FEVER: Do you have a fever? If Yes, ask: What is your temperature, how was it measured, and when did it start?     Feels like low grad fever at night 6. SEVERITY: Overall, how bad are you feeling right now? (e.g., doesn't interfere with normal activities, staying home from school/work, staying in bed)      Feeling worse today than yesterday 7. OTHER SYMPTOMS: Do you have any other symptoms? (e.g., earache, mouth sores, sore throat, wheezing)     Left rib pain with deep breaths  Protocols used: Common Cold-A-AH

## 2023-11-25 NOTE — Patient Instructions (Signed)
 Start Zpak Continue probiotics. Please let me know if chest wall tenderness persist or certainly gets worse.  I would strongly recommend chest x-ray.

## 2023-11-25 NOTE — Assessment & Plan Note (Signed)
 Reassuring exam.  Nontoxic in appearance.  Waxing and waning presentation.  Shared my concern for secondary bacterial infection with return of symptoms.  Provided her with azithromycin .  She politely declines repeating chest x-ray at this time.  She will monitor for left-sided chest wall tenderness.  If symptoms persist, I strongly advised CXR.

## 2023-11-25 NOTE — Telephone Encounter (Signed)
 Pt is scheduled to see Rollene Northern, NP.

## 2023-11-27 ENCOUNTER — Encounter: Payer: Self-pay | Admitting: Family

## 2023-11-28 NOTE — Telephone Encounter (Signed)
 Note:

## 2023-12-03 NOTE — Telephone Encounter (Signed)
 open in error

## 2023-12-09 ENCOUNTER — Other Ambulatory Visit: Payer: Self-pay | Admitting: Internal Medicine

## 2023-12-10 ENCOUNTER — Other Ambulatory Visit: Payer: Self-pay

## 2023-12-10 ENCOUNTER — Other Ambulatory Visit: Payer: Self-pay | Admitting: Internal Medicine

## 2023-12-10 MED ORDER — AMLODIPINE BESYLATE 2.5 MG PO TABS
2.5000 mg | ORAL_TABLET | Freq: Every day | ORAL | 3 refills | Status: AC
  Filled 2023-12-10: qty 90, 90d supply, fill #0

## 2023-12-15 ENCOUNTER — Other Ambulatory Visit: Payer: Self-pay | Admitting: Medical Genetics

## 2023-12-15 ENCOUNTER — Encounter: Payer: Self-pay | Admitting: Internal Medicine

## 2023-12-15 DIAGNOSIS — D649 Anemia, unspecified: Secondary | ICD-10-CM

## 2023-12-15 DIAGNOSIS — I10 Essential (primary) hypertension: Secondary | ICD-10-CM

## 2023-12-15 DIAGNOSIS — R7301 Impaired fasting glucose: Secondary | ICD-10-CM

## 2023-12-15 DIAGNOSIS — E785 Hyperlipidemia, unspecified: Secondary | ICD-10-CM

## 2023-12-15 DIAGNOSIS — Z79899 Other long term (current) drug therapy: Secondary | ICD-10-CM

## 2023-12-17 ENCOUNTER — Inpatient Hospital Stay
Admission: RE | Admit: 2023-12-17 | Discharge: 2023-12-17 | Payer: Self-pay | Attending: Medical Genetics | Admitting: Medical Genetics

## 2023-12-17 ENCOUNTER — Other Ambulatory Visit
Admission: RE | Admit: 2023-12-17 | Discharge: 2023-12-17 | Disposition: A | Attending: Internal Medicine | Admitting: Internal Medicine

## 2023-12-17 DIAGNOSIS — R7301 Impaired fasting glucose: Secondary | ICD-10-CM | POA: Insufficient documentation

## 2023-12-17 DIAGNOSIS — I10 Essential (primary) hypertension: Secondary | ICD-10-CM | POA: Insufficient documentation

## 2023-12-17 DIAGNOSIS — E785 Hyperlipidemia, unspecified: Secondary | ICD-10-CM | POA: Diagnosis present

## 2023-12-17 DIAGNOSIS — Z79899 Other long term (current) drug therapy: Secondary | ICD-10-CM | POA: Diagnosis present

## 2023-12-17 DIAGNOSIS — D649 Anemia, unspecified: Secondary | ICD-10-CM | POA: Diagnosis present

## 2023-12-17 LAB — LIPID PANEL
Cholesterol: 193 mg/dL (ref 0–200)
HDL: 73 mg/dL (ref >40)
LDL Cholesterol: 113 mg/dL — ABNORMAL HIGH (ref 0–99)
Total CHOL/HDL Ratio: 2.6 ratio
Triglycerides: 33 mg/dL (ref <150)
VLDL: 7 mg/dL (ref 0–40)

## 2023-12-17 LAB — CBC WITH DIFFERENTIAL/PLATELET
Abs Immature Granulocytes: 0.02 K/uL (ref 0.00–0.07)
Basophils Absolute: 0 K/uL (ref 0.0–0.1)
Basophils Relative: 0 %
Eosinophils Absolute: 0.1 K/uL (ref 0.0–0.5)
Eosinophils Relative: 2 %
HCT: 36.7 % (ref 36.0–46.0)
Hemoglobin: 12.2 g/dL (ref 12.0–15.0)
Immature Granulocytes: 0 %
Lymphocytes Relative: 43 %
Lymphs Abs: 2 K/uL (ref 0.7–4.0)
MCH: 30 pg (ref 26.0–34.0)
MCHC: 33.2 g/dL (ref 30.0–36.0)
MCV: 90.2 fL (ref 80.0–100.0)
Monocytes Absolute: 0.5 K/uL (ref 0.1–1.0)
Monocytes Relative: 10 %
Neutro Abs: 2.1 K/uL (ref 1.7–7.7)
Neutrophils Relative %: 45 %
Platelets: 250 K/uL (ref 150–400)
RBC: 4.07 MIL/uL (ref 3.87–5.11)
RDW: 12.1 % (ref 11.5–15.5)
WBC: 4.7 K/uL (ref 4.0–10.5)
nRBC: 0 % (ref 0.0–0.2)

## 2023-12-17 LAB — TSH: TSH: 0.906 u[IU]/mL (ref 0.350–4.500)

## 2023-12-17 LAB — COMPREHENSIVE METABOLIC PANEL WITH GFR
ALT: 14 U/L (ref 0–44)
AST: 19 U/L (ref 15–41)
Albumin: 4.6 g/dL (ref 3.5–5.0)
Alkaline Phosphatase: 88 U/L (ref 38–126)
Anion gap: 12 (ref 5–15)
BUN: 17 mg/dL (ref 6–20)
CO2: 27 mmol/L (ref 22–32)
Calcium: 9.5 mg/dL (ref 8.9–10.3)
Chloride: 103 mmol/L (ref 98–111)
Creatinine, Ser: 0.62 mg/dL (ref 0.44–1.00)
GFR, Estimated: >60 mL/min (ref >60)
Glucose, Bld: 88 mg/dL (ref 70–99)
Potassium: 3.9 mmol/L (ref 3.5–5.1)
Sodium: 142 mmol/L (ref 135–145)
Total Bilirubin: 0.3 mg/dL (ref 0.0–1.2)
Total Protein: 7.4 g/dL (ref 6.5–8.1)

## 2023-12-17 LAB — HEMOGLOBIN A1C
Hgb A1c MFr Bld: 5.1 % (ref 4.8–5.6)
Mean Plasma Glucose: 100 mg/dL

## 2023-12-17 NOTE — Addendum Note (Signed)
 Addended by: Yuritzy Zehring C on: 12/17/2023 07:59 AM   Modules accepted: Orders

## 2023-12-18 LAB — MICROALBUMIN / CREATININE URINE RATIO
Creatinine, Urine: 34.8 mg/dL
Microalb Creat Ratio: <9 mg/g{creat} (ref 0–29)
Microalb, Ur: <3 ug/mL — ABNORMAL HIGH

## 2023-12-18 LAB — LDL CHOLESTEROL, DIRECT: Direct LDL: 109 mg/dL — ABNORMAL HIGH (ref 0–99)

## 2023-12-19 ENCOUNTER — Ambulatory Visit: Admitting: Internal Medicine

## 2023-12-19 ENCOUNTER — Encounter: Payer: Self-pay | Admitting: Internal Medicine

## 2023-12-19 ENCOUNTER — Other Ambulatory Visit: Payer: Self-pay

## 2023-12-19 VITALS — BP 130/68 | HR 68 | Ht 62.0 in | Wt 116.8 lb

## 2023-12-19 DIAGNOSIS — Z0000 Encounter for general adult medical examination without abnormal findings: Secondary | ICD-10-CM

## 2023-12-19 DIAGNOSIS — K5909 Other constipation: Secondary | ICD-10-CM

## 2023-12-19 DIAGNOSIS — I10 Essential (primary) hypertension: Secondary | ICD-10-CM | POA: Diagnosis not present

## 2023-12-19 MED ORDER — ESTRADIOL 0.025 MG/24HR TD PTTW
1.0000 | MEDICATED_PATCH | TRANSDERMAL | 1 refills | Status: AC
  Filled 2023-12-19: qty 8, 28d supply, fill #0

## 2023-12-19 NOTE — Patient Instructions (Addendum)
 For the constipation:  Try combining  daily colace  (100 to 200 mg max) with daily benefiber   Use miralax  every 3rd day   TRY starting the day with a GUZZLE  of 16 ounces of water  (room temp)  Your CBC, thyroid  , cholesterol, liver and kidney function are normal.   Please continue your current medications and plan to repeat fasting labs in 6 months.   We are switching you back to estrogen patches.  We can increase the dose if needed; just send me a mychart message in a few weeks if we need to change the dose   Please check your BP once a month and send me 3 readings   You are SO OVERDUE for your tetanus-diptheria-pertussis vaccine   (TDaP)  IT ISN'T FUNNY!   Please get this done in January either here or at your pharmacy !

## 2023-12-19 NOTE — Progress Notes (Signed)
 Patient ID: Connie Francis, female    DOB: 23-Jan-1970  Age: 53 y.o. MRN: [id number]  The patient is here for annual preventive examination and management of other chronic and acute problems.   The risk factors are reflected in the social history.   The roster of all physicians providing medical care to patient - is listed in the Snapshot section of the chart.   Activities of daily living:  The patient is 100% independent in all ADLs: dressing, toileting, feeding as well as independent mobility   Home safety : The patient has smoke detectors in the home. They wear seatbelts.  There are no unsecured firearms at home. There is no violence in the home.    There is no risks for hepatitis, STDs or HIV. There is no   history of blood transfusion. They have no travel history to infectious disease endemic areas of the world.   The patient has seen their dentist in the last six month. They have seen their eye doctor in the last year. The patinet  denies slight hearing difficulty with regard to whispered voices and some television programs.  They have deferred audiologic testing in the last year.  They do not  have excessive sun exposure. Discussed the need for sun protection: hats, long sleeves and use of sunscreen if there is significant sun exposure.    Diet: the importance of a healthy diet is discussed. They do have a healthy diet.   The benefits of regular aerobic exercise were discussed. The patient  exercises  3 to 5 days per week  for  60 minutes.    Depression screen: there are no signs or vegative symptoms of depression- irritability, change in appetite, anhedonia, sadness/tearfullness.   The following portions of the patient's history were reviewed and updated as appropriate: allergies, current medications, past family history, past medical history,  past surgical history, past social history  and problem list.   Visual acuity was not assessed per patient preference since the patient has  regular follow up with an  ophthalmologist. Hearing and body mass index were assessed and reviewed.    During the course of the visit the patient was educated and counseled about appropriate screening and preventive services including : fall prevention , diabetes screening, nutrition counseling, colorectal cancer screening, and recommended immunizations.    Chief Complaint:    Chronic constipation: using gummy bear fibers  and miralax  prn.  Having fecal urgency with incontinence.   Review of Symptoms  Patient denies headache, fevers, malaise, unintentional weight loss, skin rash, eye pain, sinus congestion and sinus pain, sore throat, dysphagia,  hemoptysis , cough, dyspnea, wheezing, chest pain, palpitations, orthopnea, edema, abdominal pain, nausea, melena, diarrhea, constipation, flank pain, dysuria, hematuria, urinary  Frequency, nocturia, numbness, tingling, seizures,  Focal weakness, Loss of consciousness,  Tremor, insomnia, depression, anxiety, and suicidal ideation.    Physical Exam:  BP 130/68   Pulse 68   Ht 5' 2 (1.575 m)   Wt 116 lb 12.8 oz (53 kg)   SpO2 97%   BMI 21.36 kg/m    Physical Exam Vitals reviewed.  Constitutional:      General: She is not in acute distress.    Appearance: Normal appearance. She is well-developed and normal weight. She is not ill-appearing, toxic-appearing or diaphoretic.  HENT:     Head: Normocephalic.     Right Ear: Tympanic membrane, ear canal and external ear normal. There is no impacted cerumen.     Left Ear:  Tympanic membrane, ear canal and external ear normal. There is no impacted cerumen.     Nose: Nose normal.     Mouth/Throat:     Mouth: Mucous membranes are moist.     Pharynx: Oropharynx is clear.  Eyes:     General: No scleral icterus.       Right eye: No discharge.        Left eye: No discharge.     Conjunctiva/sclera: Conjunctivae normal.     Pupils: Pupils are equal, round, and reactive to light.  Neck:     Thyroid : No  thyromegaly.     Vascular: No carotid bruit or JVD.  Cardiovascular:     Rate and Rhythm: Normal rate and regular rhythm.     Heart sounds: Normal heart sounds.  Pulmonary:     Effort: Pulmonary effort is normal. No respiratory distress.     Breath sounds: Normal breath sounds.  Chest:  Breasts:    Breasts are symmetrical.     Right: Normal. No swelling, inverted nipple, mass, nipple discharge, skin change or tenderness.     Left: Normal. No swelling, inverted nipple, mass, nipple discharge, skin change or tenderness.  Abdominal:     General: Bowel sounds are normal.     Palpations: Abdomen is soft. There is no mass.     Tenderness: There is no abdominal tenderness. There is no guarding or rebound.  Musculoskeletal:        General: Normal range of motion.     Cervical back: Normal range of motion and neck supple.  Lymphadenopathy:     Cervical: No cervical adenopathy.     Upper Body:     Right upper body: No supraclavicular, axillary or pectoral adenopathy.     Left upper body: No supraclavicular, axillary or pectoral adenopathy.  Skin:    General: Skin is warm and dry.  Neurological:     General: No focal deficit present.     Mental Status: She is alert and oriented to person, place, and time. Mental status is at baseline.  Psychiatric:        Mood and Affect: Mood normal.        Behavior: Behavior normal.        Thought Content: Thought content normal.        Judgment: Judgment normal.     Assessment and Plan: Essential hypertension Assessment & Plan: she reports compliance with medication regimen    She is not using NSAIDs daily.   She has been checking her BP at home using a calibrated machine and reports that her most recent readings have been <  130/80 . Continue use of amlodipine  2.5 mg daily    Visit for preventive health examination Assessment & Plan: age appropriate education and counseling updated, referrals for preventative services and immunizations  addressed, dietary and smoking counseling addressed, most recent labs reviewed.  I have personally reviewed and have noted:   1) the patient's medical and social history 2) The pt's use of alcohol, tobacco, and illicit drugs 3) The patient's current medications and supplements 4) Functional ability including ADL's, fall risk, home safety risk, hearing and visual impairment 5) Diet and physical activities 6) Evidence for depression or mood disorder 7) The patient's height, weight, and BMI have been recorded in the chart   I have made referrals, and provided counseling and education based on review of the above    Chronic constipation Assessment & Plan: Encouraged to  increase the fiber in  her diet  by using BLF. : metamucil, fibercon, or citrucel daily to supplement her fiber. She was advised that she  can also combine them daily with colace   Other orders -     Estradiol ; Place 1 patch onto the skin 2 (two) times a week.  Dispense: 8 patch; Refill: 1    No follow-ups on file.  Verneita LITTIE Kettering, MD

## 2023-12-20 ENCOUNTER — Ambulatory Visit: Payer: Self-pay | Admitting: Internal Medicine

## 2023-12-20 NOTE — Assessment & Plan Note (Signed)
 Encouraged to  increase the fiber in her diet  by using BLF. : metamucil, fibercon, or citrucel daily to supplement her fiber. She was advised that she  can also combine them daily with colace

## 2023-12-20 NOTE — Assessment & Plan Note (Signed)
 she reports compliance with medication regimen    She is not using NSAIDs daily.   She has been checking her BP at home using a calibrated machine and reports that her most recent readings have been <  130/80 . Continue use of amlodipine 2.5 mg daily

## 2023-12-20 NOTE — Assessment & Plan Note (Signed)

## 2023-12-28 LAB — GENECONNECT MOLECULAR SCREEN: Genetic Analysis Overall Interpretation: NEGATIVE

## 2023-12-30 ENCOUNTER — Other Ambulatory Visit: Payer: Self-pay

## 2023-12-30 DIAGNOSIS — F4312 Post-traumatic stress disorder, chronic: Secondary | ICD-10-CM | POA: Diagnosis not present

## 2023-12-30 DIAGNOSIS — F4001 Agoraphobia with panic disorder: Secondary | ICD-10-CM | POA: Diagnosis not present

## 2023-12-30 DIAGNOSIS — F411 Generalized anxiety disorder: Secondary | ICD-10-CM | POA: Diagnosis not present

## 2023-12-30 DIAGNOSIS — F330 Major depressive disorder, recurrent, mild: Secondary | ICD-10-CM | POA: Diagnosis not present

## 2023-12-30 MED ORDER — ALPRAZOLAM 0.25 MG PO TABS
0.2500 mg | ORAL_TABLET | Freq: Every day | ORAL | 2 refills | Status: AC | PRN
  Filled 2023-12-30: qty 8, 8d supply, fill #0

## 2023-12-30 MED ORDER — CITALOPRAM HYDROBROMIDE 10 MG PO TABS
15.0000 mg | ORAL_TABLET | Freq: Every day | ORAL | 1 refills | Status: AC
  Filled 2023-12-30: qty 135, 90d supply, fill #0

## 2024-01-30 ENCOUNTER — Ambulatory Visit (INDEPENDENT_AMBULATORY_CARE_PROVIDER_SITE_OTHER)

## 2024-01-30 DIAGNOSIS — Z23 Encounter for immunization: Secondary | ICD-10-CM

## 2024-01-30 NOTE — Progress Notes (Addendum)
 After obtaining consent, and per orders of Dr. Marylynn, MD injection of T-dap vaccine given IM in L Deltoid by Doyce Croak, CMA. Patient tolerated injection well.

## 2024-02-18 ENCOUNTER — Other Ambulatory Visit: Payer: Self-pay | Admitting: Internal Medicine

## 2024-02-18 DIAGNOSIS — Z1231 Encounter for screening mammogram for malignant neoplasm of breast: Secondary | ICD-10-CM

## 2024-03-19 ENCOUNTER — Encounter

## 2024-12-24 ENCOUNTER — Encounter: Admitting: Internal Medicine
